# Patient Record
Sex: Male | Born: 1950
Health system: Southern US, Community
[De-identification: ages and names within clinical notes are randomized; demographics above are authoritative.]

## PROBLEM LIST (undated history)

## (undated) DIAGNOSIS — E782 Mixed hyperlipidemia: Secondary | ICD-10-CM

## (undated) DIAGNOSIS — H6121 Impacted cerumen, right ear: Secondary | ICD-10-CM

## (undated) DIAGNOSIS — E785 Hyperlipidemia, unspecified: Secondary | ICD-10-CM

## (undated) DIAGNOSIS — I2510 Atherosclerotic heart disease of native coronary artery without angina pectoris: Secondary | ICD-10-CM

## (undated) DIAGNOSIS — N529 Male erectile dysfunction, unspecified: Secondary | ICD-10-CM

## (undated) DIAGNOSIS — E663 Overweight: Secondary | ICD-10-CM

## (undated) DIAGNOSIS — IMO0001 Reserved for inherently not codable concepts without codable children: Secondary | ICD-10-CM

## (undated) DIAGNOSIS — R9389 Abnormal findings on diagnostic imaging of other specified body structures: Secondary | ICD-10-CM

## (undated) DIAGNOSIS — H579 Unspecified disorder of eye and adnexa: Secondary | ICD-10-CM

## (undated) DIAGNOSIS — M1990 Unspecified osteoarthritis, unspecified site: Secondary | ICD-10-CM

## (undated) DIAGNOSIS — E119 Type 2 diabetes mellitus without complications: Secondary | ICD-10-CM

## (undated) DIAGNOSIS — H269 Unspecified cataract: Secondary | ICD-10-CM

## (undated) DIAGNOSIS — Z720 Tobacco use: Secondary | ICD-10-CM

## (undated) DIAGNOSIS — T7840XA Allergy, unspecified, initial encounter: Secondary | ICD-10-CM

## (undated) DIAGNOSIS — D72829 Elevated white blood cell count, unspecified: Secondary | ICD-10-CM

## (undated) DIAGNOSIS — K219 Gastro-esophageal reflux disease without esophagitis: Secondary | ICD-10-CM

## (undated) DIAGNOSIS — F17200 Nicotine dependence, unspecified, uncomplicated: Secondary | ICD-10-CM

## (undated) DIAGNOSIS — I700 Atherosclerosis of aorta: Secondary | ICD-10-CM

## (undated) DIAGNOSIS — E786 Lipoprotein deficiency: Secondary | ICD-10-CM

## (undated) HISTORY — DX: Atherosclerotic heart disease of native coronary artery without angina pectoris: I25.10

## (undated) HISTORY — DX: Unspecified cataract: H26.9

## (undated) HISTORY — DX: Abnormal findings on diagnostic imaging of other specified body structures: R93.89

## (undated) HISTORY — PX: MOUTH SURGERY: SHX715

## (undated) HISTORY — DX: Tobacco use: Z72.0

## (undated) HISTORY — DX: Allergy, unspecified, initial encounter: T78.40XA

## (undated) HISTORY — DX: Atherosclerosis of aorta: I70.0

## (undated) HISTORY — DX: Mixed hyperlipidemia: E78.2

## (undated) HISTORY — PX: EYE SURGERY: SHX253

## (undated) HISTORY — DX: Type 2 diabetes mellitus without complications: E11.9

## (undated) HISTORY — DX: Hyperlipidemia, unspecified: E78.5

## (undated) HISTORY — DX: Unspecified disorder of eye and adnexa: H57.9

## (undated) HISTORY — DX: Reserved for inherently not codable concepts without codable children: IMO0001

## (undated) HISTORY — DX: Nicotine dependence, unspecified, uncomplicated: F17.200

## (undated) HISTORY — DX: Lipoprotein deficiency: E78.6

## (undated) HISTORY — DX: Overweight: E66.3

## (undated) HISTORY — PX: COLONOSCOPY: SHX174

## (undated) HISTORY — DX: Male erectile dysfunction, unspecified: N52.9

## (undated) HISTORY — DX: Impacted cerumen, right ear: H61.21

## (undated) HISTORY — DX: Elevated white blood cell count, unspecified: D72.829

## (undated) HISTORY — DX: Unspecified osteoarthritis, unspecified site: M19.90

## (undated) HISTORY — DX: Gastro-esophageal reflux disease without esophagitis: K21.9

---

## 2010-09-09 ENCOUNTER — Ambulatory Visit: Payer: Self-pay | Admitting: Family Medicine

## 2010-09-29 ENCOUNTER — Ambulatory Visit: Payer: Self-pay | Admitting: Family Medicine

## 2012-05-17 ENCOUNTER — Encounter: Payer: Self-pay | Admitting: Medical

## 2012-05-17 ENCOUNTER — Ambulatory Visit (INDEPENDENT_AMBULATORY_CARE_PROVIDER_SITE_OTHER): Payer: Managed Care, Other (non HMO) | Admitting: Medical

## 2012-05-17 VITALS — BP 130/80 | HR 60 | Temp 97.5°F | Resp 16 | Wt 188.0 lb

## 2012-05-17 DIAGNOSIS — H6090 Unspecified otitis externa, unspecified ear: Secondary | ICD-10-CM

## 2012-05-17 DIAGNOSIS — H60399 Other infective otitis externa, unspecified ear: Secondary | ICD-10-CM

## 2012-05-17 MED ORDER — SULFAMETHOXAZOLE-TRIMETHOPRIM 800-160 MG PO TABS: 1.0000 | ORAL_TABLET | Freq: Two times a day (BID) | ORAL | Status: AC

## 2012-05-17 MED ORDER — NEOMYCIN-POLYMYXIN-HC 3.5-10000-1 OT SUSP: 4.0000 [drp] | Freq: Four times a day (QID) | OTIC | Status: AC

## 2012-05-17 NOTE — Progress Notes (Signed)
Subjective:  Brent Espinoza is a 61 y.o. male who presents for evaluation of left ear pain. Symptoms have been present for 3 days. He also notes pain in left jaw, swelling and redness of ear. He does not have a history of ear infections. He does not have a history of recent swimming.   Objective:    BP 130/80  Pulse 60  Temp 97.5 F (36.4 C) (Oral)  Resp 16  Wt 188 lb (85.276 kg) General:  alert and cooperative  Right Ear: normal TMs bilaterally and right canal normal  Left Ear: left canal red, inflamed, with purulent discharge and tender with movement of pinna  Mouth:  lips, mucosa, and tongue normal; teeth and gums normal  Neck: no adenopathy, supple, symmetrical, trachea midline and thyroid not enlarged, symmetric, no tenderness/mass/nodules        Assessment and Plan:    Left otitis externa     Treatment: polytrim and bactrim. OTC analgesia as needed. Water exclusion from affected ear until symptoms resolve. Follow up in 5 days if symptoms not improving.

## 2012-05-17 NOTE — Patient Instructions (Signed)
Begin ear drop, 4 drops 4 times daily for a week  Begin Bactrim oral antibiotic, twice daily for 7-10 days  Use tylenol and alternate with ibuprofen every 4-6 hours for pain and inflammation.   Ibuprofen can be 200mg  OTC, 4 tablets at a time.    Call/return if worse or not improving in 3-5 days.

## 2013-10-13 ENCOUNTER — Encounter: Payer: Self-pay | Admitting: Internal Medicine

## 2014-01-25 ENCOUNTER — Ambulatory Visit (INDEPENDENT_AMBULATORY_CARE_PROVIDER_SITE_OTHER): Payer: Managed Care, Other (non HMO) | Admitting: Medical

## 2014-01-25 ENCOUNTER — Telehealth: Payer: Self-pay | Admitting: Family Medicine

## 2014-01-25 ENCOUNTER — Encounter: Payer: Self-pay | Admitting: Medical

## 2014-01-25 VITALS — BP 140/88 | HR 80 | Temp 98.0°F | Resp 18 | Wt 190.0 lb

## 2014-01-25 DIAGNOSIS — T148 Other injury of unspecified body region: Secondary | ICD-10-CM

## 2014-01-25 DIAGNOSIS — W57XXXA Bitten or stung by nonvenomous insect and other nonvenomous arthropods, initial encounter: Secondary | ICD-10-CM

## 2014-01-25 MED ORDER — DOXYCYCLINE HYCLATE 100 MG PO TABS: 100.0000 mg | ORAL_TABLET | Freq: Every day | ORAL | Status: DC

## 2014-01-25 NOTE — Progress Notes (Signed)
Subjective:   Brent Espinoza is a 63 y.o. male who presents for evaluation of tick bite 3 days ago involving the upper extremity, left upper arm.  Removed the tick in entirety that had been there possibly a day.  Rash started 3 days ago. Lesions are red, and raised in texture. Rash has not changed over time. Rash is pruritic. Associated symptoms: none. Patient denies: abdominal pain, arthralgia, fever, headache, irritability, myalgia and nausea. No prior hx/o lyme or RMSF  The following portions of the patient's history were reviewed and updated as appropriate: allergies, current medications, past family history, past medical history, past social history and problem list.  Review of Systems As in subjective above   Objective:   Gen: wd, wn, nad Skin: left upper arm with raised 1cm erythematous inflamed bite wound with central scab, no obvious tick present, no irritation, fluctuance or warmth, no other rash   Assessment:      Encounter Diagnosis  Name Primary?  . Tick bite Yes      Plan:   Discussed symptoms and exam findings.  No obvious lyme or RMSF today.  Prescribed: Doxycycline prophylactic dose  Advised cleaning with soap/water, hydrocortisone cream topically.  Watch for any worse symptoms or signs if not improving.  Discussed signs/symptoms of RMSF and Lyme.   Follow up prn

## 2014-01-25 NOTE — Telephone Encounter (Signed)
Pt called and he is at [facility] on [address].  I think he confused [place] on where to send his rx.  Something about if just one time refill to [facility] but if more to [facility] and there is no way to know if he will need more than one dose.  So sent to [facility] on [address].  This is a one dose of 2 tablets.

## 2014-01-25 NOTE — Patient Instructions (Signed)
Thank you for giving me the opportunity to serve you today.    Your diagnosis today includes: Encounter Diagnosis  Name Primary?  . Tick bite Yes     Specific recommendations today include:  Use over-the-counter hydrocortisone cream topically as directed at the insect bite  Use soap and water to clean the wound regularly, daily  begin doxycycline prophylactic treatment  Watch for any worsening rash or symptoms such as worsening rash, target lesion, fever, nausea, headache, joint pains, significant fatigue  If not improving or new symptom    I have included other useful information below for your review.  Lyme Disease You may have been bitten by a tick and are to watch for the development of Lyme Disease. Lyme Disease is an infection that is caused by a bacteria The bacteria causing this disease is named Borreilia burgdorferi. If a tick is infected with this bacteria and then bites you, then Lyme Disease may occur. These ticks are carried by deer and rodents such as rabbits and mice and infest grassy as well as forested areas. Fortunately most tick bites do not cause Lyme Disease.  Lyme Disease is easier to prevent than to treat. First, covering your legs with clothing when walking in areas where ticks are possibly abundant will prevent their attachment because ticks tend to stay within inches of the ground. Second, using insecticides containing DEET can be applied on skin or clothing. Last, because it takes about 12 to 24 hours for the tick to transmit the disease after attachment to the human host, you should inspect your body for ticks twice a day when you are in areas where Lyme Disease is common. You must look thoroughly when searching for ticks. The Ixodes tick that carries Lyme Disease is very small. It is around the size of a sesame seed (picture of tick is not actual size). Removal is best done by grasping the tick by the head and pulling it out. Do not to squeeze the body of  the tick. This could inject the infecting bacteria into the bite site. Wash the area of the bite with an antiseptic solution after removal.  Lyme Disease is a disease that may affect many body systems. Because of the small size of the biting tick, most people do not notice being bitten. The first sign of an infection is usually a round red rash that extends out from the center of the tick bite. The center of the lesion may be blood colored (hemorrhagic) or have tiny blisters (vesicular). Most lesions have bright red outer borders and partial central clearing. This rash may extend out many inches in diameter, and multiple lesions may be present. Other symptoms such as fatigue, headaches, chills and fever, general achiness and swelling of lymph glands may also occur. If this first stage of the disease is left untreated, these symptoms may gradually resolve by themselves, or progressive symptoms may occur because of spread of infection to other areas of the body.  Follow up with your caregiver to have testing and treatment if you have a tick bite and you develop any of the above complaints. Your caregiver may recommend preventative (prophylactic) medications which kill bacteria (antibiotics). Once a diagnosis of Lyme Disease is made, antibiotic treatment is highly likely to cure the disease. Effective treatment of late stage Lyme Disease may require longer courses of antibiotic therapy.  MAKE SURE YOU:   Understand these instructions.  Will watch your condition.  Will get help right away if you are  not doing well or get worse. Document Released: 01/04/2001 Document Revised: 12/21/2011 Document Reviewed: 03/08/2009 [facility] Patient Information 2014 [place], [place].   [place] Spotted Fever [place] Spotted Fever (RMSF) is the oldest known tick-borne disease of people in the [place]. This disease was named because it was first described among people in the [facility] area who had an  illness characterized by a rash with red-purple-black spots. This disease is caused by a rickettsia (Rickettsia rickettsii), a bacteria carried by the tick. The [facility] wood tick and the American dog tick, acquire and transmit the RMSF bacteria (pictures NOT actual size). When a larval, nymphal or adult tick feeds on an infected rodent or larger animal, the tick can become infected. Infected adult ticks then feed on people who may then get RMSF. The tick transmits the disease to humans during a prolonged period of feeding that lasts many hours, days or even a couple weeks. The bite is painless and frequently goes unnoticed. An infected male tick may also pass the rickettsial bacteria to her eggs that then may mature to be infected adult ticks. The rickettsia that causes RMSF can also get into a person's body through damaged skin. A tick bite is not necessary. People can get RMSF if they crush a tick and get it's blood or body fluids on their skin through a small cut or sore.  DIAGNOSIS Diagnosis is made by laboratory tests.  TREATMENT Treatment is with antibiotics (medications that kill rickettsia and other bacteria). Immediate treatment usually prevents death. GEOGRAPHIC RANGE This disease was reported only in the [facility] until 1931. RMSF has more recently been described among individuals in all states except [place], [place] and [place]. The highest reported incidences of RMSF now occur among residents of [place], [place], [place] and the [place]. TIME OF YEAR  Most cases are diagnosed during late spring and summer when ticks are most active. However, especially in the warmer [place] states, a few cases occur during the winter. SYMPTOMS   Symptoms of RMSF begin from 2 to 14 days after a tick bite. The most common early symptoms are fever, muscle aches and headache followed by nausea (feeling sick to your stomach) or vomiting.  The RMSF rash is typically delayed until 3 or more  days after symptom onset, and eventually develops in 9 of 10 infected patients by the 5th day of illness. If the disease is not treated it can cause death. If you get a fever, headache, muscle aches, rash, nausea or vomiting within 2 weeks of a possible tick bite or exposure you should see your caregiver immediately. PREVENTION Ticks prefer to hide in shady, moist ground litter. They can often be found above the ground clinging to tall grass, brush, shrubs and low tree branches. They also inhabit lawns and gardens, especially at the edges of woodlands and around old stone walls. Within the areas where ticks generally live, no naturally vegetated area can be considered completely free of infected ticks. The best precaution against RMSF is to avoid contact with soil, leaf litter and vegetation as much as possible in tick infested areas. For those who enjoy gardening or walking in their yards, clear brush and mow tall grass around houses and at the edges of gardens. This may help reduce the tick population in the immediate area. Applications of chemical insecticides by a licensed professional in the spring (late May) and Fall (September) will also control ticks, especially in heavily infested areas. Treatment will never get rid  of all the ticks. Getting rid of small animal populations that host ticks will also decrease the tick population. When working in the garden, [employer], or handling soil and vegetation, wear light-colored protective clothing and gloves. Spot-check often to prevent ticks from reaching the skin. Ticks cannot jump or fly. They will not drop from an above-ground perch onto a passing animal. Once a tick gains access to human skin it climbs upward until it reaches a more protected area. For example, the back of the knee, groin, navel, armpit, ears or nape of the neck. It then begins the slow process of embedding itself in the skin. Campers, hikers, field workers, and others who spend time in  wooded, brushy or tall grassy areas can avoid exposure to ticks by using the following precautions:  Wear light-colored clothing with a tight weave to spot ticks more easily and prevent contact with the skin.  Wear long pants tucked into socks, long-sleeved shirts tucked into pants and enclosed shoes or boots along with insect repellent.  Spray clothes with insect repellent containing either DEET or Permethrin. Only DEET can be used on exposed skin. Follow the manufacturer's directions carefully.  Wear a hat and keep long hair pulled back.  Stay on cleared, well-worn trails whenever possible.  Spot-check yourself and others often for the presence of ticks on clothes. If you find one, there are likely to be others. Check thoroughly.  Remove clothes after leaving tick-infested areas. If possible, wash them to eliminate any unseen ticks. Check yourself, your children and any pets from head to toe for the presence of ticks.  Shower and shampoo. You can greatly reduce your chances of contracting RMSF if you remove attached ticks as soon as possible. Regular checks of the body, including all body sites covered by hair (head, armpits, genitals), allow removal of the tick before rickettsial transmission. To remove an attached tick, use a forceps or tweezers to detach the intact tick without leaving mouth parts in the skin. The tick bite wound should be cleansed after tick removal. Remember the most common symptoms of RMSF are fever, muscle aches, headache and nausea or vomiting with a later onset of rash. If you get these symptoms after a tick bite and while living in an area where RMSF is found, RMSF should be suspected. If the disease is not treated, it can cause death. See your caregiver immediately if you get these symptoms. Do this even if not aware of a tick bite. Document Released: 01/10/2001 Document Revised: 12/21/2011 Document Reviewed: 09/02/2009 [facility] Patient Information 2014 [place],  [place].

## 2014-08-21 ENCOUNTER — Encounter: Payer: Self-pay | Admitting: Medical

## 2014-08-21 ENCOUNTER — Ambulatory Visit (INDEPENDENT_AMBULATORY_CARE_PROVIDER_SITE_OTHER): Payer: Managed Care, Other (non HMO) | Admitting: Medical

## 2014-08-21 VITALS — BP 130/78 | HR 88 | Temp 97.7°F | Resp 14 | Wt 188.0 lb

## 2014-08-21 DIAGNOSIS — H65192 Other acute nonsuppurative otitis media, left ear: Secondary | ICD-10-CM

## 2014-08-21 DIAGNOSIS — Z720 Tobacco use: Secondary | ICD-10-CM

## 2014-08-21 DIAGNOSIS — Z23 Encounter for immunization: Secondary | ICD-10-CM

## 2014-08-21 DIAGNOSIS — L989 Disorder of the skin and subcutaneous tissue, unspecified: Secondary | ICD-10-CM

## 2014-08-21 DIAGNOSIS — Z7189 Other specified counseling: Secondary | ICD-10-CM

## 2014-08-21 DIAGNOSIS — F17200 Nicotine dependence, unspecified, uncomplicated: Secondary | ICD-10-CM

## 2014-08-21 MED ORDER — AZITHROMYCIN 250 MG PO TABS: ORAL_TABLET | ORAL | Status: DC

## 2014-08-21 NOTE — Progress Notes (Signed)
Subjective:   Brent Espinoza is a 63 y.o. male who presents with ear pain and possible ear infection. Symptoms include a 1+ week history of ear pain and possibly yellow ear drainage this morning.  He was on a plane 3 weeks ago out to [facility], thinks his symptoms may have got kicked off then. Has a history of otitis externa seen by me 2 years ago,and occasionally gets ear infections. He is a smoker  He has a second complaint of leg skin lesion of his right foot along the back of the heel. This skin lesions been there a week or 2. Has been red and raise up, thought was a little pustule. He tried wart remover for 3 days. No other aggravating or relieving factors.  He smokes about a half a pack a day  Last preventative medical care unknown  ROS as in subjective  Objective:  Filed Vitals:   08/21/14 1503  BP: 130/78  Pulse: 88  Temp: 97.7 F (36.5 C)  Resp: 14    General appearance: Alert, WD/WN, no distress                             Skin: right posterior lateral heel with 1.5 cm area of pink red coloration with central raised hard keratinized core, round raised growth, with some flaking of skin surrounding that, otherwise skin warm, no rash                           Head: no sinus tenderness                            Eyes: conjunctiva normal, corneas clear, PERRLA                            Ears: left TM with erythema, flat, right TM normal, external ear canals normal                          Nose: septum midline, turbinates swollen, with erythema and clear discharge             Mouth/throat: MMM, tongue normal, mild pharyngeal erythema                           Neck: supple, no adenopathy, no thyromegaly, nontender                          Heart: RRR, normal S1, S2, no murmurs                         Lungs: there were some scattered rhonchi in the right lung fields otherwise, no wheezes, rales     Assessment: Encounter Diagnoses  Name Primary?  . Acute nonsuppurative otitis  media of left ear Yes  . Smoker   . Skin lesion   . Flu vaccine need   . Counseling on health promotion and disease prevention   '   Plan: Otitis media-begin Z-Pak, plenty of water intake, suggested symptomatic OTC remedies.  Tylenol or Ibuprofen OTC for fever and malaise.  Call/return in 2-3 days if symptoms aren't resolving.   Smoker - discussed risk of tobacco use, recommended he consider  quitting, not ready to quit  Skin lesion-unclear, wart versus other.  He we use wart remover, her for another week or 2 but advise if not improving or not completely gone within 2 weeks should see dermatology.he will let me know within 2 weeks  Counseled on the influenza virus vaccine.  Vaccine information sheet given.  Influenza vaccine given after consent obtained.  Counseled on the pneumococcal vaccine which she will return to get in a few weeks  Strongly advise he come back for routine physical as he is well behind for this

## 2016-09-18 ENCOUNTER — Telehealth: Payer: Self-pay

## 2016-09-18 NOTE — Telephone Encounter (Signed)
Ok, do referral

## 2016-09-18 NOTE — Telephone Encounter (Signed)
Pt states that he needs authorization to get [facility] referral for eye doctor, Dr. Zadie Rhine. Please advise if ok to enter referral in acuity connects. Please call pt to notify if referral entered.  [phone]. Victorino December

## 2016-09-21 NOTE — Telephone Encounter (Signed)
Acuity referral entered in acuity connects.

## 2016-09-30 DIAGNOSIS — H34811 Central retinal vein occlusion, right eye, with macular edema: Secondary | ICD-10-CM | POA: Diagnosis not present

## 2016-11-16 DIAGNOSIS — H34811 Central retinal vein occlusion, right eye, with macular edema: Secondary | ICD-10-CM | POA: Diagnosis not present

## 2017-01-04 DIAGNOSIS — H35351 Cystoid macular degeneration, right eye: Secondary | ICD-10-CM | POA: Diagnosis not present

## 2017-01-04 DIAGNOSIS — H34811 Central retinal vein occlusion, right eye, with macular edema: Secondary | ICD-10-CM | POA: Diagnosis not present

## 2017-02-18 ENCOUNTER — Ambulatory Visit (INDEPENDENT_AMBULATORY_CARE_PROVIDER_SITE_OTHER): Payer: Medicare HMO | Admitting: Medical

## 2017-02-18 ENCOUNTER — Encounter: Payer: Self-pay | Admitting: Medical

## 2017-02-18 VITALS — BP 118/72 | HR 68 | Ht 68.0 in | Wt 181.2 lb

## 2017-02-18 DIAGNOSIS — Z7189 Other specified counseling: Secondary | ICD-10-CM | POA: Diagnosis not present

## 2017-02-18 DIAGNOSIS — Z125 Encounter for screening for malignant neoplasm of prostate: Secondary | ICD-10-CM | POA: Diagnosis not present

## 2017-02-18 DIAGNOSIS — Z0000 Encounter for general adult medical examination without abnormal findings: Secondary | ICD-10-CM | POA: Diagnosis not present

## 2017-02-18 DIAGNOSIS — Z131 Encounter for screening for diabetes mellitus: Secondary | ICD-10-CM | POA: Diagnosis not present

## 2017-02-18 DIAGNOSIS — Z1211 Encounter for screening for malignant neoplasm of colon: Secondary | ICD-10-CM | POA: Diagnosis not present

## 2017-02-18 DIAGNOSIS — Z122 Encounter for screening for malignant neoplasm of respiratory organs: Secondary | ICD-10-CM

## 2017-02-18 DIAGNOSIS — E663 Overweight: Secondary | ICD-10-CM | POA: Diagnosis not present

## 2017-02-18 DIAGNOSIS — H579 Unspecified disorder of eye and adnexa: Secondary | ICD-10-CM | POA: Insufficient documentation

## 2017-02-18 DIAGNOSIS — Z13220 Encounter for screening for lipoid disorders: Secondary | ICD-10-CM | POA: Diagnosis not present

## 2017-02-18 DIAGNOSIS — Z720 Tobacco use: Secondary | ICD-10-CM

## 2017-02-18 DIAGNOSIS — H6121 Impacted cerumen, right ear: Secondary | ICD-10-CM

## 2017-02-18 DIAGNOSIS — Z136 Encounter for screening for cardiovascular disorders: Secondary | ICD-10-CM | POA: Insufficient documentation

## 2017-02-18 DIAGNOSIS — Z7185 Encounter for immunization safety counseling: Secondary | ICD-10-CM

## 2017-02-18 HISTORY — DX: Tobacco use: Z72.0

## 2017-02-18 HISTORY — DX: Unspecified disorder of eye and adnexa: H57.9

## 2017-02-18 HISTORY — DX: Overweight: E66.3

## 2017-02-18 HISTORY — DX: Impacted cerumen, right ear: H61.21

## 2017-02-18 LAB — CBC
HEMATOCRIT: 45.6 % (ref 38.5–50.0)
Hemoglobin: 16.1 g/dL (ref 13.2–17.1)
MCH: 30.7 pg (ref 27.0–33.0)
MCHC: 35.3 g/dL (ref 32.0–36.0)
MCV: 86.9 fL (ref 80.0–100.0)
MPV: 9 fL (ref 7.5–12.5)
Platelets: 257 10*3/uL (ref 140–400)
RBC: 5.25 MIL/uL (ref 4.20–5.80)
RDW: 14.1 % (ref 11.0–15.0)
WBC: 13.2 10*3/uL — ABNORMAL HIGH (ref 4.0–10.5)

## 2017-02-18 NOTE — Progress Notes (Signed)
Subjective:    Brent Espinoza is a 66 y.o. male who presents for Preventative Services visit and chronic medical problems/med check visit.    Primary Care Provider Dorothea Ogle, PA-C here for primary care  Current Health Care Team:  Dentist, Dr. Victorino Dike  Eye doctor, Dr. Delman Cheadle and Dr. Zadie Rhine at [facility]  Dr. Allyn Kenner dermatology  Medical Services you may have received from other than [facility] providers in the past year (date may be approximate) Dermatology, eye doctor  Exercise Current exercise habits: some walking   Nutrition/Diet Current diet: in general, a "healthy" diet    Depression Screen Depression screen [facility] 2/9 02/18/2017  Decreased Interest 0  Down, Depressed, Hopeless 0  PHQ - 2 Score 0    Activities of Daily Living Screen/Functional Status Survey Is the patient deaf or have difficulty hearing?: No Does the patient have difficulty seeing, even when wearing glasses/contacts?: No Does the patient have difficulty concentrating, remembering, or making decisions?: No Does the patient have difficulty walking or climbing stairs?: No Does the patient have difficulty dressing or bathing?: No Does the patient have difficulty doing errands alone such as visiting a doctor's office or shopping?: No   Fall Risk Screen Fall Risk  02/18/2017  Falls in the past year? No    Gait Assessment: Normal gait observed yes  Advanced directives Does patient have a [place]? No Does patient have a Living Will? No  History reviewed. No pertinent past medical history.  Past Surgical History:  Procedure Laterality Date  . COLONOSCOPY     never as of 02/2017  . EYE SURGERY     gets therapy injections for vessel disease, hx/o cataract surgery    Social History   Social History  . Marital status: Divorced    Spouse name: N/A  . Number of children: N/A  . Years of education: N/A   Occupational History  . Not on file.   Social History Main Topics    . Smoking status: Current Every Day Smoker    Packs/day: 1.00    Years: 50.00    Types: Cigarettes  . Smokeless tobacco: Never Used  . Alcohol use Yes     Comment: occ  . Drug use: No  . Sexual activity: Not on file   Other Topics Concern  . Not on file   Social History Narrative   Alone.   Retired as of 2018.   Was at [employer].   Exercise - yard work, Stage manager around.  Eats relatively healthy.   Has 2 children, 1 in [place], 1 in [place], 4 grandchildren.  02/2017    Family History  Problem Relation Age of Onset  . Other Mother        eye disease, died of old age  . Other Father        health history unknown  . Cancer Neg Hx   . Diabetes Neg Hx   . Heart disease Neg Hx   . Stroke Neg Hx   . Hyperlipidemia Neg Hx   . Hypertension Neg Hx      Current Outpatient Prescriptions:  .  b complex vitamins capsule, Take 1 capsule by mouth daily., Disp: , Rfl:  .  naproxen sodium (ANAPROX) 220 MG tablet, Take 220 mg by mouth 2 (two) times daily with a meal., Disp: , Rfl:   Allergies  Allergen Reactions  . Penicillins     History reviewed: allergies, current medications, past family history, past medical history,  past social history, past surgical history and problem list  Chronic issues discussed: none  Acute issues discussed: Ear wax  Objective:      Biometrics BP 118/72   Pulse 68   Ht 5\' 8"  (1.727 m)   Wt 181 lb 3.2 oz (82.2 kg)   SpO2 96%   BMI 27.55 kg/m   Cognitive Testing  Alert? Yes  Normal Appearance?Yes  Oriented to person? Yes  Place? Yes   Time? Yes  Recall of three objects?  Yes  Can perform simple calculations? Yes  Displays appropriate judgment?Yes  Can read the correct time from a watch face?Yes  General appearance: alert, no distress, WD/WN, white male  Nutritional Status: Inadequate calore intake? no Loss of muscle mass? no Loss of fat beneath skin? no Localized or general edema? no Diminished functional status? no  Other  pertinent exam: HEENT: normocephalic, sclerae anicteric, impacted cerumen on right, moderate cerumen left, unable to visualize TMs, nares patent, no discharge or erythema, pharynx normal Skin: scattered macules, SK on right supraspinatus region and left upper back, no specific worrisome lesions, scar left cheek from prior biospy Oral cavity: MMM, no lesions Neck: supple, no lymphadenopathy, no thyromegaly, no masses, no bruits Heart: RRR, normal S1, S2, no murmurs Lungs: CTA bilaterally, no wheezes, rhonchi, or rales Abdomen: +bs, soft, non tender, non distended, no masses, no hepatomegaly, no splenomegaly Musculoskeletal: non tender, no swelling, no obvious deformity Extremities: no edema, no cyanosis, no clubbing Pulses: 2+ symmetric, upper and lower extremities, normal cap refill Neurological: alert, oriented x 3, CN2-12 intact, strength normal upper extremities and lower extremities, sensation normal throughout, DTRs 2+ throughout, no cerebellar signs, gait normal Psychiatric: normal affect, behavior normal, pleasant  GU: normal male, uncircumcised, no mass, no hernia, no lymphadenopathy DRE: refused  Assessment:   Encounter Diagnoses  Name Primary?  . Initial Medicare annual wellness visit Yes  . Eye disease   . Tobacco use   . Screening for diabetes mellitus   . Screening for lipid disorders   . Screen for colon cancer   . Screening for prostate cancer   . Vaccine counseling   . Overweight   . Screening for heart disease   . Screening for AAA (abdominal aortic aneurysm)   . Encounter for screening for lung cancer   . Impacted cerumen of right ear      Plan:   A preventative services visit was completed today.  During the course of the visit today, we discussed and counseled about appropriate screening and preventive services.  A health risk assessment was established today that included a review of current medications, allergies, social history, family history, medical and  preventative health history, biometrics, and preventative screenings to identify potential safety concerns or impairments.  A personalized plan was printed today for your records and use.   Personalized health advice and education was given today to reduce health risks and promote self management and wellness.  Information regarding end of life planning was discussed today.  Conditions/risks identified: Impacted cerumen  Chronic problems discussed today: Retinal/eye issues - managed by ophthalmoloty Tobacco use - advised cessation but he is not desiring to quit despite risks  Acute problems discussed today: Ear wax - return for lavage, gave some recommendations on home remedies as well  Recommendations:  I recommend a yearly ophthalmology/optometry visit for glaucoma screening and eye checkup  I recommended a yearly dental visit for hygiene and checkup  Advanced directives - discussed nature and purpose of Advanced Directives,  encouraged them to complete them if they have not done so and/or encouraged them to get [place] a copy if they have done this already.  Today we performed screening for heart with EKG, labs for diabetes and lipid screening  referral for AAA [place]  He declines colonoscopy, so referral for col guard  Prostate cancer screening discussed and performed today except rectal exam  Referrals today: AAA screening Cologuard  Immunizations: I recommended a yearly influenza vaccine, typically in September when the vaccine is usually available Is the Pneumococcal vaccine up to date: maybe, will request pharmacy records. Is the Shingles vaccine up to date: no.  Advised shingrix.  He will check insurance coverage Is the Td/Tdap vaccine up to date: yes, <10 years per patient   Medicare Attestation A preventative services visit was completed today.  During the course of the visit the patient was educated and counseled about appropriate screening and preventive services.  A  health risk assessment was established with the patient that included a review of current medications, allergies, social history, family history, medical and preventative health history, biometrics, and preventative screenings to identify potential safety concerns or impairments.  A personalized plan was printed today for the patient's records and use.   Personalized health advice and education was given today to reduce health risks and promote self management and wellness.  Information regarding end of life planning was discussed today.  Crisoforo Oxford, PA-C   02/18/2017

## 2017-02-19 ENCOUNTER — Ambulatory Visit (INDEPENDENT_AMBULATORY_CARE_PROVIDER_SITE_OTHER): Payer: Medicare HMO | Admitting: Medical

## 2017-02-19 ENCOUNTER — Encounter: Payer: Self-pay | Admitting: Medical

## 2017-02-19 ENCOUNTER — Other Ambulatory Visit: Payer: Self-pay | Admitting: Medical

## 2017-02-19 DIAGNOSIS — E782 Mixed hyperlipidemia: Secondary | ICD-10-CM

## 2017-02-19 DIAGNOSIS — E786 Lipoprotein deficiency: Secondary | ICD-10-CM | POA: Insufficient documentation

## 2017-02-19 DIAGNOSIS — H6121 Impacted cerumen, right ear: Secondary | ICD-10-CM

## 2017-02-19 DIAGNOSIS — Z720 Tobacco use: Secondary | ICD-10-CM

## 2017-02-19 DIAGNOSIS — D72829 Elevated white blood cell count, unspecified: Secondary | ICD-10-CM | POA: Insufficient documentation

## 2017-02-19 HISTORY — DX: Mixed hyperlipidemia: E78.2

## 2017-02-19 HISTORY — DX: Lipoprotein deficiency: E78.6

## 2017-02-19 HISTORY — DX: Elevated white blood cell count, unspecified: D72.829

## 2017-02-19 LAB — HEMOGLOBIN A1C
HEMOGLOBIN A1C: 5.8 % — AB (ref <5.7)
MEAN PLASMA GLUCOSE: 120 mg/dL

## 2017-02-19 LAB — LIPID PANEL
Cholesterol: 151 mg/dL (ref <200)
HDL: 28 mg/dL — ABNORMAL LOW (ref >40)
LDL CALC: 111 mg/dL — AB (ref <100)
TRIGLYCERIDES: 61 mg/dL (ref <150)
Total CHOL/HDL Ratio: 5.4 Ratio — ABNORMAL HIGH (ref <5.0)
VLDL: 12 mg/dL (ref <30)

## 2017-02-19 LAB — COMPREHENSIVE METABOLIC PANEL
ALBUMIN: 4.2 g/dL (ref 3.6–5.1)
ALK PHOS: 63 U/L (ref 40–115)
ALT: 17 U/L (ref 9–46)
AST: 22 U/L (ref 10–35)
BILIRUBIN TOTAL: 0.8 mg/dL (ref 0.2–1.2)
BUN: 13 mg/dL (ref 7–25)
CALCIUM: 9.1 mg/dL (ref 8.6–10.3)
CO2: 21 mmol/L (ref 20–31)
Chloride: 100 mmol/L (ref 98–110)
Creat: 0.93 mg/dL (ref 0.70–1.25)
Glucose, Bld: 70 mg/dL (ref 65–99)
Potassium: 4.6 mmol/L (ref 3.5–5.3)
Sodium: 137 mmol/L (ref 135–146)
Total Protein: 7 g/dL (ref 6.1–8.1)

## 2017-02-19 LAB — PSA: PSA: 1.7 ng/mL (ref <=4.0)

## 2017-02-19 MED ORDER — PRAVASTATIN SODIUM 20 MG PO TABS: 20.0000 mg | ORAL_TABLET | Freq: Every day | ORAL | 0 refills | Status: DC

## 2017-02-19 MED ORDER — ASPIRIN EC 81 MG PO TBEC: 81.0000 mg | DELAYED_RELEASE_TABLET | Freq: Every day | ORAL | 3 refills | Status: DC

## 2017-02-19 NOTE — Patient Instructions (Addendum)
  Thank you for giving me the opportunity to serve you today.    Your diagnosis today includes: Encounter Diagnoses  Name Primary?  . Initial Medicare annual wellness visit Yes  . Eye disease   . Tobacco use   . Screening for diabetes mellitus   . Screening for lipid disorders   . Screen for colon cancer   . Screening for prostate cancer   . Vaccine counseling   . Overweight   . Screening for heart disease   . Screening for AAA (abdominal aortic aneurysm)   . Encounter for screening for lung cancer   . Impacted cerumen of right ear     Recommendations:  I recommend a yearly ophthalmology/optometry visit for glaucoma screening and eye checkup  I recommended a yearly dental visit for hygiene and checkup  I recommend you have your attorney update your Last Will and Testament as well as prepare Advanced Directives including a Living Will and [place]  We will refer you for ultrasound to screen for Abdominal Aortic Aneurysm  We are referring you for Cologuard testing for Colon Cancer screening   Immunizations: I recommended a yearly influenza vaccine, typically in September when the vaccine is usually available I recommend you check insurance coverage for Pneumococcal and Shingrix vaccines Please get [place] a copy/date of the prior Pneumococcal vaccine you had at the pharmacy last year  Typically you get Prevnar 75 at age 69yo, then you get Pneumococcal 23 at age 23yo.  Eat a healthy low fat diet, get routine exercise  STOP SMOKING!

## 2017-02-19 NOTE — Progress Notes (Signed)
Subjective:   Here for complaint of earwax buildup in both ears, worse in right.  Symptoms include decreased hearing on the right.  He does report prior history of similar.  No other aggravating or relieving factors.    He to review lab results from yesterday  No other complaint.  Review of Systems Constitutional: denies fever, chills, sweats ENT: no runny nose, ear pain, sore throat, hoarseness, sinus pain, teeth pain, tinnitus Gastroenterology: denies nausea, vomiting     Objective:   Physical Exam  General appearance: alert, no distress, WD/WN Ears: right ear canal with impacted cerumen, left ear canal with moderate cerumen HENT: conjunctiva/corneas normal, sclerae anicteric, nares patent, no discharge or erythema, pharynx normal Oral cavity: MMM, tongue normal, teeth normal Neurological: Hearing normal bilaterally to whisper    Assessment & Plan:    Encounter Diagnoses  Name Primary?  . Mixed dyslipidemia Yes  . Tobacco use   . HDL deficiency   . Impacted cerumen of right ear     Reviewed labs from yesterday, recommendations, including beginning ASA and Pravachol QHS, WBC elevated, and at risk for diabetes.   discussed diet, exercise, and recommended he stop smoking.  Discussed findings.  Discussed risk/benefits of procedure and patient agrees to procedure. Successfully used warm water lavage to remove impacted cerumen from bilat ear canal. Patient tolerated procedure well. Advised they avoid using any cotton swabs or other devices to clean the ear canals.  Use basic hygiene as discussed.  Follow up prn.

## 2017-02-22 DIAGNOSIS — H34811 Central retinal vein occlusion, right eye, with macular edema: Secondary | ICD-10-CM | POA: Diagnosis not present

## 2017-02-22 DIAGNOSIS — H2512 Age-related nuclear cataract, left eye: Secondary | ICD-10-CM | POA: Diagnosis not present

## 2017-02-22 DIAGNOSIS — H35351 Cystoid macular degeneration, right eye: Secondary | ICD-10-CM | POA: Diagnosis not present

## 2017-02-22 DIAGNOSIS — H4720 Unspecified optic atrophy: Secondary | ICD-10-CM | POA: Diagnosis not present

## 2017-03-03 DIAGNOSIS — Z1212 Encounter for screening for malignant neoplasm of rectum: Secondary | ICD-10-CM | POA: Diagnosis not present

## 2017-03-03 DIAGNOSIS — Z1211 Encounter for screening for malignant neoplasm of colon: Secondary | ICD-10-CM | POA: Diagnosis not present

## 2017-03-09 ENCOUNTER — Ambulatory Visit
Admission: RE | Admit: 2017-03-09 | Discharge: 2017-03-09 | Disposition: A | Discharge status: Home / Self Care | Payer: Medicare HMO | Source: Ambulatory Visit | Attending: Medical | Admitting: Medical

## 2017-03-09 DIAGNOSIS — Z720 Tobacco use: Secondary | ICD-10-CM

## 2017-03-09 DIAGNOSIS — Z0000 Encounter for general adult medical examination without abnormal findings: Secondary | ICD-10-CM

## 2017-03-09 DIAGNOSIS — F17210 Nicotine dependence, cigarettes, uncomplicated: Secondary | ICD-10-CM | POA: Diagnosis not present

## 2017-03-09 DIAGNOSIS — Z122 Encounter for screening for malignant neoplasm of respiratory organs: Secondary | ICD-10-CM

## 2017-03-10 LAB — COLOGUARD: COLOGUARD: POSITIVE

## 2017-03-11 ENCOUNTER — Telehealth: Payer: Self-pay | Admitting: Medical

## 2017-03-11 ENCOUNTER — Encounter: Payer: Self-pay | Admitting: Family Medicine

## 2017-03-11 ENCOUNTER — Other Ambulatory Visit: Payer: Self-pay

## 2017-03-11 DIAGNOSIS — R195 Other fecal abnormalities: Secondary | ICD-10-CM

## 2017-03-11 NOTE — Telephone Encounter (Signed)
Pt was notified of results and sent referral

## 2017-03-11 NOTE — Telephone Encounter (Signed)
Unfortunately Cologuard test result was positive.  This can indicate the presence of colorectal cancer or bleeding.   Thus, I recommend referral to gastroenterology at this time.   Please refer to gastroenterology for further evaluations.   (see if they have seen GI prior or have a preference for referral).    

## 2017-03-15 ENCOUNTER — Encounter: Payer: Self-pay | Admitting: Internal Medicine

## 2017-03-18 ENCOUNTER — Telehealth: Payer: Self-pay | Admitting: Medical

## 2017-03-18 NOTE — Telephone Encounter (Signed)
Called andl/m for pt to call us back. 

## 2017-03-18 NOTE — Telephone Encounter (Signed)
Pt called stating that he received our letter asking him to contact [facility] for a Colonoscopy. Pt wanted to let us know that he has decided not to get a colonoscopy right now since he recently had a cologuard. Pt said that cologuard was was pretty good so he will not do anything else right now.

## 2017-03-18 NOTE — Telephone Encounter (Signed)
Can she have a refill on this 

## 2017-03-18 NOTE — Telephone Encounter (Signed)
Yes he needs a colonoscopy since his cologuard screen was + positive!  That means there is something causing abnormal cells on the screen, possibly blood, possibly tumor.

## 2017-03-19 NOTE — Telephone Encounter (Signed)
Spoke with patient about pt he said that he doesn't want to go having further testing at this time he , have heard some bad thing , and doesn't want to pursue at this time.

## 2017-03-24 NOTE — Telephone Encounter (Signed)
Brent Espinoza - I spoke to patient about chest CT and cologurard as below.  He mentioned that you were working on referrals for him to Dr. Delman Cheadle and dermatology.  Can you check on this.  FYI I spoke to him about his negative chest CT results.  Discussed + Cologuard.  He plans to do the colonoscopy after I talked to him about results and the procedure, however, he wants to wait just a little bit due to other things going on at home.  He will call me back when ready to pursue this.

## 2017-04-06 ENCOUNTER — Ambulatory Visit (HOSPITAL_COMMUNITY)
Admission: RE | Admit: 2017-04-06 | Discharge: 2017-04-06 | Disposition: A | Discharge status: Home / Self Care | Payer: Medicare HMO | Source: Ambulatory Visit | Attending: Vascular Surgery | Admitting: Vascular Surgery

## 2017-04-06 DIAGNOSIS — Z136 Encounter for screening for cardiovascular disorders: Secondary | ICD-10-CM | POA: Insufficient documentation

## 2017-04-06 DIAGNOSIS — I708 Atherosclerosis of other arteries: Secondary | ICD-10-CM | POA: Diagnosis not present

## 2017-04-26 DIAGNOSIS — H353114 Nonexudative age-related macular degeneration, right eye, advanced atrophic with subfoveal involvement: Secondary | ICD-10-CM | POA: Diagnosis not present

## 2017-04-26 DIAGNOSIS — H43821 Vitreomacular adhesion, right eye: Secondary | ICD-10-CM | POA: Diagnosis not present

## 2017-04-26 DIAGNOSIS — H35351 Cystoid macular degeneration, right eye: Secondary | ICD-10-CM | POA: Diagnosis not present

## 2017-04-26 DIAGNOSIS — H34811 Central retinal vein occlusion, right eye, with macular edema: Secondary | ICD-10-CM | POA: Diagnosis not present

## 2017-05-13 ENCOUNTER — Encounter: Payer: Self-pay | Admitting: Medical

## 2017-05-27 ENCOUNTER — Other Ambulatory Visit: Payer: Self-pay | Admitting: Medical

## 2017-06-15 DIAGNOSIS — H353114 Nonexudative age-related macular degeneration, right eye, advanced atrophic with subfoveal involvement: Secondary | ICD-10-CM | POA: Diagnosis not present

## 2017-06-15 DIAGNOSIS — H35371 Puckering of macula, right eye: Secondary | ICD-10-CM | POA: Diagnosis not present

## 2017-06-15 DIAGNOSIS — H34811 Central retinal vein occlusion, right eye, with macular edema: Secondary | ICD-10-CM | POA: Diagnosis not present

## 2017-06-15 DIAGNOSIS — H34821 Venous engorgement, right eye: Secondary | ICD-10-CM | POA: Diagnosis not present

## 2017-06-15 DIAGNOSIS — H35351 Cystoid macular degeneration, right eye: Secondary | ICD-10-CM | POA: Diagnosis not present

## 2017-06-15 DIAGNOSIS — H43821 Vitreomacular adhesion, right eye: Secondary | ICD-10-CM | POA: Diagnosis not present

## 2017-07-02 ENCOUNTER — Telehealth: Payer: Self-pay | Admitting: Medical

## 2017-07-02 NOTE — Telephone Encounter (Signed)
Patient wants to make sure referrals to Dr, Katy Fitch and Dr [place] Crane have been done He states he asked about these at last appointment

## 2017-07-02 NOTE — Telephone Encounter (Signed)
Louretta Shorten, see last phone messages in chart.  Maybe something fell through the cracks, but please inquire  Also, he is due for CPX in general

## 2017-07-02 NOTE — Telephone Encounter (Signed)
Called dr.gould office and they close early on fridays

## 2017-07-05 NOTE — Telephone Encounter (Signed)
Pt called back again said was told he would be called back today regarding his referrals and he would like to be called back today.

## 2017-07-05 NOTE — Telephone Encounter (Signed)
Pt is aware of appt

## 2017-07-05 NOTE — Telephone Encounter (Signed)
Called and set up an appt for dr.hall on 07/13/2017 @ 10:30am

## 2017-07-13 DIAGNOSIS — L821 Other seborrheic keratosis: Secondary | ICD-10-CM | POA: Diagnosis not present

## 2017-07-15 ENCOUNTER — Encounter: Payer: Self-pay | Admitting: *Deleted

## 2017-08-03 DIAGNOSIS — H353114 Nonexudative age-related macular degeneration, right eye, advanced atrophic with subfoveal involvement: Secondary | ICD-10-CM | POA: Diagnosis not present

## 2017-08-03 DIAGNOSIS — H35351 Cystoid macular degeneration, right eye: Secondary | ICD-10-CM | POA: Diagnosis not present

## 2017-08-03 DIAGNOSIS — H34811 Central retinal vein occlusion, right eye, with macular edema: Secondary | ICD-10-CM | POA: Diagnosis not present

## 2017-09-27 ENCOUNTER — Other Ambulatory Visit: Payer: Self-pay

## 2017-09-27 ENCOUNTER — Telehealth: Payer: Self-pay | Admitting: Medical

## 2017-09-27 DIAGNOSIS — H4720 Unspecified optic atrophy: Secondary | ICD-10-CM | POA: Diagnosis not present

## 2017-09-27 DIAGNOSIS — H353114 Nonexudative age-related macular degeneration, right eye, advanced atrophic with subfoveal involvement: Secondary | ICD-10-CM | POA: Diagnosis not present

## 2017-09-27 DIAGNOSIS — H35351 Cystoid macular degeneration, right eye: Secondary | ICD-10-CM | POA: Diagnosis not present

## 2017-09-27 DIAGNOSIS — H2512 Age-related nuclear cataract, left eye: Secondary | ICD-10-CM | POA: Diagnosis not present

## 2017-09-27 DIAGNOSIS — H34811 Central retinal vein occlusion, right eye, with macular edema: Secondary | ICD-10-CM | POA: Diagnosis not present

## 2017-09-27 DIAGNOSIS — H35371 Puckering of macula, right eye: Secondary | ICD-10-CM | POA: Diagnosis not present

## 2017-09-27 MED ORDER — PRAVASTATIN SODIUM 20 MG PO TABS: 20.0000 mg | ORAL_TABLET | Freq: Every day | ORAL | 0 refills | Status: DC

## 2017-09-27 NOTE — Telephone Encounter (Signed)
Spoke with pt about refill on meds.

## 2017-09-27 NOTE — Telephone Encounter (Signed)
Pt stopped by questioning if he should continue taking Pravastatin 20 mg. If so, he need a refill sent to [place] [facility]. Pt would like a phone call to inform him if he need the med or not.

## 2017-12-01 DIAGNOSIS — H35351 Cystoid macular degeneration, right eye: Secondary | ICD-10-CM | POA: Diagnosis not present

## 2017-12-01 DIAGNOSIS — H34811 Central retinal vein occlusion, right eye, with macular edema: Secondary | ICD-10-CM | POA: Diagnosis not present

## 2017-12-01 DIAGNOSIS — H43811 Vitreous degeneration, right eye: Secondary | ICD-10-CM | POA: Diagnosis not present

## 2017-12-01 DIAGNOSIS — H35371 Puckering of macula, right eye: Secondary | ICD-10-CM | POA: Diagnosis not present

## 2017-12-30 ENCOUNTER — Telehealth: Payer: Self-pay | Admitting: Medical

## 2017-12-30 ENCOUNTER — Other Ambulatory Visit: Payer: Self-pay | Admitting: Medical

## 2017-12-30 MED ORDER — PRAVASTATIN SODIUM 20 MG PO TABS: 20.0000 mg | ORAL_TABLET | Freq: Every day | ORAL | 0 refills | Status: DC

## 2017-12-30 NOTE — Telephone Encounter (Signed)
Pt needs refill Pravastatin, and I advised he needs appt and he states was told last time that you didn't need to see him.  He says he will make appt for yearly CPE for 5/19

## 2018-02-07 DIAGNOSIS — H4720 Unspecified optic atrophy: Secondary | ICD-10-CM | POA: Diagnosis not present

## 2018-02-07 DIAGNOSIS — H353114 Nonexudative age-related macular degeneration, right eye, advanced atrophic with subfoveal involvement: Secondary | ICD-10-CM | POA: Diagnosis not present

## 2018-02-07 DIAGNOSIS — H34811 Central retinal vein occlusion, right eye, with macular edema: Secondary | ICD-10-CM | POA: Diagnosis not present

## 2018-02-07 DIAGNOSIS — H35351 Cystoid macular degeneration, right eye: Secondary | ICD-10-CM | POA: Diagnosis not present

## 2018-02-22 ENCOUNTER — Ambulatory Visit (INDEPENDENT_AMBULATORY_CARE_PROVIDER_SITE_OTHER): Payer: Medicare HMO | Admitting: Medical

## 2018-02-22 ENCOUNTER — Encounter: Payer: Self-pay | Admitting: Medical

## 2018-02-22 VITALS — BP 130/80 | HR 60 | Temp 97.8°F | Ht 67.25 in | Wt 168.8 lb

## 2018-02-22 DIAGNOSIS — D72829 Elevated white blood cell count, unspecified: Secondary | ICD-10-CM

## 2018-02-22 DIAGNOSIS — E782 Mixed hyperlipidemia: Secondary | ICD-10-CM

## 2018-02-22 DIAGNOSIS — R195 Other fecal abnormalities: Secondary | ICD-10-CM | POA: Diagnosis not present

## 2018-02-22 DIAGNOSIS — Z122 Encounter for screening for malignant neoplasm of respiratory organs: Secondary | ICD-10-CM | POA: Diagnosis not present

## 2018-02-22 DIAGNOSIS — Z7185 Encounter for immunization safety counseling: Secondary | ICD-10-CM

## 2018-02-22 DIAGNOSIS — Z7189 Other specified counseling: Secondary | ICD-10-CM

## 2018-02-22 DIAGNOSIS — Z0000 Encounter for general adult medical examination without abnormal findings: Secondary | ICD-10-CM | POA: Diagnosis not present

## 2018-02-22 DIAGNOSIS — Z720 Tobacco use: Secondary | ICD-10-CM | POA: Diagnosis not present

## 2018-02-22 NOTE — Progress Notes (Signed)
Subjective:    Brent Espinoza is a 67 y.o. male who presents for Preventative Services visit and chronic medical problems/med check visit.    Primary Care Provider Dorothea Ogle, PA-C here for primary care  Current Health Care Team:  Dentist, Dr. Kelton Pillar  Eye doctor, Dr. Zadie Rhine  Medical Services you may have received from other than [facility] providers in the past year (date may be approximate) Eye specialist  Exercise Current exercise habits: Home exercise routine includes walking.   Nutrition/Diet Current diet: well balanced  Depression Screen Depression screen PHQ 2/9 02/22/2018  Decreased Interest 0  Down, Depressed, Hopeless -  PHQ - 2 Score 0    Activities of Daily Living Screen/Functional Status Survey Is the patient deaf or have difficulty hearing?: No Does the patient have difficulty seeing, even when wearing glasses/contacts?: No Does the patient have difficulty concentrating, remembering, or making decisions?: No Does the patient have difficulty dressing or bathing?: No Does the patient have difficulty doing errands alone such as visiting a doctor's office or shopping?: No  Can patient draw a clock face showing 3:15 o'clock, yes  Fall Risk Screen Fall Risk  02/22/2018 02/18/2017  Falls in the past year? No No    Gait Assessment: Normal gait observed: yes  Advanced directives Does patient have a [place]? Yes Does patient have a Living Will? No  No past medical history on file.  Past Surgical History:  Procedure Laterality Date  . COLONOSCOPY     never as of 02/2017  . EYE SURGERY     gets therapy injections for vessel disease, hx/o cataract surgery    Social History   Socioeconomic History  . Marital status: Divorced    Spouse name: Not on file  . Number of children: Not on file  . Years of education: Not on file  . Highest education level: Not on file  Occupational History  . Not on file  Social Needs  . Financial  resource strain: Not on file  . Food insecurity:    Worry: Not on file    Inability: Not on file  . Transportation needs:    Medical: Not on file    Non-medical: Not on file  Tobacco Use  . Smoking status: Current Every Day Smoker    Packs/day: 1.00    Years: 50.00    Pack years: 50.00    Types: Cigarettes  . Smokeless tobacco: Never Used  Substance and Sexual Activity  . Alcohol use: Yes    Comment: occ  . Drug use: No  . Sexual activity: Not on file  Lifestyle  . Physical activity:    Days per week: Not on file    Minutes per session: Not on file  . Stress: Not on file  Relationships  . Social connections:    Talks on phone: Not on file    Gets together: Not on file    Attends religious service: Not on file    Active member of club or organization: Not on file    Attends meetings of clubs or organizations: Not on file    Relationship status: Not on file  . Intimate partner violence:    Fear of current or ex partner: Not on file    Emotionally abused: Not on file    Physically abused: Not on file    Forced sexual activity: Not on file  Other Topics Concern  . Not on file  Social History Narrative   Alone.  Retired as of 2018.   Was at [employer].   Exercise - yard work, Stage manager around.  Eats relatively healthy.   Has 2 children, 1 in [place], 1 in [place], 4 grandchildren.  02/2017    Family History  Problem Relation Age of Onset  . Other Mother        eye disease, died of old age  . Other Father        health history unknown  . Cancer Neg Hx   . Diabetes Neg Hx   . Heart disease Neg Hx   . Stroke Neg Hx   . Hyperlipidemia Neg Hx   . Hypertension Neg Hx      Current Outpatient Medications:  .  aspirin EC 81 MG tablet, Take 1 tablet (81 mg total) by mouth daily., Disp: 90 tablet, Rfl: 3 .  pravastatin (PRAVACHOL) 20 MG tablet, Take 1 tablet (20 mg total) by mouth at bedtime., Disp: 90 tablet, Rfl: 0  Allergies  Allergen Reactions  . Penicillins      History reviewed: allergies, current medications, past family history, past medical history, past social history, past surgical history and problem list  Chronic issues discussed: Has had intentional weight loss this past year.   Acute issues discussed: none  Objective:      Biometrics BP 130/80   Pulse 60   Temp 97.8 F (36.6 C) (Oral)   Ht 5' 7.25" (1.708 m)   Wt 168 lb 12.8 oz (76.6 kg)   SpO2 96%   BMI 26.24 kg/m    BP Readings from Last 3 Encounters:  02/22/18 130/80  02/18/17 118/72  08/21/14 130/78   Wt Readings from Last 3 Encounters:  02/22/18 168 lb 12.8 oz (76.6 kg)  02/18/17 181 lb 3.2 oz (82.2 kg)  08/21/14 188 lb (85.3 kg)     Cognitive Testing  Alert? Yes  Normal Appearance?Yes  Oriented to person? Yes  Place? Yes   Time? Yes  Recall of three objects?  Yes  Can perform simple calculations? Yes  Displays appropriate judgment?Yes  Can read the correct time from a watch face?Yes  General appearance: alert, no distress, WD/WN, white male  Nutritional Status: Inadequate calore intake? no Loss of muscle mass? no Loss of fat beneath skin? no Localized or general edema? no Diminished functional status? no  Other pertinent exam: HEENT: normocephalic, sclerae anicteric, pearly TMs, nares patent, no discharge or erythema, pharynx normal Skin: scattered macules, SK on right supraspinatus region and left upper back, no specific worrisome lesions, scar left cheek from prior bio spy Oral cavity: MMM, no lesions Neck: supple, no lymphadenopathy, no thyromegaly, no masses, no bruits Heart: RRR, normal S1, S2, no murmurs Lungs: CTA bilaterally, no wheezes, rhonchi, or rales Abdomen: +bs, soft, non tender, non distended, no masses, no hepatomegaly, no splenomegaly Musculoskeletal: non tender, no swelling, no obvious deformity Extremities: no edema, no cyanosis, no clubbing Pulses: 2+ symmetric, upper and lower extremities, normal cap  refill Neurological: alert, oriented x 3, CN2-12 intact, strength normal upper extremities and lower extremities, sensation normal throughout, DTRs 2+ throughout, no cerebellar signs, gait normal Psychiatric: normal affect, behavior normal, pleasant  GU: declined DRE: refused    Assessment:   Encounter Diagnoses  Name Primary?  . Encounter for health maintenance examination in adult Yes  . Medicare annual wellness visit, subsequent   . Tobacco use   . Positive colorectal cancer screening using Cologuard test   . Vaccine counseling   . Leukocytosis, unspecified type   .  Mixed dyslipidemia   . Encounter for screening for malignant neoplasm of respiratory organs      Plan:   A preventative services visit was completed today.  During the course of the visit today, we discussed and counseled about appropriate screening and preventive services.  A health risk assessment was established today that included a review of current medications, allergies, social history, family history, medical and preventative health history, biometrics, and preventative screenings to identify potential safety concerns or impairments.  A personalized plan was printed today for your records and use.   Personalized health advice and education was given today to reduce health risks and promote self management and wellness.  Information regarding end of life planning was discussed today.  Conditions/risks identified: + cologuard test last year, due for GI consult and colonoscopy.  He did not go as scheduled for GI consult after the + cologuard test.   Advised we refer for this now  Chronic problems discussed today: Advised smoking cessation.  He is not ready to quit Advised yearly CT chest lung cancer screen  Labs today    Acute problems discussed today: none  Recommendations:  I recommend a yearly ophthalmology/optometry visit for glaucoma screening and eye checkup  I recommended a yearly dental visit  for hygiene and checkup  Advanced directives - discussed nature and purpose of Advanced Directives, encouraged them to complete them if they have not done so and/or encouraged them to get [place] a copy if they have done this already.  Referrals today: Advised GI consult.  He declined but will consider. Chest CT lung cancer screen  Immunizations: I recommended a yearly influenza vaccine, typically in September when the vaccine is usually available   [place] was seen today for [employer].  Diagnoses and all orders for this visit:  Encounter for health maintenance examination in adult -     Comprehensive metabolic panel -     CBC with Differential/Platelet -     Lipid panel -     Hemoglobin A1c -     Ambulatory referral to Gastroenterology  Medicare annual wellness visit, subsequent  Tobacco use  Positive colorectal cancer screening using Cologuard test -     Comprehensive metabolic panel -     CBC with Differential/Platelet -     Ambulatory referral to Gastroenterology  Vaccine counseling  Leukocytosis, unspecified type -     CBC with Differential/Platelet  Mixed dyslipidemia -     Lipid panel -     Hemoglobin A1c  Encounter for screening for malignant neoplasm of respiratory organs -     CT CHEST LUNG CA SCREEN LOW DOSE W/O CM; Future    Medicare Attestation A preventative services visit was completed today.  During the course of the visit the patient was educated and counseled about appropriate screening and preventive services.  A health risk assessment was established with the patient that included a review of current medications, allergies, social history, family history, medical and preventative health history, biometrics, and preventative screenings to identify potential safety concerns or impairments.  A personalized plan was printed today for the patient's records and use.   Personalized health advice and education was given today to reduce health risks and  promote self management and wellness.  Information regarding end of life planning was discussed today.  Dorothea Ogle, PA-C   02/23/2018

## 2018-02-23 ENCOUNTER — Telehealth: Payer: Self-pay

## 2018-02-23 ENCOUNTER — Other Ambulatory Visit: Payer: Self-pay | Admitting: Medical

## 2018-02-23 LAB — COMPREHENSIVE METABOLIC PANEL
A/G RATIO: 1.6 (ref 1.2–2.2)
ALBUMIN: 4.4 g/dL (ref 3.6–4.8)
ALK PHOS: 67 IU/L (ref 39–117)
ALT: 16 IU/L (ref 0–44)
AST: 15 IU/L (ref 0–40)
BUN / CREAT RATIO: 14 (ref 10–24)
BUN: 13 mg/dL (ref 8–27)
Bilirubin Total: 0.3 mg/dL (ref 0.0–1.2)
CO2: 19 mmol/L — ABNORMAL LOW (ref 20–29)
Calcium: 9.5 mg/dL (ref 8.6–10.2)
Chloride: 104 mmol/L (ref 96–106)
Creatinine, Ser: 0.93 mg/dL (ref 0.76–1.27)
GFR calc Af Amer: 98 mL/min/{1.73_m2} (ref >59)
GFR calc non Af Amer: 85 mL/min/{1.73_m2} (ref >59)
GLOBULIN, TOTAL: 2.7 g/dL (ref 1.5–4.5)
Glucose: 62 mg/dL — ABNORMAL LOW (ref 65–99)
Potassium: 4.7 mmol/L (ref 3.5–5.2)
SODIUM: 143 mmol/L (ref 134–144)
Total Protein: 7.1 g/dL (ref 6.0–8.5)

## 2018-02-23 LAB — LIPID PANEL
CHOL/HDL RATIO: 4.1 ratio (ref 0.0–5.0)
Cholesterol, Total: 122 mg/dL (ref 100–199)
HDL: 30 mg/dL — AB (ref >39)
LDL Calculated: 76 mg/dL (ref 0–99)
Triglycerides: 81 mg/dL (ref 0–149)
VLDL Cholesterol Cal: 16 mg/dL (ref 5–40)

## 2018-02-23 LAB — CBC WITH DIFFERENTIAL/PLATELET
Basophils Absolute: 0.1 10*3/uL (ref 0.0–0.2)
Basos: 0 %
EOS (ABSOLUTE): 0.1 10*3/uL (ref 0.0–0.4)
Eos: 1 %
HEMATOCRIT: 47.8 % (ref 37.5–51.0)
Hemoglobin: 16.7 g/dL (ref 13.0–17.7)
IMMATURE GRANULOCYTES: 0 %
Immature Grans (Abs): 0 10*3/uL (ref 0.0–0.1)
LYMPHS ABS: 4.1 10*3/uL — AB (ref 0.7–3.1)
Lymphs: 34 %
MCH: 31 pg (ref 26.6–33.0)
MCHC: 34.9 g/dL (ref 31.5–35.7)
MCV: 89 fL (ref 79–97)
MONOS ABS: 0.9 10*3/uL (ref 0.1–0.9)
Monocytes: 7 %
NEUTROS PCT: 58 %
Neutrophils Absolute: 7.1 10*3/uL — ABNORMAL HIGH (ref 1.4–7.0)
PLATELETS: 310 10*3/uL (ref 150–379)
RBC: 5.39 x10E6/uL (ref 4.14–5.80)
RDW: 13.5 % (ref 12.3–15.4)
WBC: 12.3 10*3/uL — AB (ref 3.4–10.8)

## 2018-02-23 LAB — HEMOGLOBIN A1C
ESTIMATED AVERAGE GLUCOSE: 123 mg/dL
Hgb A1c MFr Bld: 5.9 % — ABNORMAL HIGH (ref 4.8–5.6)

## 2018-02-23 MED ORDER — PRAVASTATIN SODIUM 20 MG PO TABS: 20.0000 mg | ORAL_TABLET | Freq: Every day | ORAL | 3 refills | Status: DC

## 2018-02-23 MED ORDER — ASPIRIN EC 81 MG PO TBEC: 81.0000 mg | DELAYED_RELEASE_TABLET | Freq: Every day | ORAL | 3 refills | Status: DC

## 2018-02-23 NOTE — Patient Instructions (Signed)
Thanks for trusting [place] with your health care and for coming in for a physical today.  Below are some general recommendations I have for you:  Yearly screenings See your eye doctor yearly for routine vision care. See your dentist yearly for routine dental care including hygiene visits twice yearly. See me here yearly for a routine physical and preventative care visit   Specific Concerns today:  . I recommend referral for gastroenterology as your cologuard test was + last year.  This could be from blood or cancer cells! . I recommend a repeat chest CT lung cancer screen yearly . Complete Living Will and [place] and get me a copy please   Please follow up yearly for a physical.   Preventative Care for Adults - Male      [place]:  A routine yearly physical is a good way to check in with your primary care provider about your health and preventive screening. It is also an opportunity to share updates about your health and any concerns you have, and receive a thorough all-over exam.   Most health insurance companies pay for at least some preventative services.  Check with your health plan for specific coverages.  WHAT PREVENTATIVE SERVICES DO WOMEN NEED?  Adult men should have their weight and blood pressure checked regularly.   Men age 72 and older should have their cholesterol levels checked regularly.  Beginning at age 46 and continuing to age 20, men should be screened for colorectal cancer.  Certain people may need continued testing until age 78.  Updating vaccinations is part of preventative care.  Vaccinations help protect against diseases such as the flu.  Osteoporosis is a disease in which the bones lose minerals and strength as we age. Men ages 9 and over should discuss this with their caregivers  Lab tests are generally done as part of preventative care to screen for anemia and blood disorders, to screen for problems with the  kidneys and liver, to screen for bladder problems, to check blood sugar, and to check your cholesterol level.  Preventative services generally include counseling about diet, exercise, avoiding tobacco, drugs, excessive alcohol consumption, and sexually transmitted infections.    GENERAL RECOMMENDATIONS FOR GOOD HEALTH:  Healthy diet:  Eat a variety of foods, including fruit, vegetables, animal or vegetable protein, such as meat, fish, chicken, and eggs, or beans, lentils, tofu, and grains, such as rice.  Drink plenty of water daily.  Decrease saturated fat in the diet, avoid lots of red meat, processed foods, sweets, fast foods, and fried foods.  Exercise:  Aerobic exercise helps maintain good heart health. At least 30-40 minutes of moderate-intensity exercise is recommended. For example, a brisk walk that increases your heart rate and breathing. This should be done on most days of the week.   Find a type of exercise or a variety of exercises that you enjoy so that it becomes a part of your daily life.  Examples are running, walking, swimming, water aerobics, and biking.  For motivation and support, explore group exercise such as aerobic class, spin class, Zumba, Yoga,or  martial arts, etc.    Set exercise goals for yourself, such as a certain weight goal, walk or run in a race such as a 5k walk/run.  Speak to your primary care provider about exercise goals.  Disease prevention:  If you smoke or chew tobacco, find out from your caregiver how to quit. It can literally save your life,  no matter how long you have been a tobacco user. If you do not use tobacco, never begin.   Maintain a healthy diet and normal weight. Increased weight leads to problems with blood pressure and diabetes.   The Body Mass Index or BMI is a way of measuring how much of your body is fat. Having a BMI above 27 increases the risk of heart disease, diabetes, hypertension, stroke and other problems related to obesity.  Your caregiver can help determine your BMI and based on it develop an exercise and dietary program to help you achieve or maintain this important measurement at a healthful level.  High blood pressure causes heart and blood vessel problems.  Persistent high blood pressure should be treated with medicine if weight loss and exercise do not work.   Fat and cholesterol leaves deposits in your arteries that can block them. This causes heart disease and vessel disease elsewhere in your body.  If your cholesterol is found to be high, or if you have heart disease or certain other medical conditions, then you may need to have your cholesterol monitored frequently and be treated with medication.   Ask if you should have a cardiac stress test if your history suggests this. A stress test is a test done on a treadmill that looks for heart disease. This test can find disease prior to there being a problem.  Osteoporosis is a disease in which the bones lose minerals and strength as we age. This can result in serious bone fractures. Risk of osteoporosis can be identified using a bone density scan. Men ages 59 and over should discuss this with their caregivers. Ask your caregiver whether you should be taking a calcium supplement and Vitamin D, to reduce the rate of osteoporosis.   Avoid drinking alcohol in excess (more than two drinks per day).  Avoid use of street drugs. Do not share needles with anyone. Ask for professional help if you need assistance or instructions on stopping the use of alcohol, cigarettes, and/or drugs.  Brush your teeth twice a day with fluoride toothpaste, and floss once a day. Good oral hygiene prevents tooth decay and gum disease. The problems can be painful, unattractive, and can cause other health problems. Visit your dentist for a routine oral and dental check up and preventive care every 6-12 months.   Look at your skin regularly.  Use a mirror to look at your back. Notify your caregivers  of changes in moles, especially if there are changes in shapes, colors, a size larger than a pencil eraser, an irregular border, or development of new moles.  Safety:  Use seatbelts 100% of the time, whether driving or as a passenger.  Use safety devices such as hearing protection if you work in environments with loud noise or significant background noise.  Use safety glasses when doing any work that could send debris in to the eyes.  Use a helmet if you ride a bike or motorcycle.  Use appropriate safety gear for contact sports.  Talk to your caregiver about gun safety.  Use sunscreen with a SPF (or skin protection factor) of 15 or greater.  Lighter skinned people are at a greater risk of skin cancer. Don't forget to also wear sunglasses in order to protect your eyes from too much damaging sunlight. Damaging sunlight can accelerate cataract formation.   Practice safe sex. Use condoms. Condoms are used for birth control and to help reduce the spread of sexually transmitted infections (or STIs).  Some of the STIs are gonorrhea (the clap), chlamydia, syphilis, trichomonas, herpes, HPV (human papilloma virus) and HIV (human immunodeficiency virus) which causes AIDS. The herpes, HIV and HPV are viral illnesses that have no cure. These can result in disability, cancer and death.   Keep carbon monoxide and smoke detectors in your home functioning at all times. Change the batteries every 6 months or use a model that plugs into the wall.   Vaccinations:  Stay up to date with your tetanus shots and other required immunizations. You should have a booster for tetanus every 10 years. Be sure to get your flu shot every year, since 5%-20% of the [place] population comes down with the flu. The flu vaccine changes each year, so being vaccinated once is not enough. Get your shot in the fall, before the flu season peaks.   Other vaccines to consider:  Human Papilloma Virus or HPV causes cancer of the cervix, and other  infections that can be transmitted from person to person. There is a vaccine for HPV, and males should get immunized between the ages of 61 and 46. It requires a series of 3 shots.   Pneumococcal vaccine to protect against certain types of pneumonia.  This is normally recommended for adults age 75 or older.  However, adults younger than 68 years old with certain underlying conditions such as diabetes, heart or lung disease should also receive the vaccine.  Shingles vaccine to protect against Varicella Zoster if you are older than age 60, or younger than 68 years old with certain underlying illness.  If you have not had the Shingrix vaccine, please call your insurer to inquire about coverage for the Shingrix vaccine given in 2 doses.   Some insurers cover this vaccine after age 67, some cover this after age 14.  If your insurer covers this, then call to schedule appointment to have this vaccine here  Hepatitis A vaccine to protect against a form of infection of the liver by a virus acquired from food.  Hepatitis B vaccine to protect against a form of infection of the liver by a virus acquired from blood or body fluids, particularly if you work in health care.  If you plan to travel internationally, check with your local health department for specific vaccination recommendations.   What should I know about cancer screening? Many types of cancers can be detected early and may often be prevented. Lung Cancer  You should be screened every year for lung cancer if: ? You are a current smoker who has smoked for at least 30 years. ? You are a former smoker who has quit within the past 15 years.  Talk to your health care provider about your screening options, when you should start screening, and how often you should be screened.  Colorectal Cancer  Routine colorectal cancer screening usually begins at 68 years of age and should be repeated every 5-10 years until you are 68 years old. You may need to  be screened more often if early forms of precancerous polyps or small growths are found. Your health care provider may recommend screening at an earlier age if you have risk factors for colon cancer.  Your health care provider may recommend using home test kits to check for hidden blood in the stool.  A small camera at the end of a tube can be used to examine your colon (sigmoidoscopy or colonoscopy). This checks for the earliest forms of colorectal cancer.  Prostate and Testicular Cancer  Depending on your age and overall health, your health care provider may do certain tests to screen for prostate and testicular cancer.  Talk to your health care provider about any symptoms or concerns you have about testicular or prostate cancer.  Skin Cancer  Check your skin from head to toe regularly.  Tell your health care provider about any new moles or changes in moles, especially if: ? There is a change in a mole's size, shape, or color. ? You have a mole that is larger than a pencil eraser.  Always use sunscreen. Apply sunscreen liberally and repeat throughout the day.  Protect yourself by wearing long sleeves, pants, a wide-brimmed hat, and sunglasses when outside.

## 2018-02-23 NOTE — Telephone Encounter (Signed)
Brent Espinoza requested that a copy of the awv be sent out to pt. Done [facility]

## 2018-02-23 NOTE — Addendum Note (Signed)
Addended by: Carlena Hurl on: 02/23/2018 07:48 AM   Modules accepted: Level of Service

## 2018-02-23 NOTE — Addendum Note (Signed)
Addended by: Carlena Hurl on: 02/23/2018 08:34 PM   Modules accepted: Level of Service

## 2018-03-01 ENCOUNTER — Telehealth: Payer: Self-pay

## 2018-03-01 NOTE — Telephone Encounter (Signed)
Pt has been informed he has been scheduled for a ct on 03/10/18. Pt was asked to arrive at 9:40am

## 2018-03-03 ENCOUNTER — Encounter: Payer: Self-pay | Admitting: Internal Medicine

## 2018-03-10 ENCOUNTER — Ambulatory Visit
Admission: RE | Admit: 2018-03-10 | Discharge: 2018-03-10 | Disposition: A | Discharge status: Home / Self Care | Payer: Medicare HMO | Source: Ambulatory Visit | Attending: Medical | Admitting: Medical

## 2018-03-10 DIAGNOSIS — F17210 Nicotine dependence, cigarettes, uncomplicated: Secondary | ICD-10-CM | POA: Diagnosis not present

## 2018-03-10 DIAGNOSIS — Z122 Encounter for screening for malignant neoplasm of respiratory organs: Secondary | ICD-10-CM

## 2018-03-15 ENCOUNTER — Encounter: Payer: Self-pay | Admitting: Gastroenterology

## 2018-03-18 ENCOUNTER — Ambulatory Visit (AMBULATORY_SURGERY_CENTER): Payer: Self-pay | Admitting: *Deleted

## 2018-03-18 ENCOUNTER — Other Ambulatory Visit: Payer: Self-pay

## 2018-03-18 ENCOUNTER — Telehealth: Payer: Self-pay | Admitting: Medical

## 2018-03-18 VITALS — Ht 67.0 in | Wt 173.4 lb

## 2018-03-18 DIAGNOSIS — R195 Other fecal abnormalities: Secondary | ICD-10-CM

## 2018-03-18 MED ORDER — NA SULFATE-K SULFATE-MG SULF 17.5-3.13-1.6 GM/177ML PO SOLN: 1.0000 | Freq: Once | ORAL | 0 refills | Status: AC

## 2018-03-18 NOTE — Telephone Encounter (Signed)
Glad to hear. 

## 2018-03-18 NOTE — Telephone Encounter (Signed)
Pt came by on his was home after his GI consult for a colonoscopy. Pt wanted you to be aware that he is scheduled to have this on 06/21. He stated you would be pleased to hear.

## 2018-03-18 NOTE — Progress Notes (Signed)
No egg or soy allergy known to patient  No issues with past sedation with any surgeries  or procedures, no intubation problems  No diet pills per patient No home 02 use per patient  No blood thinners per patient  Pt denies issues with constipation  No A fib or A flutter  EMMI video sent to pt's e mail pt declined   

## 2018-04-01 ENCOUNTER — Encounter: Payer: Self-pay | Admitting: Gastroenterology

## 2018-04-01 ENCOUNTER — Ambulatory Visit (AMBULATORY_SURGERY_CENTER): Payer: Medicare HMO | Admitting: Gastroenterology

## 2018-04-01 ENCOUNTER — Other Ambulatory Visit: Payer: Self-pay

## 2018-04-01 VITALS — BP 121/68 | HR 63 | Temp 97.1°F | Resp 19 | Ht 67.0 in | Wt 173.0 lb

## 2018-04-01 DIAGNOSIS — D120 Benign neoplasm of cecum: Secondary | ICD-10-CM

## 2018-04-01 DIAGNOSIS — K635 Polyp of colon: Secondary | ICD-10-CM | POA: Diagnosis not present

## 2018-04-01 DIAGNOSIS — D122 Benign neoplasm of ascending colon: Secondary | ICD-10-CM

## 2018-04-01 DIAGNOSIS — D127 Benign neoplasm of rectosigmoid junction: Secondary | ICD-10-CM | POA: Diagnosis not present

## 2018-04-01 DIAGNOSIS — D123 Benign neoplasm of transverse colon: Secondary | ICD-10-CM

## 2018-04-01 DIAGNOSIS — D125 Benign neoplasm of sigmoid colon: Secondary | ICD-10-CM

## 2018-04-01 DIAGNOSIS — R195 Other fecal abnormalities: Secondary | ICD-10-CM | POA: Diagnosis present

## 2018-04-01 DIAGNOSIS — Z1211 Encounter for screening for malignant neoplasm of colon: Secondary | ICD-10-CM | POA: Diagnosis not present

## 2018-04-01 MED ORDER — SODIUM CHLORIDE 0.9 % IV SOLN: 500.0000 mL | Freq: Once | INTRAVENOUS | Status: DC

## 2018-04-01 NOTE — Op Note (Signed)
[place] Patient Name: Brent Espinoza Procedure Date: 04/01/2018 12:05 PM MRN: [id number] Endoscopist: Remo Lipps P. Havery Moros , MD Age: 67 Referring MD:  Date of Birth: Apr 30, 1951 Gender: Male Account #: [id number] Procedure:                Colonoscopy Indications:              This is the patient's first colonoscopy, Positive                            Cologuard test Medicines:                Monitored Anesthesia Care Procedure:                Pre-Anesthesia Assessment:                           - Prior to the procedure, a History and Physical                            was performed, and patient medications and                            allergies were reviewed. The patient's tolerance of                            previous anesthesia was also reviewed. The risks                            and benefits of the procedure and the sedation                            options and risks were discussed with the patient.                            All questions were answered, and informed consent                            was obtained. Prior Anticoagulants: The patient has                            taken no previous anticoagulant or antiplatelet                            agents. ASA Grade Assessment: II - A patient with                            mild systemic disease. After reviewing the risks                            and benefits, the patient was deemed in                            satisfactory condition to undergo the procedure.  After obtaining informed consent, the colonoscope                            was passed under direct vision. Throughout the                            procedure, the patient's blood pressure, pulse, and                            oxygen saturations were monitored continuously. The                            Colonoscope was introduced through the anus and                            advanced to the the terminal ileum, with                             identification of the appendiceal orifice and IC                            valve. The colonoscopy was performed without                            difficulty. The patient tolerated the procedure                            well. The quality of the bowel preparation was                            adequate. The terminal ileum, ileocecal valve,                            appendiceal orifice, and rectum were photographed. Scope In: 12:11:55 PM Scope Out: 12:41:55 PM Scope Withdrawal Time: 0 hours 27 minutes 5 seconds  Total Procedure Duration: 0 hours 30 minutes 0 seconds  Findings:                 The perianal and digital rectal examinations were                            normal.                           The terminal ileum appeared normal.                           A 12 mm polyp was found in the cecum. The polyp was                            flat. The polyp was removed with a cold snare.                            Resection and retrieval were complete.  Two flat polyps were found in the ascending colon.                            The polyps were 4 to 8 mm in size. These polyps                            were removed with a cold snare. Resection and                            retrieval were complete.                           A 4 mm polyp was found in the transverse colon. The                            polyp was sessile. The polyp was removed with a                            cold snare. Resection and retrieval were complete.                           Two sessile polyps were found in the sigmoid colon.                            The polyps were 4 mm in size. These polyps were                            removed with a cold snare. Resection and retrieval                            were complete.                           Two sessile polyps were found in the recto-sigmoid                            colon. The polyps were 2 to 7 mm in size.  These                            polyps were removed with a cold snare. Resection                            and retrieval were complete.                           Multiple medium-mouthed diverticula were found in                            the ascending colon and left colon.                           Internal hemorrhoids were found during retroflexion.  The exam was otherwise without abnormality. Complications:            No immediate complications. Estimated blood loss:                            Minimal. Estimated Blood Loss:     Estimated blood loss was minimal. Impression:               - The examined portion of the ileum was normal.                           - One 12 mm polyp in the cecum, removed with a cold                            snare. Resected and retrieved.                           - Two 4 to 8 mm polyps in the ascending colon,                            removed with a cold snare. Resected and retrieved.                           - One 4 mm polyp in the transverse colon, removed                            with a cold snare. Resected and retrieved.                           - Two 4 mm polyps in the sigmoid colon, removed                            with a cold snare. Resected and retrieved.                           - Two 2 to 7 mm polyps at the recto-sigmoid colon,                            removed with a cold snare. Resected and retrieved.                           - Diverticulosis in the ascending colon and in the                            left colon.                           - Internal hemorrhoids.                           - The examination was otherwise normal. Recommendation:           - Patient has a contact number available for  emergencies. The signs and symptoms of potential                            delayed complications were discussed with the                            patient. Return to normal activities  tomorrow.                            Written discharge instructions were provided to the                            patient.                           - Resume previous diet.                           - Continue present medications.                           - Await pathology results.                           - Repeat colonoscopy for surveillance based on                            pathology results. Remo Lipps P. Armbruster, MD 04/01/2018 12:48:51 PM This report has been signed electronically.

## 2018-04-01 NOTE — Patient Instructions (Signed)
YOU HAD AN ENDOSCOPIC PROCEDURE TODAY AT [facility]:   Refer to the procedure report that was given to you for any specific questions about what was found during the examination.  If the procedure report does not answer your questions, please call your gastroenterologist to clarify.  If you requested that your care partner not be given the details of your procedure findings, then the procedure report has been included in a sealed envelope for you to review at your convenience later.  YOU SHOULD EXPECT: Some feelings of bloating in the abdomen. Passage of more gas than usual.  Walking can help get rid of the air that was put into your GI tract during the procedure and reduce the bloating. If you had a lower endoscopy (such as a colonoscopy or flexible sigmoidoscopy) you may notice spotting of blood in your stool or on the toilet paper. If you underwent a bowel prep for your procedure, you may not have a normal bowel movement for a few days.  Please Note:  You might notice some irritation and congestion in your nose or some drainage.  This is from the oxygen used during your procedure.  There is no need for concern and it should clear up in a day or so.  SYMPTOMS TO REPORT IMMEDIATELY:   Following lower endoscopy (colonoscopy or flexible sigmoidoscopy):  Excessive amounts of blood in the stool  Significant tenderness or worsening of abdominal pains  Swelling of the abdomen that is new, acute  Fever of 100F or higher  Please see handouts given to you on polyps, Hemorrhoids and Diverticulosis.  For urgent or emergent issues, a gastroenterologist can be reached at any hour by calling [phone].   DIET:  We do recommend a small meal at first, but then you may proceed to your regular diet.  Drink plenty of fluids but you should avoid alcoholic beverages for 24 hours.  ACTIVITY:  You should plan to take it easy for the rest of today and you should NOT DRIVE or use heavy  machinery until tomorrow (because of the sedation medicines used during the test).    FOLLOW UP: Our staff will call the number listed on your records the next business day following your procedure to check on you and address any questions or concerns that you may have regarding the information given to you following your procedure. If we do not reach you, we will leave a message.  However, if you are feeling well and you are not experiencing any problems, there is no need to return our call.  We will assume that you have returned to your regular daily activities without incident.  If any biopsies were taken you will be contacted by phone or by letter within the next 1-3 weeks.  Please call us at [phone] if you have not heard about the biopsies in 3 weeks.    SIGNATURES/CONFIDENTIALITY: You and/or your care partner have signed paperwork which will be entered into your electronic medical record.  These signatures attest to the fact that that the information above on your After Visit Summary has been reviewed and is understood.  Full responsibility of the confidentiality of this discharge information lies with you and/or your care-partner.  Thank you for letting us take care of your healthcare needs today.

## 2018-04-01 NOTE — Progress Notes (Signed)
Called to room to assist during endoscopic procedure.  Patient ID and intended procedure confirmed with present staff. Received instructions for my participation in the procedure from the performing physician.  

## 2018-04-01 NOTE — Progress Notes (Signed)
Pt's states no medical or surgical changes since previsit or office visit. 

## 2018-04-01 NOTE — Progress Notes (Signed)
To PACU, VSS. Report to [email or web address] 

## 2018-04-04 ENCOUNTER — Telehealth: Payer: Self-pay | Admitting: *Deleted

## 2018-04-04 DIAGNOSIS — H34811 Central retinal vein occlusion, right eye, with macular edema: Secondary | ICD-10-CM | POA: Diagnosis not present

## 2018-04-04 DIAGNOSIS — H2512 Age-related nuclear cataract, left eye: Secondary | ICD-10-CM | POA: Diagnosis not present

## 2018-04-04 DIAGNOSIS — H35351 Cystoid macular degeneration, right eye: Secondary | ICD-10-CM | POA: Diagnosis not present

## 2018-04-04 DIAGNOSIS — H353211 Exudative age-related macular degeneration, right eye, with active choroidal neovascularization: Secondary | ICD-10-CM | POA: Diagnosis not present

## 2018-04-04 DIAGNOSIS — H353114 Nonexudative age-related macular degeneration, right eye, advanced atrophic with subfoveal involvement: Secondary | ICD-10-CM | POA: Diagnosis not present

## 2018-04-04 NOTE — Telephone Encounter (Signed)
  Follow up Call-  Call back number 04/01/2018  Post procedure Call Back phone  # [phone]  Permission to leave phone message Yes  Some recent data might be hidden     Patient questions:  Do you have a fever, pain , or abdominal swelling? No. Pain Score  0 *  Have you tolerated food without any problems? Yes.    Have you been able to return to your normal activities? Yes.    Do you have any questions about your discharge instructions: Diet   No. Medications  No. Follow up visit  No.  Do you have questions or concerns about your Care? No.  Actions: * If pain score is 4 or above: No action needed, pain <4.

## 2018-04-07 ENCOUNTER — Encounter: Payer: Self-pay | Admitting: Gastroenterology

## 2018-04-15 ENCOUNTER — Telehealth: Payer: Self-pay | Admitting: Gastroenterology

## 2018-04-15 NOTE — Telephone Encounter (Signed)
Spoke to patient to let him know results and that he should receive these in the mail.

## 2018-05-30 DIAGNOSIS — H3561 Retinal hemorrhage, right eye: Secondary | ICD-10-CM | POA: Diagnosis not present

## 2018-05-30 DIAGNOSIS — H43811 Vitreous degeneration, right eye: Secondary | ICD-10-CM | POA: Diagnosis not present

## 2018-05-30 DIAGNOSIS — H34811 Central retinal vein occlusion, right eye, with macular edema: Secondary | ICD-10-CM | POA: Diagnosis not present

## 2018-05-30 DIAGNOSIS — H35351 Cystoid macular degeneration, right eye: Secondary | ICD-10-CM | POA: Diagnosis not present

## 2018-06-08 ENCOUNTER — Telehealth: Payer: Self-pay | Admitting: Medical

## 2018-06-08 ENCOUNTER — Encounter: Payer: Self-pay | Admitting: Medical

## 2018-06-08 ENCOUNTER — Ambulatory Visit (INDEPENDENT_AMBULATORY_CARE_PROVIDER_SITE_OTHER): Payer: Medicare HMO | Admitting: Medical

## 2018-06-08 VITALS — BP 120/80 | HR 62 | Temp 98.1°F | Resp 16 | Ht 67.5 in | Wt 163.8 lb

## 2018-06-08 DIAGNOSIS — R9389 Abnormal findings on diagnostic imaging of other specified body structures: Secondary | ICD-10-CM

## 2018-06-08 DIAGNOSIS — I700 Atherosclerosis of aorta: Secondary | ICD-10-CM

## 2018-06-08 DIAGNOSIS — I2510 Atherosclerotic heart disease of native coronary artery without angina pectoris: Secondary | ICD-10-CM

## 2018-06-08 DIAGNOSIS — E782 Mixed hyperlipidemia: Secondary | ICD-10-CM

## 2018-06-08 DIAGNOSIS — N529 Male erectile dysfunction, unspecified: Secondary | ICD-10-CM | POA: Insufficient documentation

## 2018-06-08 DIAGNOSIS — Z720 Tobacco use: Secondary | ICD-10-CM

## 2018-06-08 HISTORY — DX: Abnormal findings on diagnostic imaging of other specified body structures: R93.89

## 2018-06-08 HISTORY — DX: Atherosclerotic heart disease of native coronary artery without angina pectoris: I25.10

## 2018-06-08 HISTORY — DX: Male erectile dysfunction, unspecified: N52.9

## 2018-06-08 HISTORY — DX: Atherosclerosis of aorta: I70.0

## 2018-06-08 MED ORDER — SILDENAFIL CITRATE 20 MG PO TABS: ORAL_TABLET | ORAL | 0 refills | Status: DC

## 2018-06-08 NOTE — Progress Notes (Signed)
Subjective: Chief Complaint  Patient presents with  . personal   Here for personal issue.   Not getting full erection the past few weeks.  Has never had problems with sexual function until last few weeks, very embarrassing.  Has a girlfriend on and off relationship.   No new changes in emotional state, no new relationship issues, no recent changes in medication, activity or other.   Been in usual state of health.    He notes prior cardiac study in past 5-10 years, but doesn't recall name of doctor/practice.    Does get some fatigue issues.  Denies CP, SOB, DOE, edema.  No blood in urine, no urinary changes, no hx/o prostate issues.  He is a long term smoker, still smokes but is compliant with statin and aspirin.   No other aggravating or relieving factors. No other complaint.   Past Medical History:  Diagnosis Date  . Allergy    mild  . Cataract    removed right eye  . GERD (gastroesophageal reflux disease)    occ  . Hyperlipidemia   . Smoking    Current Outpatient Medications on File Prior to Visit  Medication Sig Dispense Refill  . aspirin EC 81 MG tablet Take 1 tablet (81 mg total) by mouth daily. 90 tablet 3  . naproxen sodium (ALEVE) 220 MG tablet Take 220 mg by mouth.    . pravastatin (PRAVACHOL) 20 MG tablet Take 1 tablet (20 mg total) by mouth at bedtime. 90 tablet 3   Current Facility-Administered Medications on File Prior to Visit  Medication Dose Route Frequency Provider Last Rate Last Dose  . 0.9 %  sodium chloride infusion  500 mL Intravenous Once Armbruster, Carlota Raspberry, MD       ROS as in subjective    Objective: BP 120/80   Pulse 62   Temp 98.1 F (36.7 C) (Oral)   Resp 16   Ht 5' 7.5" (1.715 m)   Wt 163 lb 12.8 oz (74.3 kg)   SpO2 96%   BMI 25.28 kg/m   Gen: wd, wn, nad Lungs clear Heart rrr, normal s1, s2, no murmurs Pulses 1-2+ UE and LE Neck: supple, nonteder, no bruits Ext: no edema GU: normal male circumcised, no nodules, no  hernia     Assessment: Encounter Diagnoses  Name Primary?  . Erectile dysfunction, unspecified erectile dysfunction type Yes  . Mixed dyslipidemia   . Tobacco use   . Abnormal CT of the chest   . Coronary artery disease involving native heart without angina pectoris, unspecified vessel or lesion type   . Atherosclerosis of aorta ([place])      Plan: We discussed his concerns for erectile dysfunction.  Discussed possible causes.  My concern is a cardiogenic source given coronary artery disease, hyperlipidemia, long-term smoking history, age 67 years old.    We reviewed the chest CT lung cancer screening from May/30/19.  For whatever reason this result did not come to my inbox back in May as there are no notations about contacting the patient for results.  Thus we reviewed the abnormalities including aortic atherosclerosis, left main and three-vessel coronary artery disease, mild diffuse bronchial wall thickening suggestive of paraseptal emphysema.  I recommended consult with cardiology for work-up including stress test.  He seemed to either be in denial or not willing to accept the serious nature of the CT findings.  We will check testosterone level, but I advised that ED can be a red flag signal for CAD and  heart disease and this should be evaluated at this time.  Thus, referral to cardiology.   C/t statin.  He is not willing to stop smoking.  He was adamant about medication to help ED.   I advised of risks of medication including priapism, possible fatal drop in BP, syncope, and other risks.   He will begin trial of low dose 1 tablet/20mg  Sildenafil generic prn 30-45 min prior to sexual activity, max 1 daily.  Discussed possible medication interactions  In general he is not usually open to lots of medications or evaluation, and not usually ready to use a recommendation.  Nevertheless, he is agreeable to cardiology consult.   I would advised PFT in the future as well, but he declines PFT at  this and claims he has no breathing issues.   F/u with cardiology soon.  Khairi was seen today for personal.  Diagnoses and all orders for this visit:  Erectile dysfunction, unspecified erectile dysfunction type -     Testosterone -     Ambulatory referral to Cardiology  Mixed dyslipidemia -     High sensitivity CRP -     Ambulatory referral to Cardiology  Tobacco use -     Ambulatory referral to Cardiology  Abnormal CT of the chest  Coronary artery disease involving native heart without angina pectoris, unspecified vessel or lesion type -     High sensitivity CRP -     Ambulatory referral to Cardiology  Atherosclerosis of aorta ([place]) -     High sensitivity CRP -     Ambulatory referral to Cardiology  Other orders -     sildenafil (REVATIO) 20 MG tablet; 1 tablet (20mg ) prior to sexual activity daily prn

## 2018-06-08 NOTE — Patient Instructions (Signed)
Recommendations:  You have evidence on CT chest of cholesterol buildup and some blockages in your heart arteries/coronary arteries  Because of this I want you to see a heart doctor soon.     This could be related to your recent change in erectile dysfunction  We will make this referral  We are checking your testosterone level to make sure this is not causing your change in sexual function  Begin Sildenafil 20mg , 1 tablet daily 30-45 minutes before sexual activity  Don't use more than 1 tablet in a given 24 hours for now.   Once you see cardiology, they can decide whether you need a higher dose     Erectile Dysfunction Erectile dysfunction (ED) is the inability to get or keep an erection in order to have sexual intercourse. Erectile dysfunction may include:  Inability to get an erection.  Lack of enough hardness of the erection to allow penetration.  Loss of the erection before sex is finished.  What are the causes? This condition may be caused by:  Certain medicines, such as: ? Pain relievers. ? Antihistamines. ? Antidepressants. ? Blood pressure medicines. ? Water pills (diuretics). ? Ulcer medicines. ? Muscle relaxants. ? Drugs.  Excessive drinking.  Psychological causes, such as: ? Anxiety. ? Depression. ? Sadness. ? Exhaustion. ? Performance fear. ? Stress.  Physical causes, such as: ? Artery problems. This may include diabetes, smoking, liver disease, or atherosclerosis. ? High blood pressure. ? Hormonal problems, such as low testosterone. ? Obesity. ? Nerve problems. This may include back or pelvic injuries, diabetes mellitus, multiple sclerosis, or Parkinson disease.  What are the signs or symptoms? Symptoms of this condition include:  Inability to get an erection.  Lack of enough hardness of the erection to allow penetration.  Loss of the erection before sex is finished.  Normal erections at some times, but with frequent unsatisfactory  episodes.  Low sexual satisfaction in either partner due to erection problems.  A curved penis occurring with erection. The curve may cause pain or the penis may be too curved to allow for intercourse.  Never having nighttime erections.  How is this diagnosed? This condition is often diagnosed by:  Performing a physical exam to find other diseases or specific problems with the penis.  Asking you detailed questions about the problem.  Performing blood tests to check for diabetes mellitus or to measure hormone levels.  Performing other tests to check for underlying health conditions.  Performing an ultrasound exam to check for scarring.  Performing a test to check blood flow to the penis.  Doing a sleep study at home to measure nighttime erections.  How is this treated? This condition may be treated by:  Medicine taken by mouth to help you achieve an erection (oral medicine).  Hormone replacement therapy to replace low testosterone levels.  Medicine that is injected into the penis. Your health care provider may instruct you how to give yourself these injections at home.  Vacuum pump. This is a pump with a ring on it. The pump and ring are placed on the penis and used to create pressure that helps the penis become erect.  Penile implant surgery. In this procedure, you may receive: ? An inflatable implant. This consists of cylinders, a pump, and a reservoir. The cylinders can be inflated with a fluid that helps to create an erection, and they can be deflated after intercourse. ? A semi-rigid implant. This consists of two silicone rubber rods. The rods provide some rigidity.  They are also flexible, so the penis can both curve downward in its normal position and become straight for sexual intercourse.  Blood vessel surgery, to improve blood flow to the penis. During this procedure, a blood vessel from a different part of the body is placed into the penis to allow blood to flow  around (bypass) damaged or blocked blood vessels.  Lifestyle changes, such as exercising more, losing weight, and quitting smoking.  Follow these instructions at home: Medicines  Take over-the-counter and prescription medicines only as told by your health care provider. Do not increase the dosage without first discussing it with your health care provider.  If you are using self-injections, perform injections as directed by your health care provider. Make sure to avoid any veins that are on the surface of the penis. After giving an injection, apply pressure to the injection site for 5 minutes. General instructions  Exercise regularly, as directed by your health care provider. Work with your health care provider to lose weight, if needed.  Do not use any products that contain nicotine or tobacco, such as cigarettes and e-cigarettes. If you need help quitting, ask your health care provider.  Before using a vacuum pump, read the instructions that come with the pump and discuss any questions with your health care provider.  Keep all follow-up visits as told by your health care provider. This is important. Contact a health care provider if:  You feel nauseous.  You vomit. Get help right away if:  You are taking oral or injectable medicines and you have an erection that lasts longer than 4 hours. If your health care provider is unavailable, go to the nearest emergency room for evaluation. An erection that lasts much longer than 4 hours can result in permanent damage to your penis.  You have severe pain in your groin or abdomen.  You develop redness or severe swelling of your penis.  You have redness spreading up into your groin or lower abdomen.  You are unable to urinate.  You experience chest pain or a rapid heart beat (palpitations) after taking oral medicines. Summary  Erectile dysfunction (ED) is the inability to get or keep an erection during sexual intercourse. This problem can  usually be treated successfully.  This condition is diagnosed based on a physical exam, your symptoms, and tests to determine the cause. Treatment varies depending on the cause, and may include medicines, hormone therapy, surgery, or vacuum pump.  You may need follow-up visits to make sure that you are using your medicines or devices correctly.  Get help right away if you are taking or injecting medicines and you have an erection that lasts longer than 4 hours. This information is not intended to replace advice given to you by your health care provider. Make sure you discuss any questions you have with your health care provider. Document Released: 09/25/2000 Document Revised: 10/14/2016 Document Reviewed: 10/14/2016 Elsevier Interactive Patient Education  2017 [employer].

## 2018-06-08 NOTE — Telephone Encounter (Signed)
Please call the imaging center that did the chest CT 03/10/18.  I never received results in my inbox then.   I noticed today when patient was here that he had results in the chart that I hadn't seen or commented on.   They need to be aware that I wasn't notified in the inbox with this result back in May.  I always address imaging findings.

## 2018-06-09 LAB — TESTOSTERONE: Testosterone: 456 ng/dL (ref 264–916)

## 2018-06-09 LAB — HIGH SENSITIVITY CRP: CRP, High Sensitivity: 6.28 mg/L — ABNORMAL HIGH (ref 0.00–3.00)

## 2018-06-15 NOTE — Telephone Encounter (Signed)
Imaging was informed. She stated she is not sure how to make you aware in her inbox.

## 2018-06-19 ENCOUNTER — Telehealth: Payer: Self-pay | Admitting: Medical

## 2018-06-19 NOTE — Telephone Encounter (Signed)
Recv D fax that Sildenafil not covered,  No need to do P.A. Med [facility] will not cover any ED meds.  Will offer cash pay options

## 2018-07-23 NOTE — Telephone Encounter (Signed)
No covered ED meds for Med D

## 2018-07-25 DIAGNOSIS — H353114 Nonexudative age-related macular degeneration, right eye, advanced atrophic with subfoveal involvement: Secondary | ICD-10-CM | POA: Diagnosis not present

## 2018-07-25 DIAGNOSIS — H2512 Age-related nuclear cataract, left eye: Secondary | ICD-10-CM | POA: Diagnosis not present

## 2018-07-25 DIAGNOSIS — H35351 Cystoid macular degeneration, right eye: Secondary | ICD-10-CM | POA: Diagnosis not present

## 2018-07-25 DIAGNOSIS — H34811 Central retinal vein occlusion, right eye, with macular edema: Secondary | ICD-10-CM | POA: Diagnosis not present

## 2018-07-28 ENCOUNTER — Encounter (INDEPENDENT_AMBULATORY_CARE_PROVIDER_SITE_OTHER): Payer: Self-pay

## 2018-07-28 ENCOUNTER — Encounter: Payer: Self-pay | Admitting: Cardiovascular Disease

## 2018-07-28 ENCOUNTER — Ambulatory Visit: Payer: Medicare HMO | Admitting: Cardiovascular Disease

## 2018-07-28 VITALS — BP 144/86 | HR 75 | Ht 67.0 in | Wt 169.0 lb

## 2018-07-28 DIAGNOSIS — I739 Peripheral vascular disease, unspecified: Secondary | ICD-10-CM

## 2018-07-28 DIAGNOSIS — I25118 Atherosclerotic heart disease of native coronary artery with other forms of angina pectoris: Secondary | ICD-10-CM

## 2018-07-28 NOTE — Patient Instructions (Addendum)
Medication Instructions:  Your physician recommends that you continue on your current medications as directed. Please refer to the Current Medication list given to you today.  Labwork: NONE  Testing/Procedures: Your physician has requested that you have en exercise stress myoview. For further information please visit [email or web address]. Please follow instruction sheet, as given.  Your physician has requested that you have a lower extremity arterial  Duplex with ABI's.  Allow one hour for this exam. There are no restrictions or special instructions.  Follow-Up: Your physician wants you to follow-up in: 12 months with Dr. Johnsie Cancel. You will receive a reminder letter in the mail two months in advance. If you don't receive a letter, please call our office to schedule the follow-up appointment.   If you need a refill on your cardiac medications before your next appointment, please call your pharmacy.

## 2018-07-28 NOTE — Progress Notes (Signed)
Cardiology Office Note   Date:  07/28/2018   ID:  Taivon, Haroon Oct 09, 1951, MRN [id number]  PCP:  Carlena Hurl, PA-C  Cardiologist:   Jenkins Rouge, MD   No chief complaint on file.     History of Present Illness: Brent Espinoza is a 67 y.o. male who presents for consultation regarding erectile dysfunction associated with smoking , HLD CT ling cancer screening with LM and 3 vessel CAD atherosclerosis.calcium noted Despite this was prescribed 20 mg of Sidenafil PRN prior to sex. AAA screening US done 04/06/17 showed no AAA but suggested >50% bilateral iliac disease  Denies SSCP, mild exertional dyspnea no palpitations or syncope   Discussed ways to stop smoking including Chantix, Wellbutrin and patches he did not like gum in past He denies claudication chest pain and does not think he has emphysema despite 50 years of smoking Retired from [employer] has girlfriend but no longer married     Past Medical History:  Diagnosis Date  . Abnormal CT of the chest 06/08/2018  . Allergy    mild  . Atherosclerosis of aorta ([place]) 06/08/2018  . Cataract    removed right eye  . Coronary artery disease involving native heart without angina pectoris 06/08/2018  . Erectile dysfunction 06/08/2018  . Eye disease 02/18/2017  . GERD (gastroesophageal reflux disease)    occ  . HDL deficiency 02/19/2017  . Hyperlipidemia   . Impacted cerumen of right ear 02/18/2017  . Leukocytosis 02/19/2017  . Mixed dyslipidemia 02/19/2017  . Overweight 02/18/2017  . Smoking   . Tobacco use 02/18/2017    Past Surgical History:  Procedure Laterality Date  . EYE SURGERY     gets therapy injections for vessel disease, hx/o cataract surgery  . MOUTH SURGERY       Current Outpatient Medications  Medication Sig Dispense Refill  . aspirin EC 81 MG tablet Take 1 tablet (81 mg total) by mouth daily. 90 tablet 3  . naproxen sodium (ALEVE) 220 MG tablet Take 220 mg by mouth.    . pravastatin  (PRAVACHOL) 20 MG tablet Take 1 tablet (20 mg total) by mouth at bedtime. 90 tablet 3  . sildenafil (REVATIO) 20 MG tablet 1 tablet (20mg ) prior to sexual activity daily prn 20 tablet 0   Current Facility-Administered Medications  Medication Dose Route Frequency Provider Last Rate Last Dose  . 0.9 %  sodium chloride infusion  500 mL Intravenous Once Armbruster, Carlota Raspberry, MD        Allergies:   Penicillins    Social History:  The patient  reports that he has been smoking cigarettes. He has a 50.00 pack-year smoking history. He has never used smokeless tobacco. He reports that he drinks alcohol. He reports that he does not use drugs.   Family History:  The patient's family history includes Other in his father and mother.    ROS:  Please see the history of present illness.   Otherwise, review of systems are positive for none.   All other systems are reviewed and negative.    PHYSICAL EXAM: VS:  BP (!) 144/86   Pulse 75   Ht 5\' 7"  (1.702 m)   Wt 169 lb (76.7 kg)   SpO2 99%   BMI 26.47 kg/m  , BMI Body mass index is 26.47 kg/m. Affect appropriate Healthy:  appears stated age 64: normal Neck supple with no adenopathy JVP normal no bruits no thyromegaly Lungs clear with no wheezing and  good diaphragmatic motion Heart:  S1/S2 no murmur, no rub, gallop or click PMI normal Abdomen: benighn, BS positve, no tenderness, no AAA Bilateral femoral  bruit.  No HSM or HJR Distal pulses diminished right more than left  No edema Neuro non-focal Skin warm and dry No muscular weakness    EKG:  SR LAFB poor R wave progression 02/19/17   Recent Labs: 02/22/2018: ALT 16; BUN 13; Creatinine, Ser 0.93; Hemoglobin 16.7; Platelets 310; Potassium 4.7; Sodium 143    Lipid Panel    Component Value Date/Time   CHOL 122 02/22/2018 1344   TRIG 81 02/22/2018 1344   HDL 30 (L) 02/22/2018 1344   CHOLHDL 4.1 02/22/2018 1344   CHOLHDL 5.4 (H) 02/18/2017 0939   VLDL 12 02/18/2017 0939   LDLCALC  76 02/22/2018 1344      Wt Readings from Last 3 Encounters:  07/28/18 169 lb (76.7 kg)  06/08/18 163 lb 12.8 oz (74.3 kg)  04/01/18 173 lb (78.5 kg)      Other studies Reviewed: Additional studies/ records that were reviewed today include: Notes Dr Glade Lloyd, LABS chest CT ECG and abdominal US .    ASSESSMENT AND PLAN:  1.  CAD seen on CT scan f/u exercise myovue to r/o obstructive disease  2. HLD:  Continue statin target LDL 70 or less 3. Smoking counseled on cessation and risk of progressive vascular disease which includes etiology of his erectile dysfunction  4. PVD:  Small vessel vascular disease responsible for ED and evidence on exam and screening AAA [place] for PVD with bilateral femoral bruits f/u LE arterial duplex and ABI's    Current medicines are reviewed at length with the patient today.  The patient does not have concerns regarding medicines.  The following changes have been made:  no change  Labs/ tests ordered today include: Ex Myovue ABI"s LE arterial duplex  No orders of the defined types were placed in this encounter.    Disposition:   FU with cardiology in a year     Signed, Jenkins Rouge, MD  07/28/2018 10:54 AM    [address], [place], [place]  [address] Phone: [phone]; Fax: [phone]

## 2018-08-03 ENCOUNTER — Telehealth (HOSPITAL_COMMUNITY): Payer: Self-pay | Admitting: *Deleted

## 2018-08-03 NOTE — Telephone Encounter (Signed)
Patient given detailed instructions per Myocardial Perfusion Study Information Sheet for the test on 08/08/18. Patient notified to arrive 15 minutes early and that it is imperative to arrive on time for appointment to keep from having the test rescheduled.  If you need to cancel or reschedule your appointment, please call the office within 24 hours of your appointment. . Patient verbalized understanding. Summer Parthasarathy Jacqueline    

## 2018-08-08 ENCOUNTER — Other Ambulatory Visit: Payer: Self-pay | Admitting: Cardiovascular Disease

## 2018-08-08 ENCOUNTER — Ambulatory Visit (HOSPITAL_COMMUNITY): Payer: Medicare HMO | Attending: Cardiology

## 2018-08-08 ENCOUNTER — Telehealth: Payer: Self-pay | Admitting: *Deleted

## 2018-08-08 DIAGNOSIS — I739 Peripheral vascular disease, unspecified: Secondary | ICD-10-CM

## 2018-08-08 DIAGNOSIS — I25118 Atherosclerotic heart disease of native coronary artery with other forms of angina pectoris: Secondary | ICD-10-CM | POA: Insufficient documentation

## 2018-08-08 LAB — MYOCARDIAL PERFUSION IMAGING
CHL CUP NUCLEAR SDS: 1
CHL CUP NUCLEAR SRS: 0
CHL CUP NUCLEAR SSS: 1
CSEPEW: 7 METS
CSEPPHR: 153 {beats}/min
Exercise duration (min): 6 min
Exercise duration (sec): 17 s
LV dias vol: 69 mL (ref 62–150)
LV sys vol: 26 mL
MPHR: 152 {beats}/min
NUC STRESS TID: 1.05
Percent HR: 100 %
Rest HR: 80 {beats}/min

## 2018-08-08 MED ORDER — TECHNETIUM TC 99M TETROFOSMIN IV KIT
10.6000 | PACK | Freq: Once | INTRAVENOUS | Status: AC | PRN
  Administered 2018-08-08: 10.6 via INTRAVENOUS
  Filled 2018-08-08: qty 11

## 2018-08-08 MED ORDER — TECHNETIUM TC 99M TETROFOSMIN IV KIT
31.6000 | PACK | Freq: Once | INTRAVENOUS | Status: AC | PRN
  Administered 2018-08-08: 31.6 via INTRAVENOUS
  Filled 2018-08-08: qty 32

## 2018-08-08 NOTE — Telephone Encounter (Signed)
Encounter not needed

## 2018-08-09 ENCOUNTER — Telehealth: Payer: Self-pay

## 2018-08-09 NOTE — Telephone Encounter (Signed)
Left message for patient to call back  

## 2018-08-09 NOTE — Telephone Encounter (Signed)
-----   Message from Josue Hector, MD sent at 08/09/2018  7:17 AM EDT ----- Normal myovue no ischemia or infarct EF 62%

## 2018-08-09 NOTE — Telephone Encounter (Signed)
Patient called back. Patient aware of myoview results.

## 2018-08-11 ENCOUNTER — Ambulatory Visit (HOSPITAL_COMMUNITY)
Admission: RE | Admit: 2018-08-11 | Discharge: 2018-08-11 | Disposition: A | Discharge status: Home / Self Care | Payer: Medicare HMO | Source: Ambulatory Visit | Attending: Cardiology | Admitting: Cardiology

## 2018-08-11 DIAGNOSIS — I739 Peripheral vascular disease, unspecified: Secondary | ICD-10-CM | POA: Insufficient documentation

## 2018-08-12 ENCOUNTER — Telehealth: Payer: Self-pay

## 2018-08-12 DIAGNOSIS — I70209 Unspecified atherosclerosis of native arteries of extremities, unspecified extremity: Secondary | ICD-10-CM

## 2018-08-12 NOTE — Telephone Encounter (Signed)
-----   Message from Josue Hector, MD sent at 08/12/2018  7:59 AM EDT ----- Occluded left SFA significant PVD refer to Ar[facility]

## 2018-08-12 NOTE — Telephone Encounter (Signed)
Patient aware of results. Referral placed. Made appointment with Dr. Fletcher Anon on 08/16/18.

## 2018-08-16 ENCOUNTER — Encounter: Payer: Self-pay | Admitting: Cardiovascular Disease

## 2018-08-16 ENCOUNTER — Ambulatory Visit: Payer: Medicare HMO | Admitting: Cardiovascular Disease

## 2018-08-16 VITALS — BP 145/78 | HR 60 | Ht 67.5 in | Wt 165.0 lb

## 2018-08-16 DIAGNOSIS — E782 Mixed hyperlipidemia: Secondary | ICD-10-CM | POA: Diagnosis not present

## 2018-08-16 DIAGNOSIS — R030 Elevated blood-pressure reading, without diagnosis of hypertension: Secondary | ICD-10-CM | POA: Diagnosis not present

## 2018-08-16 DIAGNOSIS — Z720 Tobacco use: Secondary | ICD-10-CM

## 2018-08-16 DIAGNOSIS — I739 Peripheral vascular disease, unspecified: Secondary | ICD-10-CM | POA: Diagnosis not present

## 2018-08-16 MED ORDER — ATORVASTATIN CALCIUM 40 MG PO TABS: 40.0000 mg | ORAL_TABLET | Freq: Every day | ORAL | 3 refills | Status: DC

## 2018-08-16 NOTE — Patient Instructions (Signed)
Medication Instructions:  Your physician has recommended you make the following change in your medication:  1) STOP Pravastatin  2) START Atorvastain 40 mg tablet by mouth ONCE daily with dinner  If you need a refill on your cardiac medications before your next appointment, please call your pharmacy.   Lab work: Your physician recommends that you return for lab work in: 6 weeks - FASTING  If you have labs (blood work) drawn today and your tests are completely normal, you will receive your results only by: Marland Kitchen MyChart Message (if you have MyChart) OR . A paper copy in the mail If you have any lab test that is abnormal or we need to change your treatment, we will call you to review the results.  Testing/Procedures: none  Follow-Up: At [facility], you and your health needs are our priority.  As part of our continuing mission to provide you with exceptional heart care, we have created designated Provider Care Teams.  These Care Teams include your primary Cardiologist (physician) and Advanced Practice Providers (APPs -  Physician Assistants and Nurse Practitioners) who all work together to provide you with the care you need, when you need it. You will need a follow up appointment in 3 months- PV patient.  Please call our office 2 months in advance to schedule this appointment.  You may see Dr. Fletcher Anon or one of the following Advanced Practice Providers on your designated Care Team:   Kerin Ransom, PA-C Roby Lofts, [place] . Sande Rives, PA-C  Any Other Special Instructions Will Be Listed Below (If Applicable).  Steps to Quit Smoking Smoking tobacco can be bad for your health. It can also affect almost every organ in your body. Smoking puts you and people around you at risk for many serious long-lasting (chronic) diseases. Quitting smoking is hard, but it is one of the best things that you can do for your health. It is never too late to quit. What are the benefits of quitting  smoking? When you quit smoking, you lower your risk for getting serious diseases and conditions. They can include:  Lung cancer or lung disease.  Heart disease.  Stroke.  Heart attack.  Not being able to have children (infertility).  Weak bones (osteoporosis) and broken bones (fractures).  If you have coughing, wheezing, and shortness of breath, those symptoms may get better when you quit. You may also get sick less often. If you are pregnant, quitting smoking can help to lower your chances of having a baby of low birth weight. What can I do to help me quit smoking? Talk with your doctor about what can help you quit smoking. Some things you can do (strategies) include:  Quitting smoking totally, instead of slowly cutting back how much you smoke over a period of time.  Going to in-person counseling. You are more likely to quit if you go to many counseling sessions.  Using resources and support systems, such as: ? Database administrator with a Social worker. ? Phone quitlines. ? Careers information officer. ? Support groups or group counseling. ? Text messaging programs. ? Mobile phone apps or applications.  Taking medicines. Some of these medicines may have nicotine in them. If you are pregnant or breastfeeding, do not take any medicines to quit smoking unless your doctor says it is okay. Talk with your doctor about counseling or other things that can help you.  Talk with your doctor about using more than one strategy at the same time, such as taking medicines while  you are also going to in-person counseling. This can help make quitting easier. What things can I do to make it easier to quit? Quitting smoking might feel very hard at first, but there is a lot that you can do to make it easier. Take these steps:  Talk to your family and friends. Ask them to support and encourage you.  Call phone quitlines, reach out to support groups, or work with a Social worker.  Ask people who smoke to not  smoke around you.  Avoid places that make you want (trigger) to smoke, such as: ? Bars. ? Parties. ? Smoke-break areas at work.  Spend time with people who do not smoke.  Lower the stress in your life. Stress can make you want to smoke. Try these things to help your stress: ? Getting regular exercise. ? Deep-breathing exercises. ? Yoga. ? Meditating. ? Doing a body scan. To do this, close your eyes, focus on one area of your body at a time from head to toe, and notice which parts of your body are tense. Try to relax the muscles in those areas.  Download or buy apps on your mobile phone or tablet that can help you stick to your quit plan. There are many free apps, such as QuitGuide from the [employer] Office manager for Disease Control and Prevention). You can find more support from smokefree.gov and other websites.  This information is not intended to replace advice given to you by your health care provider. Make sure you discuss any questions you have with your health care provider. Document Released: 07/25/2009 Document Revised: 05/26/2016 Document Reviewed: 02/12/2015 Elsevier Interactive Patient Education  2018 [place] with Quitting Smoking Quitting smoking is a physical and mental challenge. You will face cravings, withdrawal symptoms, and temptation. Before quitting, work with your health care provider to make a plan that can help you cope. Preparation can help you quit and keep you from giving in. How can I cope with cravings? Cravings usually last for 5-10 minutes. If you get through it, the craving will pass. Consider taking the following actions to help you cope with cravings:  Keep your mouth busy: ? Chew sugar-free gum. ? Suck on hard candies or a straw. ? Brush your teeth.  Keep your hands and body busy: ? Immediately change to a different activity when you feel a craving. ? Squeeze or play with a ball. ? Do an activity or a hobby, like making bead jewelry,  practicing needlepoint, or working with wood. ? Mix up your normal routine. ? Take a short exercise break. Go for a quick walk or run up and down stairs. ? Spend time in public places where smoking is not allowed.  Focus on doing something kind or helpful for someone else.  Call a friend or family member to talk during a craving.  Join a support group.  Call a quit line, such as 1-800-QUIT-NOW.  Talk with your health care provider about medicines that might help you cope with cravings and make quitting easier for you.  How can I deal with withdrawal symptoms? Your body may experience negative effects as it tries to get used to not having nicotine in the system. These effects are called withdrawal symptoms. They may include:  Feeling hungrier than normal.  Trouble concentrating.  Irritability.  Trouble sleeping.  Feeling depressed.  Restlessness and agitation.  Craving a cigarette.  To manage withdrawal symptoms:  Avoid places, people, and activities that trigger your cravings.  Remember why you want to quit.  Get plenty of sleep.  Avoid coffee and other caffeinated drinks. These may worsen some of your symptoms.  How can I handle social situations? Social situations can be difficult when you are quitting smoking, especially in the first few weeks. To manage this, you can:  Avoid parties, bars, and other social situations where people might be smoking.  Avoid alcohol.  Leave right away if you have the urge to smoke.  Explain to your family and friends that you are quitting smoking. Ask for understanding and support.  Plan activities with friends or family where smoking is not an option.  What are some ways I can cope with stress? Wanting to smoke may cause stress, and stress can make you want to smoke. Find ways to manage your stress. Relaxation techniques can help. For example:  Breathe slowly and deeply, in through your nose and out through your  mouth.  Listen to soothing, relaxing music.  Talk with a family member or friend about your stress.  Light a candle.  Soak in a bath or take a shower.  Think about a peaceful place.  What are some ways I can prevent weight gain? Be aware that many people gain weight after they quit smoking. However, not everyone does. To keep from gaining weight, have a plan in place before you quit and stick to the plan after you quit. Your plan should include:  Having healthy snacks. When you have a craving, it may help to: ? Eat plain popcorn, crunchy carrots, celery, or other cut vegetables. ? Chew sugar-free gum.  Changing how you eat: ? Eat small portion sizes at meals. ? Eat 4-6 small meals throughout the day instead of 1-2 large meals a day. ? Be mindful when you eat. Do not watch television or do other things that might distract you as you eat.  Exercising regularly: ? Make time to exercise each day. If you do not have time for a long workout, do short bouts of exercise for 5-10 minutes several times a day. ? Do some form of strengthening exercise, like weight lifting, and some form of aerobic exercise, like running or swimming.  Drinking plenty of water or other low-calorie or no-calorie drinks. Drink 6-8 glasses of water daily, or as much as instructed by your health care provider.  Summary  Quitting smoking is a physical and mental challenge. You will face cravings, withdrawal symptoms, and temptation to smoke again. Preparation can help you as you go through these challenges.  You can cope with cravings by keeping your mouth busy (such as by chewing gum), keeping your body and hands busy, and making calls to family, friends, or a helpline for people who want to quit smoking.  You can cope with withdrawal symptoms by avoiding places where people smoke, avoiding drinks with caffeine, and getting plenty of rest.  Ask your health care provider about the different ways to prevent weight  gain, avoid stress, and handle social situations. This information is not intended to replace advice given to you by your health care provider. Make sure you discuss any questions you have with your health care provider. Document Released: 09/25/2016 Document Revised: 09/25/2016 Document Reviewed: 09/25/2016 Elsevier Interactive Patient Education  [employer].

## 2018-08-16 NOTE — Progress Notes (Signed)
Cardiology Office Note   Date:  08/16/2018   ID:  Brent Espinoza May 09, 1951, MRN [id number]  PCP:  Carlena Hurl, PA-C  Cardiologist:   Kathlyn Sacramento, MD   Chief Complaint  Patient presents with  . Follow-up    abnormal LEA, pt denied chest pain,       History of Present Illness: Brent Espinoza is a 67 y.o. male who was referred by Dr. Johnsie Cancel for evaluation and management of peripheral arterial disease. The patient has prolonged history of smoking and hyperlipidemia.  He smokes 1 pack/day and has been doing so since 67 years old.  He has no family history of coronary artery disease.  He was recently referred to Dr. Johnsie Cancel after findings of coronary atherosclerosis and calcifications on CT scan of the lungs for cancer screening. The patient underwent a treadmill Myoview which showed no evidence of ischemia with normal ejection fraction.  During treadmill test, he complained of bilateral calf claudication.  He also started having symptoms of erectile dysfunction about 2 months ago.  He was referred for vascular evaluation which showed mild reduced ABI bilaterally in the 0.7 range.  Aortoiliac duplex showed severe bilateral common iliac artery stenosis in addition to an occluded left SFA. The patient describes mild bilateral calf claudication.  He was prescribed sildenafil for erectile dysfunction but has not started the medication yet.  He has no lower extremity ulceration.    Past Medical History:  Diagnosis Date  . Abnormal CT of the chest 06/08/2018  . Allergy    mild  . Atherosclerosis of aorta ([employer]) 06/08/2018  . Cataract    removed right eye  . Coronary artery disease involving native heart without angina pectoris 06/08/2018  . Erectile dysfunction 06/08/2018  . Eye disease 02/18/2017  . GERD (gastroesophageal reflux disease)    occ  . HDL deficiency 02/19/2017  . Hyperlipidemia   . Impacted cerumen of right ear 02/18/2017  . Leukocytosis 02/19/2017  . Mixed  dyslipidemia 02/19/2017  . Overweight 02/18/2017  . Smoking   . Tobacco use 02/18/2017    Past Surgical History:  Procedure Laterality Date  . EYE SURGERY     gets therapy injections for vessel disease, hx/o cataract surgery  . MOUTH SURGERY       Current Outpatient Medications  Medication Sig Dispense Refill  . aspirin EC 81 MG tablet Take 1 tablet (81 mg total) by mouth daily. 90 tablet 3  . naproxen sodium (ALEVE) 220 MG tablet Take 220 mg by mouth.    . pravastatin (PRAVACHOL) 20 MG tablet Take 1 tablet (20 mg total) by mouth at bedtime. 90 tablet 3  . sildenafil (REVATIO) 20 MG tablet 1 tablet (20mg ) prior to sexual activity daily prn 20 tablet 0   Current Facility-Administered Medications  Medication Dose Route Frequency Provider Last Rate Last Dose  . 0.9 %  sodium chloride infusion  500 mL Intravenous Once Armbruster, Carlota Raspberry, MD        Allergies:   Penicillins    Social History:  The patient  reports that he has been smoking cigarettes. He has a 50.00 pack-year smoking history. He has never used smokeless tobacco. He reports that he drinks alcohol. He reports that he does not use drugs.   Family History:  The patient's family history is negative for coronary artery disease, peripheral arterial disease or cancer.   ROS:  Please see the history of present illness.   Otherwise, review of systems are  positive for none.   All other systems are reviewed and negative.    PHYSICAL EXAM: VS:  BP (!) 145/78   Pulse 60   Ht 5' 7.5" (1.715 m)   Wt 165 lb (74.8 kg)   BMI 25.46 kg/m  , BMI Body mass index is 25.46 kg/m. GEN: Well nourished, well developed, in no acute distress  HEENT: normal  Neck: no JVD, carotid bruits, or masses Cardiac: RRR; no murmurs, rubs, or gallops,no edema  Respiratory:  clear to auscultation bilaterally, normal work of breathing GI: soft, nontender, nondistended, + BS MS: no deformity or atrophy  Skin: warm and dry, no rash Neuro:  Strength  and sensation are intact Psych: euthymic mood, full affect Vascular: Radial pulses normal bilaterally.  Femoral pulses barely palpable bilaterally.   EKG:  EKG is not ordered today.    Recent Labs: 02/22/2018: ALT 16; BUN 13; Creatinine, Ser 0.93; Hemoglobin 16.7; Platelets 310; Potassium 4.7; Sodium 143    Lipid Panel    Component Value Date/Time   CHOL 122 02/22/2018 1344   TRIG 81 02/22/2018 1344   HDL 30 (L) 02/22/2018 1344   CHOLHDL 4.1 02/22/2018 1344   CHOLHDL 5.4 (H) 02/18/2017 0939   VLDL 12 02/18/2017 0939   LDLCALC 76 02/22/2018 1344      Wt Readings from Last 3 Encounters:  08/16/18 165 lb (74.8 kg)  08/08/18 169 lb (76.7 kg)  07/28/18 169 lb (76.7 kg)       No flowsheet data found.    ASSESSMENT AND PLAN:  1.  Peripheral arterial disease: Currently with mild bilateral calf claudication.  In addition, I do agree that his erectile dysfunction is likely due to his peripheral arterial disease and significant common iliac artery stenosis bilaterally.  I discussed with him the natural history and management of claudication.  I discussed with him the importance of controlling his risk factors. I gave him the option of angiography and endovascular intervention to mainly treat his iliac disease given degree of stenosis, chance of progression to complete occlusion and also symptoms of erectile dysfunction.  The patient wants to wait for now.  2.  Tobacco use: I explained to him that smoking cessation is the most important step.  He wants to quit on his own.  3.  Hyperlipidemia: Given the presence of coronary atherosclerosis and significant peripheral arterial disease, the patient needs a more potent statin and thus I elected to switch him to atorvastatin 40 mg daily.  Repeat lipid and liver profile in 6 weeks.  4.  Elevated blood pressure without history of hypertension: I discussed with him the importance of exercise and low-sodium diet.  He    Disposition:   FU  with me in 3 months  Signed,  Kathlyn Sacramento, MD  08/16/2018 9:38 AM    Brent Espinoza

## 2018-08-24 ENCOUNTER — Telehealth: Payer: Self-pay | Admitting: Cardiovascular Disease

## 2018-08-24 NOTE — Telephone Encounter (Signed)
Did not call patient, not sure who called. Does not appear to have any pending results from any test. All test results have been given to patient.

## 2018-08-24 NOTE — Telephone Encounter (Signed)
Left message for pt with cash options with [facility]

## 2018-08-24 NOTE — Telephone Encounter (Signed)
Call placed to the patient. He stated that he was returning a call to [facility] [place] routed to her for her information.

## 2018-08-24 NOTE — Telephone Encounter (Signed)
New Message   Patient states that he received a phone call today and it only stated to contact us. Not sure as to what the call is in reference to possible his labs. Please call to discuss.

## 2018-08-24 NOTE — Telephone Encounter (Signed)
Returned call to patient.He stated he was returning someone's call from earlier today.After reviewing chart not able to tell who called him.Advised I will send message to Dr.Arida's RN.

## 2018-09-19 DIAGNOSIS — H4720 Unspecified optic atrophy: Secondary | ICD-10-CM | POA: Diagnosis not present

## 2018-09-19 DIAGNOSIS — H34811 Central retinal vein occlusion, right eye, with macular edema: Secondary | ICD-10-CM | POA: Diagnosis not present

## 2018-09-19 DIAGNOSIS — H35351 Cystoid macular degeneration, right eye: Secondary | ICD-10-CM | POA: Diagnosis not present

## 2018-09-19 DIAGNOSIS — H43811 Vitreous degeneration, right eye: Secondary | ICD-10-CM | POA: Diagnosis not present

## 2018-09-29 DIAGNOSIS — E782 Mixed hyperlipidemia: Secondary | ICD-10-CM | POA: Diagnosis not present

## 2018-09-29 DIAGNOSIS — I2510 Atherosclerotic heart disease of native coronary artery without angina pectoris: Secondary | ICD-10-CM | POA: Diagnosis not present

## 2018-09-29 LAB — HEPATIC FUNCTION PANEL
ALT: 16 IU/L (ref 0–44)
AST: 15 IU/L (ref 0–40)
Albumin: 4.3 g/dL (ref 3.6–4.8)
Alkaline Phosphatase: 99 IU/L (ref 39–117)
BILIRUBIN, DIRECT: 0.18 mg/dL (ref 0.00–0.40)
Bilirubin Total: 0.6 mg/dL (ref 0.0–1.2)
Total Protein: 7.1 g/dL (ref 6.0–8.5)

## 2018-09-29 LAB — LIPID PANEL
CHOLESTEROL TOTAL: 101 mg/dL (ref 100–199)
Chol/HDL Ratio: 3.5 ratio (ref 0.0–5.0)
HDL: 29 mg/dL — ABNORMAL LOW (ref >39)
LDL Calculated: 61 mg/dL (ref 0–99)
Triglycerides: 53 mg/dL (ref 0–149)
VLDL CHOLESTEROL CAL: 11 mg/dL (ref 5–40)

## 2018-09-30 ENCOUNTER — Telehealth: Payer: Self-pay | Admitting: *Deleted

## 2018-09-30 NOTE — Telephone Encounter (Signed)
-----   Message from Wellington Hampshire, MD sent at 09/30/2018  7:42 AM EST ----- Inform patient that labs were normal. Cholesterol was excellent except for low HDL.  This can be improved with exercise.  Continue same medications.

## 2018-09-30 NOTE — Telephone Encounter (Signed)
Left a message for the patient to call back.  

## 2018-09-30 NOTE — Telephone Encounter (Signed)
Patient returning call.

## 2018-10-03 NOTE — Telephone Encounter (Signed)
Patient made aware of results and verbalized understanding.  

## 2018-11-14 DIAGNOSIS — H34821 Venous engorgement, right eye: Secondary | ICD-10-CM | POA: Diagnosis not present

## 2018-11-14 DIAGNOSIS — H35371 Puckering of macula, right eye: Secondary | ICD-10-CM | POA: Diagnosis not present

## 2018-11-14 DIAGNOSIS — H34811 Central retinal vein occlusion, right eye, with macular edema: Secondary | ICD-10-CM | POA: Diagnosis not present

## 2018-11-14 DIAGNOSIS — H43811 Vitreous degeneration, right eye: Secondary | ICD-10-CM | POA: Diagnosis not present

## 2018-11-22 ENCOUNTER — Ambulatory Visit: Payer: Medicare HMO | Admitting: Cardiovascular Disease

## 2018-11-22 ENCOUNTER — Encounter: Payer: Self-pay | Admitting: Cardiovascular Disease

## 2018-11-22 VITALS — BP 142/76 | HR 57 | Ht 67.5 in | Wt 169.8 lb

## 2018-11-22 DIAGNOSIS — I739 Peripheral vascular disease, unspecified: Secondary | ICD-10-CM

## 2018-11-22 DIAGNOSIS — E785 Hyperlipidemia, unspecified: Secondary | ICD-10-CM

## 2018-11-22 DIAGNOSIS — Z720 Tobacco use: Secondary | ICD-10-CM | POA: Diagnosis not present

## 2018-11-22 NOTE — Progress Notes (Signed)
Cardiology Office Note   Date:  11/22/2018   ID:  Brent, Espinoza 07-15-1951, MRN [id number]  PCP:  Carlena Hurl, PA-C  Cardiologist:   Kathlyn Sacramento, MD   Chief Complaint  Patient presents with  . Follow-up    pt denied chest pain      History of Present Illness: Brent Espinoza is a 68 y.o. male who is here today for af follow up visit regarding PAD.  The patient has prolonged history of smoking and hyperlipidemia.  He smokes 1 pack/day and has been doing so since 68 years old.  He has no family history of coronary artery disease.   He was seen few months ago due to PAD with mild bilateral calf claudication.  This was also associated with development of erectile dysfunction. Vascular evaluation showed mild reduced ABI bilaterally in the 0.7 range.  Aortoiliac duplex showed severe bilateral common iliac artery stenosis in addition to an occluded left SFA. The patient did not want to have procedures done and this was treated medically.  He was switched to atorvastatin with improvement in his lipid profile. He reports stable symptoms overall.  At present time, he has minimal claudication but continues to suffer from erectile dysfunction in spite of using sildenafil.   Past Medical History:  Diagnosis Date  . Abnormal CT of the chest 06/08/2018  . Allergy    mild  . Atherosclerosis of aorta ([place]) 06/08/2018  . Cataract    removed right eye  . Coronary artery disease involving native heart without angina pectoris 06/08/2018  . Erectile dysfunction 06/08/2018  . Eye disease 02/18/2017  . GERD (gastroesophageal reflux disease)    occ  . HDL deficiency 02/19/2017  . Hyperlipidemia   . Impacted cerumen of right ear 02/18/2017  . Leukocytosis 02/19/2017  . Mixed dyslipidemia 02/19/2017  . Overweight 02/18/2017  . Smoking   . Tobacco use 02/18/2017    Past Surgical History:  Procedure Laterality Date  . EYE SURGERY     gets therapy injections for vessel disease, hx/o  cataract surgery  . MOUTH SURGERY       Current Outpatient Medications  Medication Sig Dispense Refill  . aspirin EC 81 MG tablet Take 1 tablet (81 mg total) by mouth daily. 90 tablet 3  . atorvastatin (LIPITOR) 40 MG tablet Take 1 tablet (40 mg total) by mouth daily. 90 tablet 3  . naproxen sodium (ALEVE) 220 MG tablet Take 220 mg by mouth.    . sildenafil (REVATIO) 20 MG tablet 1 tablet (20mg ) prior to sexual activity daily prn 20 tablet 0   Current Facility-Administered Medications  Medication Dose Route Frequency Provider Last Rate Last Dose  . 0.9 %  sodium chloride infusion  500 mL Intravenous Once Armbruster, Carlota Raspberry, MD        Allergies:   Penicillins    Social History:  The patient  reports that he has been smoking cigarettes. He has a 50.00 pack-year smoking history. He has never used smokeless tobacco. He reports current alcohol use. He reports that he does not use drugs.   Family History:  The patient's family history is negative for coronary artery disease, peripheral arterial disease or cancer.   ROS:  Please see the history of present illness.   Otherwise, review of systems are positive for none.   All other systems are reviewed and negative.    PHYSICAL EXAM: VS:  BP (!) 142/76   Pulse (!) 57  Ht 5' 7.5" (1.715 m)   Wt 169 lb 12.8 oz (77 kg)   BMI 26.20 kg/m  , BMI Body mass index is 26.2 kg/m. GEN: Well nourished, well developed, in no acute distress  HEENT: normal  Neck: no JVD, carotid bruits, or masses Cardiac: RRR; no murmurs, rubs, or gallops,no edema  Respiratory:  clear to auscultation bilaterally, normal work of breathing GI: soft, nontender, nondistended, + BS MS: no deformity or atrophy  Skin: warm and dry, no rash Neuro:  Strength and sensation are intact Psych: euthymic mood, full affect Vascular: Radial pulses normal bilaterally.  Femoral pulses barely palpable bilaterally.   EKG:  EKG is not ordered today.    Recent  Labs: 02/22/2018: BUN 13; Creatinine, Ser 0.93; Hemoglobin 16.7; Platelets 310; Potassium 4.7; Sodium 143 09/29/2018: ALT 16    Lipid Panel    Component Value Date/Time   CHOL 101 09/29/2018 0843   TRIG 53 09/29/2018 0843   HDL 29 (L) 09/29/2018 0843   CHOLHDL 3.5 09/29/2018 0843   CHOLHDL 5.4 (H) 02/18/2017 0939   VLDL 12 02/18/2017 0939   LDLCALC 61 09/29/2018 0843      Wt Readings from Last 3 Encounters:  11/22/18 169 lb 12.8 oz (77 kg)  08/16/18 165 lb (74.8 kg)  08/08/18 169 lb (76.7 kg)       No flowsheet data found.    ASSESSMENT AND PLAN:  1.  Peripheral arterial disease: Currently with mild bilateral calf claudication.  In addition, his erectile dysfunction is mostly caused by his peripheral arterial disease and iliac stenosis.  I again discussed with him the indications for angiography and revascularization but he prefers to continue medical therapy as he does not feel very limited by his symptoms.  2.  Tobacco use: I discussed with him the importance of smoking cessation but he reports inability to quit.    3.  Hyperlipidemia: Continue treatment with atorvastatin.  His LDL improved to 61 with this.  4.  Elevated blood pressure without history of hypertension: Blood pressure is still mildly elevated and might ultimately require medications.   Disposition:   FU with me in 6 months  Signed,  Kathlyn Sacramento, MD  11/22/2018 9:59 AM    [place]

## 2018-11-22 NOTE — Patient Instructions (Signed)
Medication Instructions:  No changes If you need a refill on your cardiac medications before your next appointment, please call your pharmacy.   Lab work: None ordered  Testing/Procedures: None ordered  Follow-Up: At [facility], you and your health needs are our priority.  As part of our continuing mission to provide you with exceptional heart care, we have created designated Provider Care Teams.  These Care Teams include your primary Cardiologist (physician) and Advanced Practice Providers (APPs -  Physician Assistants and Nurse Practitioners) who all work together to provide you with the care you need, when you need it. You will need a follow up appointment in 6 months.  Please call our office 2 months in advance to schedule this appointment.  You may see Dr. Arida or one of the following Advanced Practice Providers on your designated Care Team:  Luke Kilroy, PA-C Krista Kroeger, PA-C . Callie Goodrich, PA-C  Any Other Special Instructions Will Be Listed Below (If Applicable).  Steps to Quit Smoking  Smoking tobacco can be bad for your health. It can also affect almost every organ in your body. Smoking puts you and people around you at risk for many serious long-lasting (chronic) diseases. Quitting smoking is hard, but it is one of the best things that you can do for your health. It is never too late to quit. What are the benefits of quitting smoking? When you quit smoking, you lower your risk for getting serious diseases and conditions. They can include:  Lung cancer or lung disease.  Heart disease.  Stroke.  Heart attack.  Not being able to have children (infertility).  Weak bones (osteoporosis) and broken bones (fractures). If you have coughing, wheezing, and shortness of breath, those symptoms may get better when you quit. You may also get sick less often. If you are pregnant, quitting smoking can help to lower your chances of having a baby of low birth weight. What can  I do to help me quit smoking? Talk with your doctor about what can help you quit smoking. Some things you can do (strategies) include:  Quitting smoking totally, instead of slowly cutting back how much you smoke over a period of time.  Going to in-person counseling. You are more likely to quit if you go to many counseling sessions.  Using resources and support systems, such as: ? Online chats with a counselor. ? Phone quitlines. ? Printed self-help materials. ? Support groups or group counseling. ? Text messaging programs. ? Mobile phone apps or applications.  Taking medicines. Some of these medicines may have nicotine in them. If you are pregnant or breastfeeding, do not take any medicines to quit smoking unless your doctor says it is okay. Talk with your doctor about counseling or other things that can help you. Talk with your doctor about using more than one strategy at the same time, such as taking medicines while you are also going to in-person counseling. This can help make quitting easier. What things can I do to make it easier to quit? Quitting smoking might feel very hard at first, but there is a lot that you can do to make it easier. Take these steps:  Talk to your family and friends. Ask them to support and encourage you.  Call phone quitlines, reach out to support groups, or work with a counselor.  Ask people who smoke to not smoke around you.  Avoid places that make you want (trigger) to smoke, such as: ? Bars. ? Parties. ? Smoke-break   areas at work.  Spend time with people who do not smoke.  Lower the stress in your life. Stress can make you want to smoke. Try these things to help your stress: ? Getting regular exercise. ? Deep-breathing exercises. ? Yoga. ? Meditating. ? Doing a body scan. To do this, close your eyes, focus on one area of your body at a time from head to toe, and notice which parts of your body are tense. Try to relax the muscles in those  areas.  Download or buy apps on your mobile phone or tablet that can help you stick to your quit plan. There are many free apps, such as QuitGuide from the CDC (Centers for Disease Control and Prevention). You can find more support from smokefree.gov and other websites. This information is not intended to replace advice given to you by your health care provider. Make sure you discuss any questions you have with your health care provider. Document Released: 07/25/2009 Document Revised: 05/26/2016 Document Reviewed: 02/12/2015 Elsevier Interactive Patient Education  2019 Elsevier Inc.    

## 2019-01-16 DIAGNOSIS — H34811 Central retinal vein occlusion, right eye, with macular edema: Secondary | ICD-10-CM | POA: Diagnosis not present

## 2019-01-16 DIAGNOSIS — H3561 Retinal hemorrhage, right eye: Secondary | ICD-10-CM | POA: Diagnosis not present

## 2019-01-16 DIAGNOSIS — H43811 Vitreous degeneration, right eye: Secondary | ICD-10-CM | POA: Diagnosis not present

## 2019-01-16 DIAGNOSIS — H35351 Cystoid macular degeneration, right eye: Secondary | ICD-10-CM | POA: Diagnosis not present

## 2019-02-19 IMAGING — CT CT CHEST LUNG CANCER SCREENING LOW DOSE W/O CM
2 of 5 series · 14 of 40 positions shown, 17 images · non-contrast
Comparison: Low-dose lung cancer screening chest CT 03/09/2017.

CLINICAL DATA: 66-year-old male current smoker with 53 pack-year
history of smoking. Lung cancer screening examination.

EXAM:
CT CHEST WITHOUT CONTRAST LOW-DOSE FOR LUNG CANCER SCREENING
TECHNIQUE: Multidetector CT imaging of the chest was performed following the
standard protocol without IV contrast.

[Series 4: lung 1.00 br60 · axial · 0.68mm/px · z∈[-1097,-835]mm · 11 of 290 slices shown, 14 images]
[im 14/290  mediastinal]
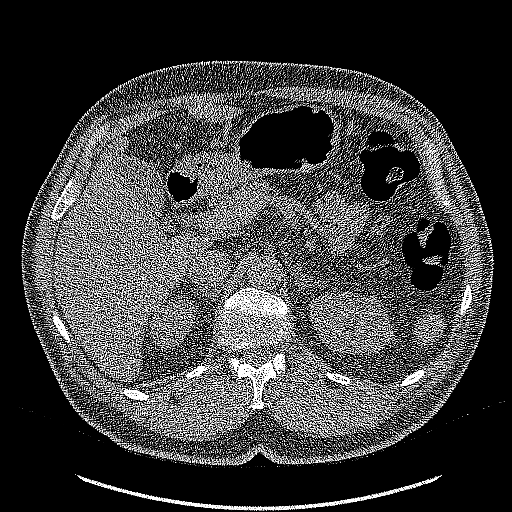
[im 14/290  lung]
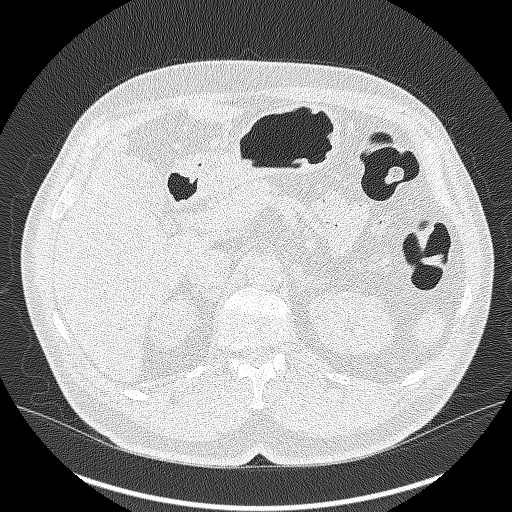
[im 42/290  lung]
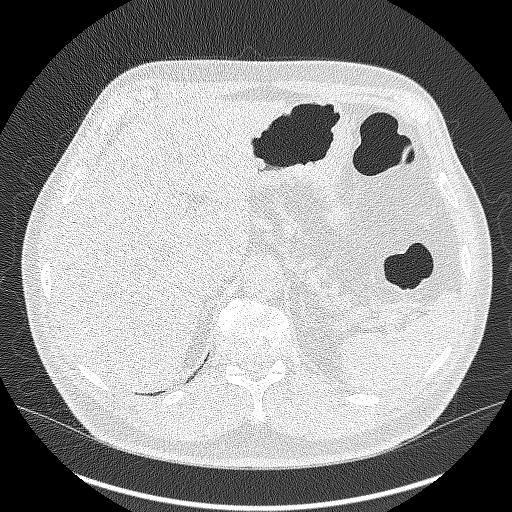
[im 69/290  lung]
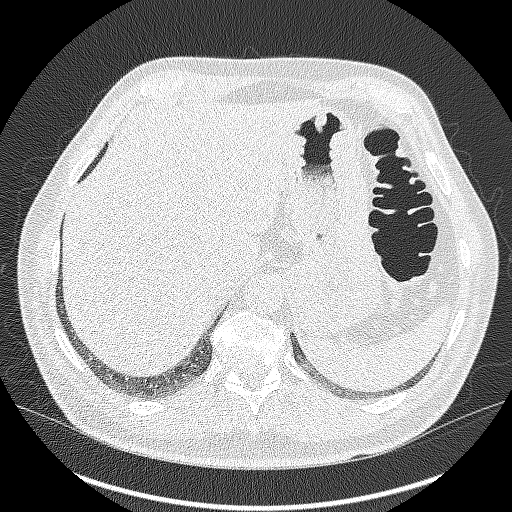
[im 97/290  lung]
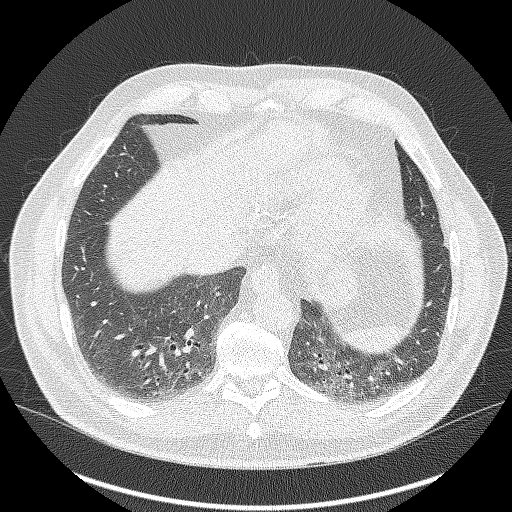
[im 124/290  mediastinal]
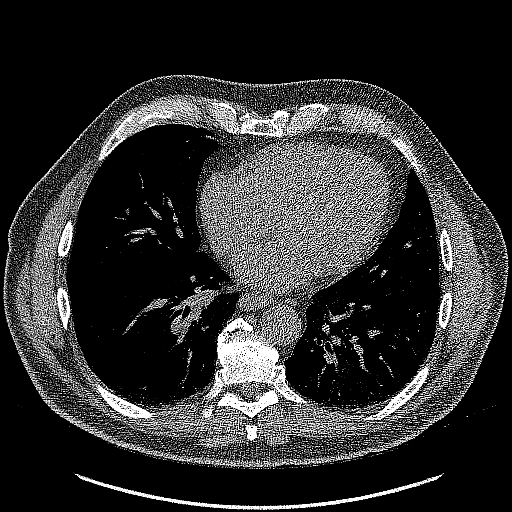
[im 124/290  lung]
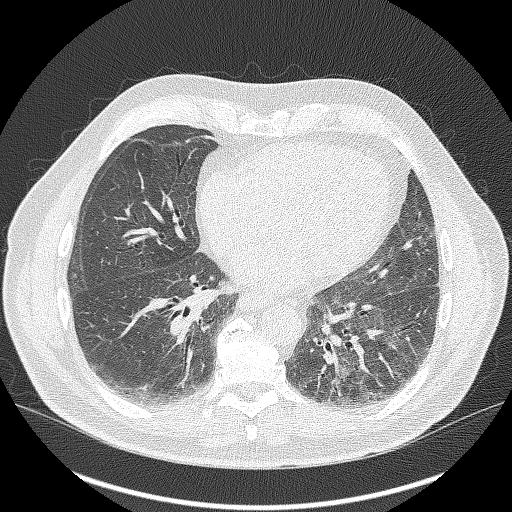
[im 152/290  lung]
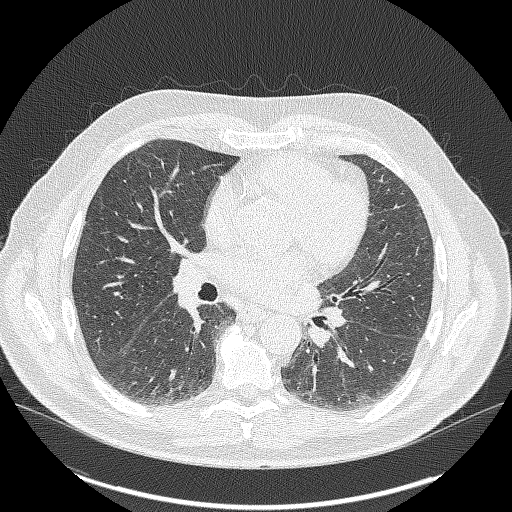
[im 166/290  lung]
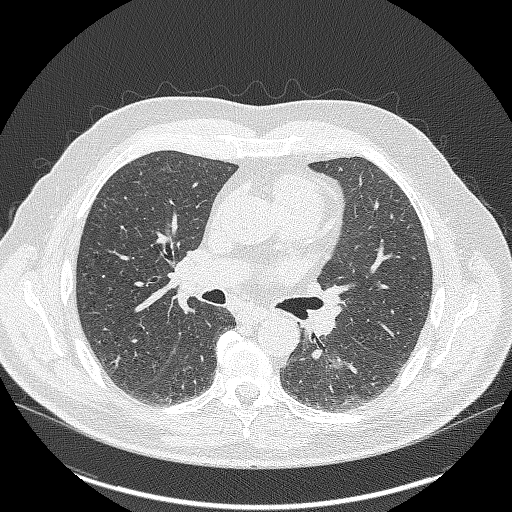
[im 193/290  lung]
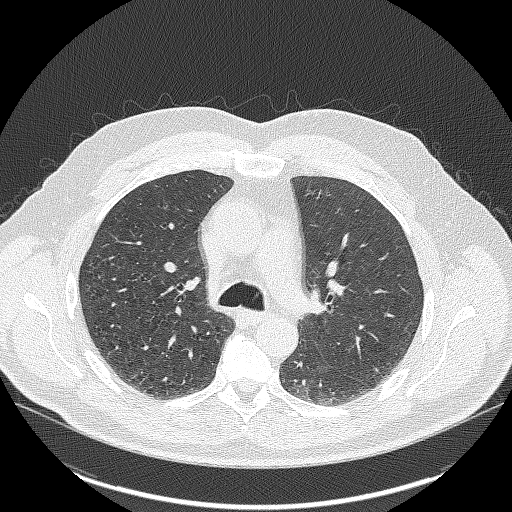
[im 221/290  mediastinal]
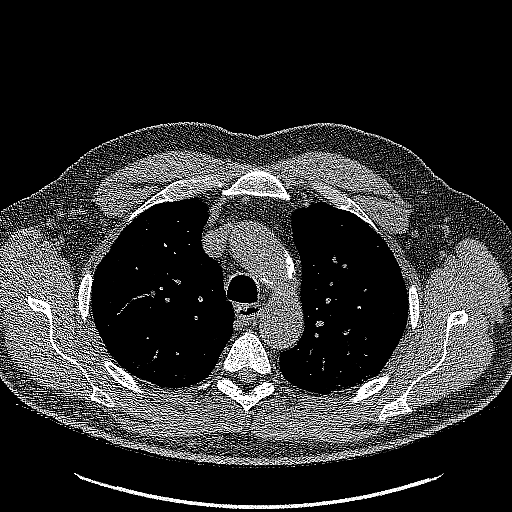
[im 221/290  lung]
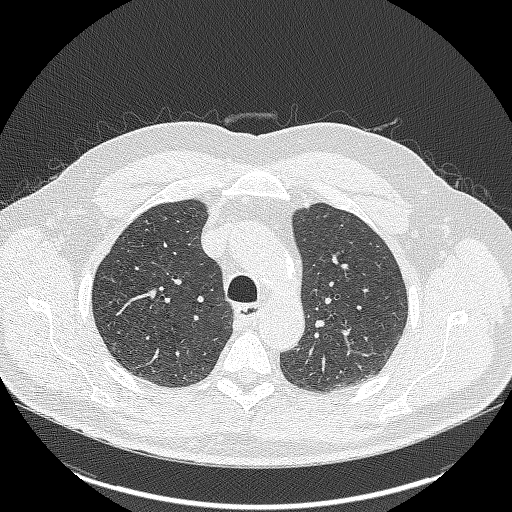
[im 248/290  lung]
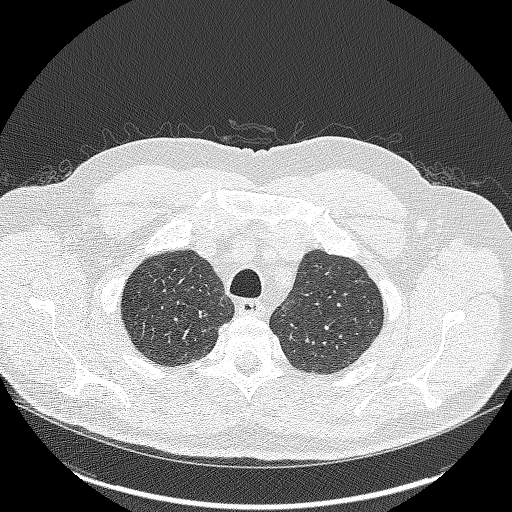
[im 276/290  lung]
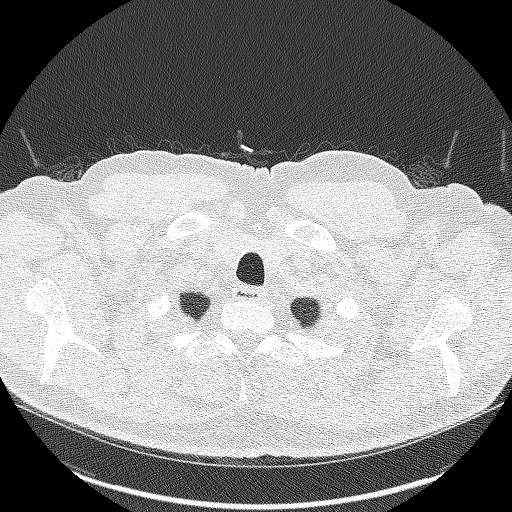

[Series 5: lung 1.00 br44 cor · coronal · 0.57mm/px · 3 of 361 slices shown]
[im 73/361  lung]
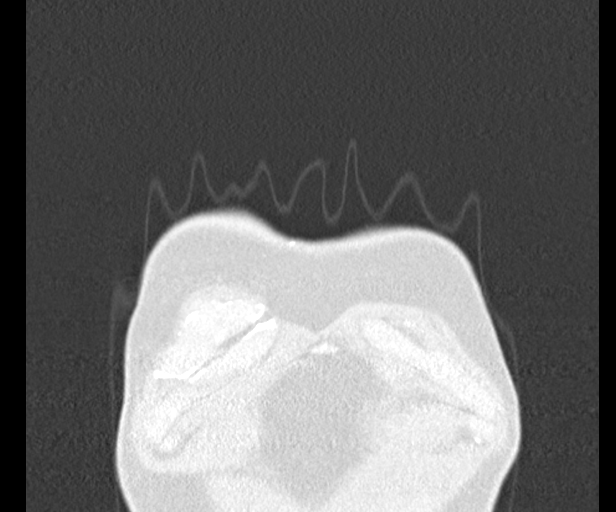
[im 145/361  lung]
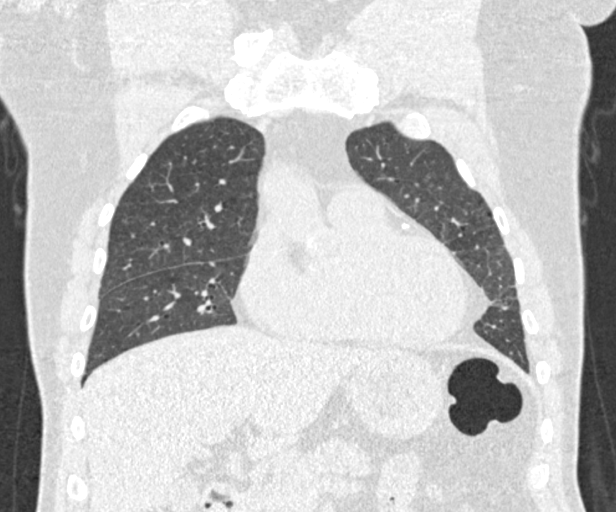
[im 217/361  lung]
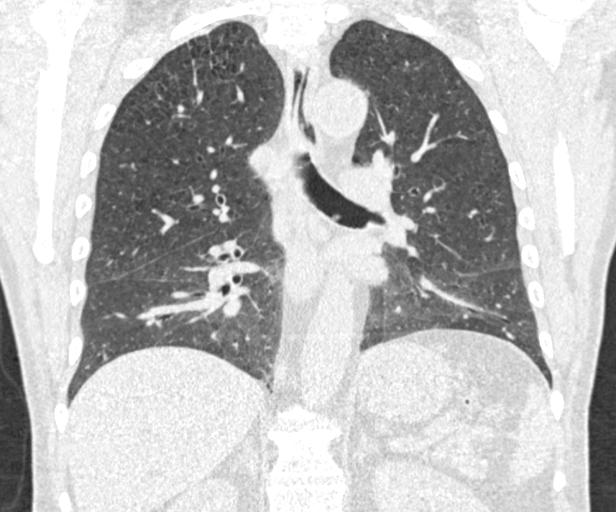

[14 of 40 positions shown; findings below may reference images not displayed]

FINDINGS: Cardiovascular: Heart size is normal. There is no significant
pericardial fluid, thickening or pericardial calcification. There is
aortic atherosclerosis, as well as atherosclerosis of the great
vessels of the mediastinum and the coronary arteries, including
calcified atherosclerotic plaque in the left main, left anterior
descending, left circumflex and right coronary arteries.

Mediastinum/Nodes: No pathologically enlarged mediastinal or hilar
lymph nodes. Please note that accurate exclusion of hilar adenopathy
is limited on noncontrast CT scans. Esophagus is unremarkable in
appearance. No axillary lymphadenopathy.

Lungs/Pleura: Tiny nodule in the right upper lobe near the apex
(axial image 36 of series 3), with a volume derived mean diameter of
2.3 mm. No larger more suspicious appearing pulmonary nodules or
masses are noted elsewhere. Previously noted nodules have resolved.
Several tiny calcified granulomas are also noted. No acute
consolidative airspace disease. No pleural effusions. Diffuse
bronchial wall thickening with mild centrilobular and paraseptal
emphysema.

Upper Abdomen: Unremarkable.

Musculoskeletal: There are no aggressive appearing lytic or blastic
lesions noted in the visualized portions of the skeleton.
IMPRESSION: 1. Lung-RADS 2S, benign appearance or behavior. Continue annual
screening with low-dose chest CT without contrast in 12 months.
2. The "S" modifier above refers to potentially clinically
significant non lung cancer related findings. Specifically, there is
aortic atherosclerosis, in addition to left main and 3 vessel
coronary artery disease. Please note that although the presence of
coronary artery calcium documents the presence of coronary artery
disease, the severity of this disease and any potential stenosis
cannot be assessed on this non-gated CT examination. Assessment for
potential risk factor modification, dietary therapy or pharmacologic
therapy may be warranted, if clinically indicated.
3. Mild diffuse bronchial wall thickening with mild centrilobular
and paraseptal emphysema; imaging findings suggestive of underlying
COPD.

Aortic Atherosclerosis (A41CL-T6U.U) and Emphysema (A41CL-AX1.J).

## 2019-03-13 DIAGNOSIS — T380X5A Adverse effect of glucocorticoids and synthetic analogues, initial encounter: Secondary | ICD-10-CM | POA: Diagnosis not present

## 2019-03-13 DIAGNOSIS — H43811 Vitreous degeneration, right eye: Secondary | ICD-10-CM | POA: Diagnosis not present

## 2019-03-13 DIAGNOSIS — H4720 Unspecified optic atrophy: Secondary | ICD-10-CM | POA: Diagnosis not present

## 2019-03-13 DIAGNOSIS — H35351 Cystoid macular degeneration, right eye: Secondary | ICD-10-CM | POA: Diagnosis not present

## 2019-03-13 DIAGNOSIS — H35371 Puckering of macula, right eye: Secondary | ICD-10-CM | POA: Diagnosis not present

## 2019-03-13 DIAGNOSIS — H34811 Central retinal vein occlusion, right eye, with macular edema: Secondary | ICD-10-CM | POA: Diagnosis not present

## 2019-05-01 DIAGNOSIS — H353114 Nonexudative age-related macular degeneration, right eye, advanced atrophic with subfoveal involvement: Secondary | ICD-10-CM | POA: Diagnosis not present

## 2019-05-01 DIAGNOSIS — H35371 Puckering of macula, right eye: Secondary | ICD-10-CM | POA: Diagnosis not present

## 2019-05-01 DIAGNOSIS — H34811 Central retinal vein occlusion, right eye, with macular edema: Secondary | ICD-10-CM | POA: Diagnosis not present

## 2019-06-13 ENCOUNTER — Other Ambulatory Visit: Payer: Self-pay

## 2019-06-13 ENCOUNTER — Encounter: Payer: Self-pay | Admitting: Cardiovascular Disease

## 2019-06-13 ENCOUNTER — Ambulatory Visit (INDEPENDENT_AMBULATORY_CARE_PROVIDER_SITE_OTHER): Payer: Medicare HMO | Admitting: Cardiovascular Disease

## 2019-06-13 VITALS — BP 148/84 | HR 54 | Temp 97.1°F | Ht 68.0 in | Wt 175.4 lb

## 2019-06-13 DIAGNOSIS — I739 Peripheral vascular disease, unspecified: Secondary | ICD-10-CM

## 2019-06-13 DIAGNOSIS — R030 Elevated blood-pressure reading, without diagnosis of hypertension: Secondary | ICD-10-CM

## 2019-06-13 DIAGNOSIS — E782 Mixed hyperlipidemia: Secondary | ICD-10-CM

## 2019-06-13 DIAGNOSIS — E785 Hyperlipidemia, unspecified: Secondary | ICD-10-CM

## 2019-06-13 DIAGNOSIS — Z720 Tobacco use: Secondary | ICD-10-CM | POA: Diagnosis not present

## 2019-06-13 MED ORDER — ATORVASTATIN CALCIUM 40 MG PO TABS: 40.0000 mg | ORAL_TABLET | Freq: Every day | ORAL | 3 refills | Status: DC

## 2019-06-13 NOTE — Patient Instructions (Signed)
Medication Instructions:  Your physician recommends that you continue on your current medications as directed. Please refer to the Current Medication list given to you today.  If you need a refill on your cardiac medications before your next appointment, please call your pharmacy.   Lab work: None ordered If you have labs (blood work) drawn today and your tests are completely normal, you will receive your results only by: MyChart Message (if you have MyChart) OR A paper copy in the mail If you have any lab test that is abnormal or we need to change your treatment, we will call you to review the results.  Testing/Procedures: None ordered  Follow-Up: At [facility], you and your health needs are our priority.  As part of our continuing mission to provide you with exceptional heart care, we have created designated Provider Care Teams.  These Care Teams include your primary Cardiologist (physician) and Advanced Practice Providers (APPs -  Physician Assistants and Nurse Practitioners) who all work together to provide you with the care you need, when you need it. You will need a follow up appointment in 6 months.  Please call our office 2 months in advance to schedule this appointment.  You may see Muhammad Arida, MD or one of the following Advanced Practice Providers on your designated Care Team:   Luke Kilroy, PA-C Krista Kroeger, PA-C Callie Goodrich, PA-C      

## 2019-06-13 NOTE — Progress Notes (Signed)
Cardiology Office Note   Date:  06/13/2019   ID:  Sumter, Gozman 1951/02/14, MRN [id number]  PCP:  Carlena Hurl, PA-C  Cardiologist:   Kathlyn Sacramento, MD   No chief complaint on file.     History of Present Illness: Brent Espinoza is a 68 y.o. male who is here today for af follow up visit regarding PAD.  The patient has prolonged history of smoking and hyperlipidemia.  He smokes 1 pack/day and has been doing so since 68 years old.  He has no family history of coronary artery disease.   He was seen few months ago due to PAD with mild bilateral calf claudication.  This was also associated with development of erectile dysfunction. Vascular evaluation showed mild reduced ABI bilaterally in the 0.7 range.  Aortoiliac duplex showed severe bilateral common iliac artery stenosis in addition to an occluded left SFA. The patient did not want to have procedures done and this was treated medically.  He was switched to atorvastatin with improvement in his lipid profile.  He reports minimal leg discomfort with walking and only happens after mowing the lawn for a long time.  The pain is usually in his hips.  No chest pain or shortness of breath.  He continues to smoke slightly less than a pack per day.  Past Medical History:  Diagnosis Date  . Abnormal CT of the chest 06/08/2018  . Allergy    mild  . Atherosclerosis of aorta ([place]) 06/08/2018  . Cataract    removed right eye  . Coronary artery disease involving native heart without angina pectoris 06/08/2018  . Erectile dysfunction 06/08/2018  . Eye disease 02/18/2017  . GERD (gastroesophageal reflux disease)    occ  . HDL deficiency 02/19/2017  . Hyperlipidemia   . Impacted cerumen of right ear 02/18/2017  . Leukocytosis 02/19/2017  . Mixed dyslipidemia 02/19/2017  . Overweight 02/18/2017  . Smoking   . Tobacco use 02/18/2017    Past Surgical History:  Procedure Laterality Date  . EYE SURGERY     gets therapy injections for  vessel disease, hx/o cataract surgery  . MOUTH SURGERY       Current Outpatient Medications  Medication Sig Dispense Refill  . aspirin EC 81 MG tablet Take 1 tablet (81 mg total) by mouth daily. 90 tablet 3  . atorvastatin (LIPITOR) 40 MG tablet Take 1 tablet (40 mg total) by mouth daily. 90 tablet 3  . naproxen sodium (ALEVE) 220 MG tablet Take 220 mg by mouth.    . sildenafil (REVATIO) 20 MG tablet 1 tablet (20mg ) prior to sexual activity daily prn 20 tablet 0   Current Facility-Administered Medications  Medication Dose Route Frequency Provider Last Rate Last Dose  . 0.9 %  sodium chloride infusion  500 mL Intravenous Once Armbruster, Carlota Raspberry, MD        Allergies:   Penicillins    Social History:  The patient  reports that he has been smoking cigarettes. He has a 50.00 pack-year smoking history. He has never used smokeless tobacco. He reports current alcohol use. He reports that he does not use drugs.   Family History:  The patient's family history is negative for coronary artery disease, peripheral arterial disease or cancer.   ROS:  Please see the history of present illness.   Otherwise, review of systems are positive for none.   All other systems are reviewed and negative.    PHYSICAL EXAM: VS:  BP Marland Kitchen)  148/84   Pulse (!) 54   Temp (!) 97.1 F (36.2 C)   Ht 5\' 8"  (1.727 m)   Wt 175 lb 6.4 oz (79.6 kg)   SpO2 99%   BMI 26.67 kg/m  , BMI Body mass index is 26.67 kg/m. GEN: Well nourished, well developed, in no acute distress  HEENT: normal  Neck: no JVD, carotid bruits, or masses Cardiac: RRR; no murmurs, rubs, or gallops,no edema  Respiratory:  clear to auscultation bilaterally, normal work of breathing GI: soft, nontender, nondistended, + BS MS: no deformity or atrophy  Skin: warm and dry, no rash Neuro:  Strength and sensation are intact Psych: euthymic mood, full affect Vascular: Radial pulses normal bilaterally.  Femoral pulses barely palpable bilaterally.    EKG:  EKG is ordered today. EKG showed sinus bradycardia with no significant ST or T wave changes.   Recent Labs: 09/29/2018: ALT 16    Lipid Panel    Component Value Date/Time   CHOL 101 09/29/2018 0843   TRIG 53 09/29/2018 0843   HDL 29 (L) 09/29/2018 0843   CHOLHDL 3.5 09/29/2018 0843   CHOLHDL 5.4 (H) 02/18/2017 0939   VLDL 12 02/18/2017 0939   LDLCALC 61 09/29/2018 0843      Wt Readings from Last 3 Encounters:  06/13/19 175 lb 6.4 oz (79.6 kg)  11/22/18 169 lb 12.8 oz (77 kg)  08/16/18 165 lb (74.8 kg)       No flowsheet data found.    ASSESSMENT AND PLAN:  1.  Peripheral arterial disease: Currently with mild bilateral leg claudication.  His symptoms do not seem to be lifestyle limiting and thus I recommend continuing medical therapy.  He does have significant erectile dysfunction which is likely due to peripheral arterial disease.  The patient opted for no procedures at the present time.   2.  Tobacco use: I again discussed with him the importance of smoking cessation.  3.  Hyperlipidemia: Continue treatment with atorvastatin.  His LDL improved to 61 with this.  4.  Elevated blood pressure without history of hypertension: Blood pressure continues to be elevated.  He reports that his blood pressure is typically around [id number] systolic.  Continue to monitor this for now and if it stays elevated, the plan is to start him on an ARB.    Disposition:   FU with me in 6 months  Signed,  Kathlyn Sacramento, MD  06/13/2019 9:39 AM    [place]

## 2019-06-20 DIAGNOSIS — H35351 Cystoid macular degeneration, right eye: Secondary | ICD-10-CM | POA: Diagnosis not present

## 2019-06-20 DIAGNOSIS — H34811 Central retinal vein occlusion, right eye, with macular edema: Secondary | ICD-10-CM | POA: Diagnosis not present

## 2019-06-20 DIAGNOSIS — H2512 Age-related nuclear cataract, left eye: Secondary | ICD-10-CM | POA: Diagnosis not present

## 2019-06-20 DIAGNOSIS — H353114 Nonexudative age-related macular degeneration, right eye, advanced atrophic with subfoveal involvement: Secondary | ICD-10-CM | POA: Diagnosis not present

## 2019-06-21 ENCOUNTER — Other Ambulatory Visit: Payer: Self-pay

## 2019-06-21 ENCOUNTER — Encounter: Payer: Self-pay | Admitting: Medical

## 2019-06-21 ENCOUNTER — Ambulatory Visit (INDEPENDENT_AMBULATORY_CARE_PROVIDER_SITE_OTHER): Payer: Medicare HMO | Admitting: Medical

## 2019-06-21 ENCOUNTER — Ambulatory Visit
Admission: RE | Admit: 2019-06-21 | Discharge: 2019-06-21 | Disposition: A | Discharge status: Home / Self Care | Payer: Medicare HMO | Source: Ambulatory Visit | Attending: Medical | Admitting: Medical

## 2019-06-21 VITALS — BP 148/82 | HR 57 | Temp 97.6°F | Ht 68.0 in | Wt 178.4 lb

## 2019-06-21 DIAGNOSIS — I700 Atherosclerosis of aorta: Secondary | ICD-10-CM | POA: Diagnosis not present

## 2019-06-21 DIAGNOSIS — M79645 Pain in left finger(s): Secondary | ICD-10-CM | POA: Insufficient documentation

## 2019-06-21 DIAGNOSIS — R030 Elevated blood-pressure reading, without diagnosis of hypertension: Secondary | ICD-10-CM | POA: Insufficient documentation

## 2019-06-21 DIAGNOSIS — F419 Anxiety disorder, unspecified: Secondary | ICD-10-CM

## 2019-06-21 DIAGNOSIS — Z720 Tobacco use: Secondary | ICD-10-CM

## 2019-06-21 DIAGNOSIS — I2510 Atherosclerotic heart disease of native coronary artery without angina pectoris: Secondary | ICD-10-CM

## 2019-06-21 DIAGNOSIS — E782 Mixed hyperlipidemia: Secondary | ICD-10-CM

## 2019-06-21 DIAGNOSIS — M25532 Pain in left wrist: Secondary | ICD-10-CM

## 2019-06-21 DIAGNOSIS — I739 Peripheral vascular disease, unspecified: Secondary | ICD-10-CM | POA: Diagnosis not present

## 2019-06-21 DIAGNOSIS — M1812 Unilateral primary osteoarthritis of first carpometacarpal joint, left hand: Secondary | ICD-10-CM | POA: Diagnosis not present

## 2019-06-21 NOTE — Patient Instructions (Addendum)
Please go to [facility] for your left thumb and wrist xray.   Their hours are 8am - 3:30 pm Monday - Friday.  Take your insurance card with you.  [facility] [phone]  [address], [place], [place] [address]  [address], [place] [address]   Recommendations:  Get a thumb spica splint and use this daily for the next week  Use ice or bag of frozen peas 20 minutes on, 20 minutes off  Use over the counter Aleve, 1 or 2 tablets up to twice daily for the next 5-7 days  Rest the thumb  We will call with xray results.

## 2019-06-21 NOTE — Progress Notes (Signed)
Subjective: Chief Complaint  Patient presents with  . Hand Pain    left   Here for pain and swelling of left thumb.  He notes usually taking aleve resolves the symptoms.   Pain can be intermittent or sometimes constant.   No specific injury or trauma.   Is right handed.  Is retired but does yard work.   No repetitive activity recently.  No other significant joints issues.   Swelling at times, but pain is regular. For months.  No other aggravating or relieving factors.   Saw cardiologist last week, and one medication was changed. No hx/o HTN or BP medication.  He notes last week cardiology said BP was up but not to be too concerned.    Has concerns about anxiety.  Feeling a little anxious of late, no panic attacks. No prior medication.   Has some hobbies.  Watches some news.    No other complaint.  Past Medical History:  Diagnosis Date  . Abnormal CT of the chest 06/08/2018  . Allergy    mild  . Atherosclerosis of aorta ([place]) 06/08/2018  . Cataract    removed right eye  . Coronary artery disease involving native heart without angina pectoris 06/08/2018  . Erectile dysfunction 06/08/2018  . Eye disease 02/18/2017  . GERD (gastroesophageal reflux disease)    occ  . HDL deficiency 02/19/2017  . Hyperlipidemia   . Impacted cerumen of right ear 02/18/2017  . Leukocytosis 02/19/2017  . Mixed dyslipidemia 02/19/2017  . Overweight 02/18/2017  . Smoking   . Tobacco use 02/18/2017   Current Outpatient Medications on File Prior to Visit  Medication Sig Dispense Refill  . aspirin EC 81 MG tablet Take 1 tablet (81 mg total) by mouth daily. 90 tablet 3  . atorvastatin (LIPITOR) 40 MG tablet Take 1 tablet (40 mg total) by mouth daily. 90 tablet 3  . naproxen sodium (ALEVE) 220 MG tablet Take 220 mg by mouth.    . sildenafil (REVATIO) 20 MG tablet 1 tablet (20mg ) prior to sexual activity daily prn 20 tablet 0   Current Facility-Administered Medications on File Prior to Visit  Medication Dose Route  Frequency Provider Last Rate Last Dose  . 0.9 %  sodium chloride infusion  500 mL Intravenous Once Armbruster, Carlota Raspberry, MD       ROS as in subjective    Objective: BP (!) 148/82   Pulse (!) 57   Temp 97.6 F (36.4 C) (Oral)   Ht 5\' 8"  (1.727 m)   Wt 178 lb 6.4 oz (80.9 kg)   SpO2 97%   BMI 27.13 kg/m   BP Readings from Last 3 Encounters:  06/21/19 (!) 148/82  06/13/19 (!) 148/84  11/22/18 (!) 142/76   Gen: wd, wn, nad Left thumb tender over base of thumb, pain with thumb ROM in general, mild localized swelling of base of thumb, no laxity, otherwise nontender, normal ROM of fingers, no other deformity Wrists with mild tenderness over left lateral wrist, but full ROM Arms neurovascularly intact     Assessment Encounter Diagnoses  Name Primary?  . Pain of left thumb Yes  . Left wrist pain   . Elevated blood-pressure reading without diagnosis of hypertension   . Coronary artery disease involving native heart without angina pectoris, unspecified vessel or lesion type   . Atherosclerosis of aorta ([place])   . Mixed dyslipidemia   . Tobacco use   . PAD (peripheral artery disease) ([place])   . Anxiety  Plan: Discussed possible differential.     Patient Instructions  Please go to [facility] for your left thumb and wrist xray.   Their hours are 8am - 3:30 pm Monday - Friday.  Take your insurance card with you.  [facility] [phone]  [address] [address], [place], [place] [address]  [address], [place] [address]   Recommendations:  Get a thumb spica splint and use this daily for the next week  Use ice or bag of frozen peas 20 minutes on, 20 minutes off  Use over the counter Aleve, 1 or 2 tablets up to twice daily for the next 5-7 days  Rest the thumb  We will call with xray results.       Reviewed his recent cardiology office notes.  He was advised to c/t same medication, monitor BPs at home that are reportedly 130/80s.   He has f/u with cardiology planned  Anxiety - counseled on strategies to deal with anxiety including stress reduction, hobbies, exercise, counseling.   after discussing he declines medication for now.  Consider prozac.    Brent Espinoza was seen today for hand pain.  Diagnoses and all orders for this visit:  Pain of left thumb -     DG Finger Thumb Left; Future -     DG Wrist Complete Left; Future  Left wrist pain -     DG Finger Thumb Left; Future -     DG Wrist Complete Left; Future  Elevated blood-pressure reading without diagnosis of hypertension  Coronary artery disease involving native heart without angina pectoris, unspecified vessel or lesion type  Atherosclerosis of aorta ([facility])  Mixed dyslipidemia  Tobacco use  PAD (peripheral artery disease) (HCC)  Anxiety

## 2019-07-19 ENCOUNTER — Ambulatory Visit (INDEPENDENT_AMBULATORY_CARE_PROVIDER_SITE_OTHER): Payer: Medicare HMO | Admitting: Medical

## 2019-07-19 ENCOUNTER — Encounter: Payer: Self-pay | Admitting: Medical

## 2019-07-19 ENCOUNTER — Other Ambulatory Visit: Payer: Self-pay

## 2019-07-19 VITALS — BP 146/84 | HR 67 | Temp 98.2°F | Ht 68.0 in | Wt 177.8 lb

## 2019-07-19 DIAGNOSIS — Z7189 Other specified counseling: Secondary | ICD-10-CM

## 2019-07-19 DIAGNOSIS — I2510 Atherosclerotic heart disease of native coronary artery without angina pectoris: Secondary | ICD-10-CM

## 2019-07-19 DIAGNOSIS — F419 Anxiety disorder, unspecified: Secondary | ICD-10-CM

## 2019-07-19 DIAGNOSIS — Z720 Tobacco use: Secondary | ICD-10-CM

## 2019-07-19 DIAGNOSIS — I739 Peripheral vascular disease, unspecified: Secondary | ICD-10-CM | POA: Diagnosis not present

## 2019-07-19 DIAGNOSIS — Z0000 Encounter for general adult medical examination without abnormal findings: Secondary | ICD-10-CM | POA: Diagnosis not present

## 2019-07-19 DIAGNOSIS — I700 Atherosclerosis of aorta: Secondary | ICD-10-CM

## 2019-07-19 DIAGNOSIS — D72829 Elevated white blood cell count, unspecified: Secondary | ICD-10-CM | POA: Diagnosis not present

## 2019-07-19 DIAGNOSIS — Z131 Encounter for screening for diabetes mellitus: Secondary | ICD-10-CM | POA: Diagnosis not present

## 2019-07-19 DIAGNOSIS — M79645 Pain in left finger(s): Secondary | ICD-10-CM | POA: Diagnosis not present

## 2019-07-19 DIAGNOSIS — E786 Lipoprotein deficiency: Secondary | ICD-10-CM

## 2019-07-19 DIAGNOSIS — E782 Mixed hyperlipidemia: Secondary | ICD-10-CM | POA: Diagnosis not present

## 2019-07-19 DIAGNOSIS — M25532 Pain in left wrist: Secondary | ICD-10-CM | POA: Diagnosis not present

## 2019-07-19 DIAGNOSIS — Z125 Encounter for screening for malignant neoplasm of prostate: Secondary | ICD-10-CM | POA: Diagnosis not present

## 2019-07-19 DIAGNOSIS — R030 Elevated blood-pressure reading, without diagnosis of hypertension: Secondary | ICD-10-CM

## 2019-07-19 DIAGNOSIS — R9389 Abnormal findings on diagnostic imaging of other specified body structures: Secondary | ICD-10-CM

## 2019-07-19 DIAGNOSIS — N529 Male erectile dysfunction, unspecified: Secondary | ICD-10-CM

## 2019-07-19 DIAGNOSIS — Z7185 Encounter for immunization safety counseling: Secondary | ICD-10-CM

## 2019-07-19 DIAGNOSIS — H579 Unspecified disorder of eye and adnexa: Secondary | ICD-10-CM

## 2019-07-19 NOTE — Progress Notes (Addendum)
Subjective:    Brent Espinoza is a 68 y.o. male who presents for Preventative Services visit and chronic medical problems/med check visit.    Primary Care Provider Tysinger, Camelia Eng, PA-C here for primary care  Current Health Care Team:  Dentist, Dr. Vale Haven   Eye doctor, Dr. Katy Fitch   Dr. Kathlyn Sacramento, cardiology  Dr. Somerset Cellar, [place] you may have received from other than [facility] providers in the past year (date may be approximate) cardiology  Exercise Current exercise habits: mows lawn for 3 hours every other week   Nutrition/Diet Current diet: well balanced  Depression Screen Depression screen [facility] 2/9 07/19/2019  Decreased Interest 0  Down, Depressed, Hopeless 0  PHQ - 2 Score 0    Activities of Daily Living Screen/Functional Status Survey Is the patient deaf or have difficulty hearing?: No Does the patient have difficulty seeing, even when wearing glasses/contacts?: No Does the patient have difficulty concentrating, remembering, or making decisions?: No Does the patient have difficulty walking or climbing stairs?: No Does the patient have difficulty dressing or bathing?: No Does the patient have difficulty doing errands alone such as visiting a doctor's office or shopping?: No  Can patient draw a clock face showing 3:15 oclock, yes  Fall Risk Screen Fall Risk  07/19/2019 02/22/2018 02/18/2017  Falls in the past year? 0 No No    Gait Assessment: Normal gait observed yes  Advanced directives Does patient have a [place]? No Does patient have a Living Will? Yes  Past Medical History:  Diagnosis Date  . Abnormal CT of the chest 06/08/2018  . Allergy    mild  . Atherosclerosis of aorta ([place]) 06/08/2018  . Cataract    removed right eye  . Coronary artery disease involving native heart without angina pectoris 06/08/2018  . Erectile dysfunction 06/08/2018  . Eye disease 02/18/2017  . GERD (gastroesophageal reflux  disease)    occ  . HDL deficiency 02/19/2017  . Hyperlipidemia   . Impacted cerumen of right ear 02/18/2017  . Leukocytosis 02/19/2017  . Mixed dyslipidemia 02/19/2017  . Overweight 02/18/2017  . Smoking   . Tobacco use 02/18/2017    Past Surgical History:  Procedure Laterality Date  . EYE SURGERY     gets therapy injections for vessel disease, hx/o cataract surgery  . MOUTH SURGERY      Social History   Socioeconomic History  . Marital status: Divorced    Spouse name: Not on file  . Number of children: Not on file  . Years of education: Not on file  . Highest education level: Not on file  Occupational History  . Not on file  Social Needs  . Financial resource strain: Not on file  . Food insecurity    Worry: Not on file    Inability: Not on file  . Transportation needs    Medical: Not on file    Non-medical: Not on file  Tobacco Use  . Smoking status: Current Every Day Smoker    Packs/day: 1.00    Years: 50.00    Pack years: 50.00    Types: Cigarettes  . Smokeless tobacco: Never Used  Substance and Sexual Activity  . Alcohol use: Yes    Comment: occ  . Drug use: No  . Sexual activity: Not on file  Lifestyle  . Physical activity    Days per week: Not on file    Minutes per session: Not on file  .  Stress: Not on file  Relationships  . Social Herbalist on phone: Not on file    Gets together: Not on file    Attends religious service: Not on file    Active member of club or organization: Not on file    Attends meetings of clubs or organizations: Not on file    Relationship status: Not on file  . Intimate partner violence    Fear of current or ex partner: Not on file    Emotionally abused: Not on file    Physically abused: Not on file    Forced sexual activity: Not on file  Other Topics Concern  . Not on file  Social History Narrative   Alone.   Retired as of 2018.   Was at [employer].   Exercise - yard work, Stage manager around.  Eats relatively healthy.    Has 2 children, 1 in [place], 1 in [place], 4 grandchildren.  02/2017    Family History  Problem Relation Age of Onset  . Other Mother        eye disease, died of old age  . Other Father        health history unknown  . Cancer Neg Hx   . Diabetes Neg Hx   . Heart disease Neg Hx   . Stroke Neg Hx   . Hyperlipidemia Neg Hx   . Hypertension Neg Hx   . Colon cancer Neg Hx   . Colon polyps Neg Hx   . Rectal cancer Neg Hx   . Stomach cancer Neg Hx      Current Outpatient Medications:  .  aspirin EC 81 MG tablet, Take 1 tablet (81 mg total) by mouth daily., Disp: 90 tablet, Rfl: 3 .  atorvastatin (LIPITOR) 40 MG tablet, Take 1 tablet (40 mg total) by mouth daily., Disp: 90 tablet, Rfl: 3 .  naproxen sodium (ALEVE) 220 MG tablet, Take 220 mg by mouth., Disp: , Rfl:  .  sildenafil (REVATIO) 20 MG tablet, 1 tablet (20mg ) prior to sexual activity daily prn, Disp: 20 tablet, Rfl: 0  Current Facility-Administered Medications:  .  0.9 %  sodium chloride infusion, 500 mL, Intravenous, Once, Armbruster, Carlota Raspberry, MD  Allergies  Allergen Reactions  . Penicillins     As child     History reviewed: allergies, current medications, past family history, past medical history, past social history, past surgical history and problem list  Chronic issues discussed: Left thumb pain discussed at recent visit.  No other c/o  Acute issues discussed: none  Objective:      Biometrics BP (!) 146/84   Pulse 67   Temp 98.2 F (36.8 C)   Ht 5\' 8"  (1.727 m)   Wt 177 lb 12.8 oz (80.6 kg)   SpO2 97%   BMI 27.03 kg/m   BP Readings from Last 3 Encounters:  07/19/19 (!) 146/84  06/21/19 (!) 148/82  06/13/19 (!) 148/84   Wt Readings from Last 3 Encounters:  07/19/19 177 lb 12.8 oz (80.6 kg)  06/21/19 178 lb 6.4 oz (80.9 kg)  06/13/19 175 lb 6.4 oz (79.6 kg)   Cognitive Testing  Alert? Yes  Normal Appearance?Yes  Oriented to person? Yes  Place? Yes   Time? Yes  Recall of three  objects?  Yes  Can perform simple calculations? Yes  Displays appropriate judgment?Yes  Can read the correct time from a watch face?Yes  Gen: wd, wn, nad, white male HEENT: normocephalic, sclerae anicteric,pearly TMs,  nares patent, no discharge or erythema, pharynx normal Skin: scattered macules, SK on right supraspinatus region and left upper back, no specific worrisome lesions, scar left cheek from prior bio spy Oral cavity: MMM, no lesions Neck: supple, no lymphadenopathy, no thyromegaly, no masses, no bruits Heart: RRR, normal S1, S2, no murmurs Lungs: CTA bilaterally, no wheezes, rhonchi, or rales Abdomen: +bs, soft, non tender, non distended, no masses, no hepatomegaly, no splenomegaly Musculoskeletal:non tender, no swelling, no obvious deformity Extremities: no edema, no cyanosis, no clubbing Pulses: 2+ symmetric, upper and 1+ bilat lower extremities, normal cap refill Neurological: alert, oriented x 3, CN2-12 intact, strength normal upper extremities and lower extremities, sensation normal throughout, DTRs 2+ throughout, no cerebellar signs, gait normal Psychiatric: normal affect, behavior normal, pleasant GU: declined DRE: refused   Assessment:   Encounter Diagnoses  Name Primary?  . Encounter for health maintenance examination in adult Yes  . Coronary artery disease involving native heart without angina pectoris, unspecified vessel or lesion type   . PAD (peripheral artery disease) ([place])   . Atherosclerosis of aorta ([place])   . Tobacco use   . Anxiety   . Elevated blood-pressure reading without diagnosis of hypertension   . Pain of left thumb   . Left wrist pain   . Abnormal CT of the chest   . Erectile dysfunction, unspecified erectile dysfunction type   . Mixed dyslipidemia   . HDL deficiency   . Leukocytosis, unspecified type   . Vaccine counseling   . Eye disease   . Screening for prostate cancer      Plan:   A preventative services visit was completed  today.  During the course of the visit today, we discussed and counseled about appropriate screening and preventive services.  A health risk assessment was established today that included a review of current medications, allergies, social history, family history, medical and preventative health history, biometrics, and preventative screenings to identify potential safety concerns or impairments.  A personalized plan was printed today for your records and use.   Personalized health advice and education was given today to reduce health risks and promote self management and wellness.  Information regarding end of life planning was discussed today.  Conditions/risks identified: Noncompliance with diet, continues to smoke  Cancer screening: reviewed 2019 colonoscopy PSA level today, discussed risks/benefits Lung cancer screening - reviewed 2019 CT, but he declines repeat yearly or now although advised   Chronic problems discussed today: CAD, PAD, atherosclerosis, tobacco use - he saw cardiology in follow up 06/2019.   I reviewed notes.  advised he stop tobacco, c/t statin, c/t aspirin daily.  He has abnormal ABI, has severe bilateral common iliac artery stenosis in addition to occluded left SFA.  He declines procedural interventions.  His claudication has improved on the recent change in statin.   He doesn't seem to be interested in quitting tobacco or the 8 white powdered donuts he eats every morning.  Elevated BP - advised he begin ARB.   He declines and will monitor BPs over the next few weeks and get back in touch with [place].  disucssed goal to be <130/80.    Abnormal chest CT 2019.  Advised repeat CT now, but he declines.  Discussed purpose of lung cancer screening with chest CT, his CT showing COPD.    Leukocytosis - labs today  arthritis left thumb - advised prn use of ice, thumb spica splint, tylenol prn.  Consider ortho consult, but he declines today  Acute  problems discussed today: none   Recommendations:  I recommend a yearly ophthalmology/optometry visit for glaucoma screening and eye checkup  I recommended a yearly dental visit for hygiene and checkup  Advanced directives - discussed nature and purpose of Advanced Directives, encouraged them to complete them if they have not done so and/or encouraged them to get [place] a copy if they have done this already.  Referrals today: None  [place] was seen today for medicare wellness.  Diagnoses and all orders for this visit:  Encounter for health maintenance examination in adult -     Comprehensive metabolic panel -     CBC -     Lipid panel -     Hemoglobin A1c -     PSA  Coronary artery disease involving native heart without angina pectoris, unspecified vessel or lesion type  PAD (peripheral artery disease) (HCC) -     Lipid panel  Atherosclerosis of aorta (HCC) -     Comprehensive metabolic panel -     Lipid panel  Tobacco use  Anxiety  Elevated blood-pressure reading without diagnosis of hypertension  Pain of left thumb  Left wrist pain  Abnormal CT of the chest  Erectile dysfunction, unspecified erectile dysfunction type  Mixed dyslipidemia -     Comprehensive metabolic panel  HDL deficiency  Leukocytosis, unspecified type -     CBC  Vaccine counseling  Eye disease  Screening for prostate cancer -     PSA     Immunizations: Counseled on vaccines He declines pneumococcal 23 He declines shingrix He notes getting flu shot 06/13/19 at [facility]. He notes getting Td vaccine 4-5 years ago   Medicare Attestation A preventative services visit was completed today.  During the course of the visit the patient was educated and counseled about appropriate screening and preventive services.  A health risk assessment was established with the patient that included a review of current medications, allergies, social history, family history, medical and preventative health history, biometrics, and  preventative screenings to identify potential safety concerns or impairments.  A personalized plan was printed today for the patient's records and use.   Personalized health advice and education was given today to reduce health risks and promote self management and wellness.  Information regarding end of life planning was discussed today.  Dorothea Ogle, PA-C   07/19/2019

## 2019-07-20 ENCOUNTER — Encounter: Payer: Self-pay | Admitting: Medical

## 2019-07-20 LAB — LIPID PANEL
Chol/HDL Ratio: 3.4 ratio (ref 0.0–5.0)
Cholesterol, Total: 101 mg/dL (ref 100–199)
HDL: 30 mg/dL — ABNORMAL LOW (ref >39)
LDL Chol Calc (NIH): 57 mg/dL (ref 0–99)
Triglycerides: 62 mg/dL (ref 0–149)
VLDL Cholesterol Cal: 14 mg/dL (ref 5–40)

## 2019-07-20 LAB — COMPREHENSIVE METABOLIC PANEL
ALT: 19 IU/L (ref 0–44)
AST: 21 IU/L (ref 0–40)
Albumin/Globulin Ratio: 1.4 (ref 1.2–2.2)
Albumin: 4.6 g/dL (ref 3.8–4.8)
Alkaline Phosphatase: 118 IU/L — ABNORMAL HIGH (ref 39–117)
BUN/Creatinine Ratio: 16 (ref 10–24)
BUN: 15 mg/dL (ref 8–27)
Bilirubin Total: 0.4 mg/dL (ref 0.0–1.2)
CO2: 24 mmol/L (ref 20–29)
Calcium: 9.8 mg/dL (ref 8.6–10.2)
Chloride: 99 mmol/L (ref 96–106)
Creatinine, Ser: 0.92 mg/dL (ref 0.76–1.27)
GFR calc Af Amer: 98 mL/min/{1.73_m2} (ref >59)
GFR calc non Af Amer: 85 mL/min/{1.73_m2} (ref >59)
Globulin, Total: 3.2 g/dL (ref 1.5–4.5)
Glucose: 124 mg/dL — ABNORMAL HIGH (ref 65–99)
Potassium: 4.6 mmol/L (ref 3.5–5.2)
Sodium: 137 mmol/L (ref 134–144)
Total Protein: 7.8 g/dL (ref 6.0–8.5)

## 2019-07-20 LAB — CBC
Hematocrit: 46.6 % (ref 37.5–51.0)
Hemoglobin: 16.4 g/dL (ref 13.0–17.7)
MCH: 29.6 pg (ref 26.6–33.0)
MCHC: 35.2 g/dL (ref 31.5–35.7)
MCV: 84 fL (ref 79–97)
Platelets: 302 10*3/uL (ref 150–450)
RBC: 5.54 x10E6/uL (ref 4.14–5.80)
RDW: 13.2 % (ref 11.6–15.4)
WBC: 10.2 10*3/uL (ref 3.4–10.8)

## 2019-07-20 LAB — HEMOGLOBIN A1C
Est. average glucose Bld gHb Est-mCnc: 134 mg/dL
Hgb A1c MFr Bld: 6.3 % — ABNORMAL HIGH (ref 4.8–5.6)

## 2019-07-20 LAB — PSA: Prostate Specific Ag, Serum: 2.6 ng/mL (ref 0.0–4.0)

## 2019-07-20 MED ORDER — ASPIRIN EC 81 MG PO TBEC: 81.0000 mg | DELAYED_RELEASE_TABLET | Freq: Every day | ORAL | 3 refills | Status: DC

## 2019-07-20 NOTE — Addendum Note (Signed)
Addended by: Carlena Hurl on: 07/20/2019 09:58 AM   Modules accepted: Orders

## 2019-08-01 ENCOUNTER — Other Ambulatory Visit: Payer: Self-pay | Admitting: Cardiovascular Disease

## 2019-08-01 DIAGNOSIS — Z720 Tobacco use: Secondary | ICD-10-CM

## 2019-08-01 DIAGNOSIS — E782 Mixed hyperlipidemia: Secondary | ICD-10-CM

## 2019-08-01 MED ORDER — ATORVASTATIN CALCIUM 40 MG PO TABS: 40.0000 mg | ORAL_TABLET | Freq: Every day | ORAL | 3 refills | Status: DC

## 2019-08-01 NOTE — Telephone Encounter (Signed)
Pt's medication was sent to pt's pharmacy as requested. Confirmation received.  °

## 2019-08-07 DIAGNOSIS — H35371 Puckering of macula, right eye: Secondary | ICD-10-CM | POA: Diagnosis not present

## 2019-08-07 DIAGNOSIS — H34811 Central retinal vein occlusion, right eye, with macular edema: Secondary | ICD-10-CM | POA: Diagnosis not present

## 2019-09-25 DIAGNOSIS — H34811 Central retinal vein occlusion, right eye, with macular edema: Secondary | ICD-10-CM | POA: Diagnosis not present

## 2019-09-25 DIAGNOSIS — H2512 Age-related nuclear cataract, left eye: Secondary | ICD-10-CM | POA: Diagnosis not present

## 2019-09-25 DIAGNOSIS — H43811 Vitreous degeneration, right eye: Secondary | ICD-10-CM | POA: Diagnosis not present

## 2019-11-13 DIAGNOSIS — H2512 Age-related nuclear cataract, left eye: Secondary | ICD-10-CM | POA: Diagnosis not present

## 2019-11-13 DIAGNOSIS — H353114 Nonexudative age-related macular degeneration, right eye, advanced atrophic with subfoveal involvement: Secondary | ICD-10-CM | POA: Diagnosis not present

## 2019-11-13 DIAGNOSIS — H43811 Vitreous degeneration, right eye: Secondary | ICD-10-CM | POA: Diagnosis not present

## 2019-11-13 DIAGNOSIS — H34811 Central retinal vein occlusion, right eye, with macular edema: Secondary | ICD-10-CM | POA: Diagnosis not present

## 2020-01-01 DIAGNOSIS — H35351 Cystoid macular degeneration, right eye: Secondary | ICD-10-CM | POA: Diagnosis not present

## 2020-01-01 DIAGNOSIS — H353114 Nonexudative age-related macular degeneration, right eye, advanced atrophic with subfoveal involvement: Secondary | ICD-10-CM | POA: Diagnosis not present

## 2020-01-01 DIAGNOSIS — H34811 Central retinal vein occlusion, right eye, with macular edema: Secondary | ICD-10-CM | POA: Diagnosis not present

## 2020-01-01 DIAGNOSIS — H2512 Age-related nuclear cataract, left eye: Secondary | ICD-10-CM | POA: Diagnosis not present

## 2020-01-30 ENCOUNTER — Encounter: Payer: Self-pay | Admitting: Cardiovascular Disease

## 2020-01-30 ENCOUNTER — Ambulatory Visit: Payer: Medicare HMO | Admitting: Cardiovascular Disease

## 2020-01-30 ENCOUNTER — Other Ambulatory Visit: Payer: Self-pay

## 2020-01-30 VITALS — BP 138/80 | HR 54 | Ht 68.0 in | Wt 179.2 lb

## 2020-01-30 DIAGNOSIS — E782 Mixed hyperlipidemia: Secondary | ICD-10-CM | POA: Diagnosis not present

## 2020-01-30 DIAGNOSIS — I739 Peripheral vascular disease, unspecified: Secondary | ICD-10-CM | POA: Diagnosis not present

## 2020-01-30 DIAGNOSIS — Z720 Tobacco use: Secondary | ICD-10-CM

## 2020-01-30 MED ORDER — ATORVASTATIN CALCIUM 40 MG PO TABS: 40.0000 mg | ORAL_TABLET | Freq: Every day | ORAL | 3 refills | Status: DC

## 2020-01-30 NOTE — Progress Notes (Signed)
Cardiology Office Note   Date:  01/30/2020   ID:  Korben, Mcosker 1950-11-07, MRN [id number]  PCP:  Carlena Hurl, PA-C  Cardiologist:   Kathlyn Sacramento, MD   No chief complaint on file.     History of Present Illness: Brent Espinoza is a 69 y.o. male who is here today for af follow up visit regarding PAD.  The patient has prolonged history of smoking and hyperlipidemia.  He smokes 1 pack/day and has been doing so since 69 years old.  He has no family history of coronary artery disease.  He also has known history of coronary artery calcifications noted on prior CT scan He has known history of peripheral arterial disease with mild bilateral calf claudication and erectile dysfunction.   He had noninvasive vascular studies done in 2019 which showed mildly reduced ABI bilaterally in the 0.7 range.  Aortoiliac duplex showed severe bilateral common iliac artery stenosis in addition to an occluded left SFA. The patient did not want to have procedures done and this was treated medically.  He was switched to atorvastatin with improvement in his lipid profile.  He continues to report mild bilateral leg claudication and does not feel very limited by this.  He denies chest pain or worsening dyspnea.   Past Medical History:  Diagnosis Date  . Abnormal CT of the chest 06/08/2018  . Allergy    mild  . Atherosclerosis of aorta ([place]) 06/08/2018  . Cataract    removed right eye  . Coronary artery disease involving native heart without angina pectoris 06/08/2018  . Erectile dysfunction 06/08/2018  . Eye disease 02/18/2017  . GERD (gastroesophageal reflux disease)    occ  . HDL deficiency 02/19/2017  . Hyperlipidemia   . Impacted cerumen of right ear 02/18/2017  . Leukocytosis 02/19/2017  . Mixed dyslipidemia 02/19/2017  . Overweight 02/18/2017  . Smoking   . Tobacco use 02/18/2017    Past Surgical History:  Procedure Laterality Date  . EYE SURGERY     gets therapy injections for  vessel disease, hx/o cataract surgery  . MOUTH SURGERY       Current Outpatient Medications  Medication Sig Dispense Refill  . aspirin EC 81 MG tablet Take 1 tablet (81 mg total) by mouth daily. 90 tablet 3  . atorvastatin (LIPITOR) 40 MG tablet Take 1 tablet (40 mg total) by mouth daily. 90 tablet 3  . naproxen sodium (ALEVE) 220 MG tablet Take 220 mg by mouth.     Current Facility-Administered Medications  Medication Dose Route Frequency Provider Last Rate Last Admin  . 0.9 %  sodium chloride infusion  500 mL Intravenous Once Armbruster, Carlota Raspberry, MD        Allergies:   Penicillins    Social History:  The patient  reports that he has been smoking cigarettes. He has a 50.00 pack-year smoking history. He has never used smokeless tobacco. He reports current alcohol use. He reports that he does not use drugs.   Family History:  The patient's family history is negative for coronary artery disease, peripheral arterial disease or cancer.   ROS:  Please see the history of present illness.   Otherwise, review of systems are positive for none.   All other systems are reviewed and negative.    PHYSICAL EXAM: VS:  BP 138/80   Pulse (!) 54   Ht 5\' 8"  (1.727 m)   Wt 179 lb 3.2 oz (81.3 kg)   SpO2 97%  BMI 27.25 kg/m  , BMI Body mass index is 27.25 kg/m. GEN: Well nourished, well developed, in no acute distress  HEENT: normal  Neck: no JVD, carotid bruits, or masses Cardiac: RRR; no murmurs, rubs, or gallops,no edema  Respiratory:  clear to auscultation bilaterally, normal work of breathing GI: soft, nontender, nondistended, + BS MS: no deformity or atrophy  Skin: warm and dry, no rash Neuro:  Strength and sensation are intact Psych: euthymic mood, full affect Vascular: Radial pulses normal bilaterally.  Femoral pulses barely palpable bilaterally.   EKG:  EKG is ordered today. EKG showed sinus bradycardia with poor R wave progression in the anterior leads.   Recent  Labs: 07/19/2019: ALT 19; BUN 15; Creatinine, Ser 0.92; Hemoglobin 16.4; Platelets 302; Potassium 4.6; Sodium 137    Lipid Panel    Component Value Date/Time   CHOL 101 07/19/2019 1056   TRIG 62 07/19/2019 1056   HDL 30 (L) 07/19/2019 1056   CHOLHDL 3.4 07/19/2019 1056   CHOLHDL 5.4 (H) 02/18/2017 0939   VLDL 12 02/18/2017 0939   LDLCALC 57 07/19/2019 1056      Wt Readings from Last 3 Encounters:  01/30/20 179 lb 3.2 oz (81.3 kg)  07/19/19 177 lb 12.8 oz (80.6 kg)  06/21/19 178 lb 6.4 oz (80.9 kg)       No flowsheet data found.    ASSESSMENT AND PLAN:  1.  Peripheral arterial disease: Currently with mild bilateral leg claudication.  His symptoms do not seem to be lifestyle limiting and thus I recommend continuing medical therapy.  He does have significant erectile dysfunction which is likely due to peripheral arterial disease.    2.  Tobacco use: I again discussed with him the importance of smoking cessation.  3.  Hyperlipidemia: Continue treatment with atorvastatin.  Most recent lipid profile showed an LDL of 57.  4.  Coronary atherosclerosis noted on previous CT scan: Currently with no convincing symptoms of angina.    Disposition:   FU with me in 12 months  Signed,  Kathlyn Sacramento, MD  01/30/2020 10:12 AM    [place]

## 2020-01-30 NOTE — Patient Instructions (Signed)
Medication Instructions:  Continue same medications *If you need a refill on your cardiac medications before your next appointment, please call your pharmacy*   Lab Work: None ordered    Testing/Procedures: None ordered   Follow-Up: At [facility], you and your health needs are our priority.  As part of our continuing mission to provide you with exceptional heart care, we have created designated Provider Care Teams.  These Care Teams include your primary Cardiologist (physician) and Advanced Practice Providers (APPs -  Physician Assistants and Nurse Practitioners) who all work together to provide you with the care you need, when you need it.  We recommend signing up for the patient portal called "MyChart".  Sign up information is provided on this After Visit Summary.  MyChart is used to connect with patients for Virtual Visits (Telemedicine).  Patients are able to view lab/test results, encounter notes, upcoming appointments, etc.  Non-urgent messages can be sent to your provider as well.   To learn more about what you can do with MyChart, go to [email or web address].    Your next appointment:  1 year   Call in Feb to schedule April   The format for your next appointment:Office     Provider:  Dr.Arida

## 2020-02-26 ENCOUNTER — Encounter (INDEPENDENT_AMBULATORY_CARE_PROVIDER_SITE_OTHER): Payer: Self-pay | Admitting: Ophthalmology

## 2020-02-26 ENCOUNTER — Other Ambulatory Visit: Payer: Self-pay

## 2020-02-26 ENCOUNTER — Ambulatory Visit (INDEPENDENT_AMBULATORY_CARE_PROVIDER_SITE_OTHER): Payer: Medicare HMO | Admitting: Ophthalmology

## 2020-02-26 DIAGNOSIS — H353114 Nonexudative age-related macular degeneration, right eye, advanced atrophic with subfoveal involvement: Secondary | ICD-10-CM | POA: Diagnosis not present

## 2020-02-26 DIAGNOSIS — H34811 Central retinal vein occlusion, right eye, with macular edema: Secondary | ICD-10-CM

## 2020-02-26 DIAGNOSIS — H43822 Vitreomacular adhesion, left eye: Secondary | ICD-10-CM

## 2020-02-26 MED ORDER — AFLIBERCEPT 2MG/0.05ML IZ SOLN FOR KALEIDOSCOPE
2.0000 mg | INTRAVITREAL | Status: AC | PRN
  Administered 2020-02-26: 2 mg via INTRAVITREAL

## 2020-02-26 NOTE — Progress Notes (Signed)
02/26/2020     CHIEF COMPLAINT Patient presents for Retina Follow Up   HISTORY OF PRESENT ILLNESS: Brent Espinoza is a 69 y.o. male who presents to the clinic today for:   HPI    Retina Follow Up    Patient presents with  CRVO/BRVO.  In right eye.  Duration of 8 weeks.  Since onset it is stable.          Comments    8 week follow up- OCT OU, Possible Eylea OD Patient denies change in vision and overall has no complaints.        Last edited by Gerda Diss on 02/26/2020  8:48 AM. (History)      Referring physician: Carlena Hurl, PA-C [place],  [place] [address]  HISTORICAL INFORMATION:   Selected notes from the MEDICAL RECORD NUMBER    Lab Results  Component Value Date   HGBA1C 6.3 (H) 07/19/2019     CURRENT MEDICATIONS: No current outpatient medications on file. (Ophthalmic Drugs)   No current facility-administered medications for this visit. (Ophthalmic Drugs)   Current Outpatient Medications (Other)  Medication Sig  . aspirin EC 81 MG tablet Take 1 tablet (81 mg total) by mouth daily.  Marland Kitchen atorvastatin (LIPITOR) 40 MG tablet Take 1 tablet (40 mg total) by mouth daily.  . naproxen sodium (ALEVE) 220 MG tablet Take 220 mg by mouth.   Current Facility-Administered Medications (Other)  Medication Route  . 0.9 %  sodium chloride infusion Intravenous      REVIEW OF SYSTEMS:    ALLERGIES Allergies  Allergen Reactions  . Penicillins     As child     PAST MEDICAL HISTORY Past Medical History:  Diagnosis Date  . Abnormal CT of the chest 06/08/2018  . Allergy    mild  . Atherosclerosis of aorta ([place]) 06/08/2018  . Cataract    removed right eye  . Coronary artery disease involving native heart without angina pectoris 06/08/2018  . Erectile dysfunction 06/08/2018  . Eye disease 02/18/2017  . GERD (gastroesophageal reflux disease)    occ  . HDL deficiency 02/19/2017  . Hyperlipidemia   . Impacted cerumen of right ear 02/18/2017    . Leukocytosis 02/19/2017  . Mixed dyslipidemia 02/19/2017  . Overweight 02/18/2017  . Smoking   . Tobacco use 02/18/2017   Past Surgical History:  Procedure Laterality Date  . EYE SURGERY     gets therapy injections for vessel disease, hx/o cataract surgery  . MOUTH SURGERY      FAMILY HISTORY Family History  Problem Relation Age of Onset  . Other Mother        eye disease, died of old age  . Other Father        health history unknown  . Cancer Neg Hx   . Diabetes Neg Hx   . Heart disease Neg Hx   . Stroke Neg Hx   . Hyperlipidemia Neg Hx   . Hypertension Neg Hx   . Colon cancer Neg Hx   . Colon polyps Neg Hx   . Rectal cancer Neg Hx   . Stomach cancer Neg Hx     SOCIAL HISTORY Social History   Tobacco Use  . Smoking status: Current Every Day Smoker    Packs/day: 1.00    Years: 50.00    Pack years: 50.00    Types: Cigarettes  . Smokeless tobacco: Never Used  Substance Use Topics  . Alcohol use: Yes  Comment: occ  . Drug use: No         OPHTHALMIC EXAM: Base Eye Exam    Visual Acuity (Snellen - Linear)      Right Left   Dist cc CF @ 6' 20/20-2   Dist ph cc 20/400        Tonometry (Tonopen, 8:54 AM)      Right Left   Pressure 19 18       Pupils      Pupils Dark Light Shape React APD   Right PERRL 4 3 Round Slow None   Left PERRL 4 3 Round Slow None       Visual Fields (Counting fingers)      Left Right    Full Full       Extraocular Movement      Right Left    Full Full       Neuro/Psych    Oriented x3: Yes   Mood/Affect: Normal       Dilation    Right eye: 1.0% Mydriacyl, 2.5% Phenylephrine @ 8:54 AM        Slit Lamp and Fundus Exam    External Exam      Right Left   External Normal Normal       Slit Lamp Exam      Right Left   Lids/Lashes Normal Normal   Conjunctiva/Sclera White and quiet White and quiet   Cornea Clear Clear   Anterior Chamber Deep and quiet Deep and quiet   Iris Round and reactive Round and  reactive   Lens Posterior chamber intraocular lens    Anterior Vitreous Normal Normal       Fundus Exam      Right Left   Posterior Vitreous Vitreal papillary traction Normal   Disc Collateralization on the nerve, no NVD Normal   C/D Ratio 0.0    Macula Geographic atrophy, Macular thickening, Cystoid macular edema, Microaneurysms Normal   Vessels Old central retinal vein occlusion, ischemic at present. Normal   Periphery Normal, good PRP Normal          IMAGING AND PROCEDURES  Imaging and Procedures for 02/26/20  OCT, Retina - OU - Both Eyes       Right Eye Quality was good. Scan locations included subfoveal.   Left Eye Quality was good. Scan locations included subfoveal.                 ASSESSMENT/PLAN:  No problem-specific Assessment & Plan notes found for this encounter.    No diagnosis found.  1.OD, longstanding recurrent cystoid macular edema on the basis of ischemic central retinal vein occlusion many years ago.  Besides been resistant to Avastin, steroid, and Lucentis.  The eye is only minimally sponsored to intravitreal Eylea.  They at 8-week examination of the perifoveal CME is stable.  Repeat Eylea OD today to maintain visual functioning.  WeeksThe nature of central retinal vein occlusion was discussed with the patient including the division of types into nonischemic ischemic. The potential sequelae of ischemic central retinal vein occlusion, including macular edema, neovascularization, rubeosis iridis, and neovascular glaucoma, were discussed, and the need for frequent follow-up.  2.  3.  Ophthalmic Meds Ordered this visit:  No orders of the defined types were placed in this encounter.      No follow-ups on file.  There are no Patient Instructions on file for this visit.   Explained the diagnoses, plan, and follow up with the patient and  they expressed understanding.  Patient expressed understanding of the importance of proper follow up care.     Clent Demark Lovinia Snare M.D. Diseases & Surgery of the Retina and Vitreous Retina & Diabetic [place] 02/26/20     Abbreviations: M myopia (nearsighted); A astigmatism; H hyperopia (farsighted); P presbyopia; Mrx spectacle prescription;  CTL contact lenses; OD right eye; OS left eye; OU both eyes  XT exotropia; ET esotropia; PEK punctate epithelial keratitis; PEE punctate epithelial erosions; DES dry eye syndrome; MGD meibomian gland dysfunction; ATs artificial tears; PFAT's preservative free artificial tears; [place] nuclear sclerotic cataract; PSC posterior subcapsular cataract; ERM epi-retinal membrane; PVD posterior vitreous detachment; RD retinal detachment; DM diabetes mellitus; DR diabetic retinopathy; NPDR non-proliferative diabetic retinopathy; PDR proliferative diabetic retinopathy; CSME clinically significant macular edema; DME diabetic macular edema; dbh dot blot hemorrhages; CWS cotton wool spot; POAG primary open angle glaucoma; C/D cup-to-disc ratio; HVF humphrey visual field; GVF goldmann visual field; OCT optical coherence tomography; IOP intraocular pressure; BRVO Branch retinal vein occlusion; CRVO central retinal vein occlusion; CRAO central retinal artery occlusion; BRAO branch retinal artery occlusion; RT retinal tear; SB scleral buckle; PPV pars plana vitrectomy; VH Vitreous hemorrhage; PRP panretinal laser photocoagulation; IVK intravitreal kenalog; VMT vitreomacular traction; MH Macular hole;  NVD neovascularization of the disc; NVE neovascularization elsewhere; AREDS age related eye disease study; ARMD age related macular degeneration; POAG primary open angle glaucoma; EBMD epithelial/anterior basement membrane dystrophy; ACIOL anterior chamber intraocular lens; IOL intraocular lens; PCIOL posterior chamber intraocular lens; Phaco/IOL phacoemulsification with intraocular lens placement; [place] photorefractive keratectomy; LASIK laser assisted in situ keratomileusis; HTN hypertension; DM  diabetes mellitus; COPD chronic obstructive pulmonary disease

## 2020-02-26 NOTE — Assessment & Plan Note (Signed)
OD, longstanding recurrent cystoid macular edema on the basis of ischemic central retinal vein occlusion many years ago.  Besides been resistant to Avastin, steroid, and Lucentis.  The eye is only minimally sponsored to intravitreal Eylea.  They at 8-week examination of the perifoveal CME is stable.  Repeat Eylea OD today to maintain visual functioning.  WeeksThe nature of central retinal vein occlusion was discussed with the patient including the division of types into nonischemic ischemic. The potential sequelae of ischemic central retinal vein occlusion, including macular edema, neovascularization, rubeosis iridis, and neovascular glaucoma, were discussed, and the need for frequent follow-up.  The nature of macular edema and central retinal vein occlusion was discussed. The following options were considered:  1.Observation for a period to look for spontaneous improvement, is no linger the primary therapy. One-third worsen, one-third stay unchanged, and one-third improves.  2. Anti-VEGF Therapy. ( Lucentis, Avastin or Eylea ) injected  in intravitreal fashion, initially monthly then tailored to clinical response.  3. Intravitreal steroid usage, Kenalog, or Ozurdex, usually a second line therapy or in combination with anti-Vegf therapy noted above.  4. Panretinal laser photocoagulation to cause regression of iris neovascularization, or treat retinal  non-perfusion.  5. Surgical Management may include vitrectomy with incisions of peripheral veins to trigger retino choroidal anastomosis formation. This topic presented and discussed at [facility].

## 2020-04-22 ENCOUNTER — Encounter (INDEPENDENT_AMBULATORY_CARE_PROVIDER_SITE_OTHER): Payer: Self-pay | Admitting: Ophthalmology

## 2020-04-22 ENCOUNTER — Ambulatory Visit (INDEPENDENT_AMBULATORY_CARE_PROVIDER_SITE_OTHER): Payer: Medicare HMO | Admitting: Ophthalmology

## 2020-04-22 ENCOUNTER — Other Ambulatory Visit: Payer: Self-pay

## 2020-04-22 DIAGNOSIS — H34811 Central retinal vein occlusion, right eye, with macular edema: Secondary | ICD-10-CM | POA: Diagnosis not present

## 2020-04-22 MED ORDER — AFLIBERCEPT 2MG/0.05ML IZ SOLN FOR KALEIDOSCOPE
2.0000 mg | INTRAVITREAL | Status: AC | PRN
  Administered 2020-04-22: 2 mg via INTRAVITREAL

## 2020-04-22 NOTE — Assessment & Plan Note (Signed)
The nature of central retinal vein occlusion was discussed with the patient including the division of types into nonischemic ischemic. The potential sequelae of ischemic central retinal vein occlusion, including macular edema, neovascularization, rubeosis iridis, and neovascular glaucoma, were discussed, and the need for frequent follow-up.  The nature of macular edema and central retinal vein occlusion was discussed. The following options were considered:  1.Observation for a period to look for spontaneous improvement, is no linger the primary therapy. One-third worsen, one-third stay unchanged, and one-third improves.  2. Anti-VEGF Therapy. ( Lucentis, Avastin or Eylea ) injected  in intravitreal fashion, initially monthly then tailored to clinical response.  3. Intravitreal steroid usage, Kenalog, or Ozurdex, usually a second line therapy or in combination with anti-Vegf therapy noted above.  4. Panretinal laser photocoagulation to cause regression of iris neovascularization, or treat retinal  non-perfusion.  5. Surgical Management may include vitrectomy with incisions of peripheral veins to trigger retino choroidal anastomosis formation. This topic presented and discussed at [facility].  OD continues to remain stable at 8-week interval, on intravitreal Eylea.  Now stable for nearly 1 year with very little perifoveal CME.  Pete injection OD today and consider extension to 10-week follow-up examination

## 2020-04-22 NOTE — Progress Notes (Signed)
04/22/2020     CHIEF COMPLAINT Patient presents for Retina Follow Up   HISTORY OF PRESENT ILLNESS: Brent Espinoza is a 69 y.o. male who presents to the clinic today for:   HPI    Retina Follow Up    Patient presents with  CRVO/BRVO.  In right eye.  Duration of 8 weeks.  Since onset it is stable.          Comments    8 week f.u - OCT OU, Poss Eylea OD Patient denies change in vision and overall has no complaints.        Last edited by Gerda Diss on 04/22/2020  8:47 AM. (History)      Referring physician: Carlena Hurl, PA-C [place],  [place] [address]  HISTORICAL INFORMATION:   Selected notes from the MEDICAL RECORD NUMBER    Lab Results  Component Value Date   HGBA1C 6.3 (H) 07/19/2019     CURRENT MEDICATIONS: No current outpatient medications on file. (Ophthalmic Drugs)   No current facility-administered medications for this visit. (Ophthalmic Drugs)   Current Outpatient Medications (Other)  Medication Sig  . aspirin EC 81 MG tablet Take 1 tablet (81 mg total) by mouth daily.  Marland Kitchen atorvastatin (LIPITOR) 40 MG tablet Take 1 tablet (40 mg total) by mouth daily.  . naproxen sodium (ALEVE) 220 MG tablet Take 220 mg by mouth.   Current Facility-Administered Medications (Other)  Medication Route  . 0.9 %  sodium chloride infusion Intravenous      REVIEW OF SYSTEMS:    ALLERGIES Allergies  Allergen Reactions  . Penicillins     As child     PAST MEDICAL HISTORY Past Medical History:  Diagnosis Date  . Abnormal CT of the chest 06/08/2018  . Allergy    mild  . Atherosclerosis of aorta ([place]) 06/08/2018  . Cataract    removed right eye  . Coronary artery disease involving native heart without angina pectoris 06/08/2018  . Erectile dysfunction 06/08/2018  . Eye disease 02/18/2017  . GERD (gastroesophageal reflux disease)    occ  . HDL deficiency 02/19/2017  . Hyperlipidemia   . Impacted cerumen of right ear 02/18/2017  .  Leukocytosis 02/19/2017  . Mixed dyslipidemia 02/19/2017  . Overweight 02/18/2017  . Smoking   . Tobacco use 02/18/2017   Past Surgical History:  Procedure Laterality Date  . EYE SURGERY     gets therapy injections for vessel disease, hx/o cataract surgery  . MOUTH SURGERY      FAMILY HISTORY Family History  Problem Relation Age of Onset  . Other Mother        eye disease, died of old age  . Other Father        health history unknown  . Cancer Neg Hx   . Diabetes Neg Hx   . Heart disease Neg Hx   . Stroke Neg Hx   . Hyperlipidemia Neg Hx   . Hypertension Neg Hx   . Colon cancer Neg Hx   . Colon polyps Neg Hx   . Rectal cancer Neg Hx   . Stomach cancer Neg Hx     SOCIAL HISTORY Social History   Tobacco Use  . Smoking status: Current Every Day Smoker    Packs/day: 1.00    Years: 50.00    Pack years: 50.00    Types: Cigarettes  . Smokeless tobacco: Never Used  Vaping Use  . Vaping Use: Never used  Substance Use  Topics  . Alcohol use: Yes    Comment: occ  . Drug use: No         OPHTHALMIC EXAM:  Base Eye Exam    Visual Acuity (Snellen - Linear)      Right Left   Dist cc CF @ 8' 20/25-1   Dist ph cc 20/400    Correction: Glasses       Tonometry (Tonopen, 8:51 AM)      Right Left   Pressure 19 18       Pupils      Pupils Dark Light Shape React APD   Right PERRL 4 3 Round Slow None   Left PERRL 4 3 Round Slow None       Visual Fields (Counting fingers)      Left Right    Full Full       Extraocular Movement      Right Left    Full Full       Neuro/Psych    Oriented x3: Yes   Mood/Affect: Normal       Dilation    Right eye: 1.0% Mydriacyl, 2.5% Phenylephrine @ 8:51 AM        Slit Lamp and Fundus Exam    External Exam      Right Left   External Normal Normal       Slit Lamp Exam      Right Left   Lids/Lashes Normal Normal   Conjunctiva/Sclera White and quiet White and quiet   Cornea Clear Clear   Anterior Chamber Deep and  quiet Deep and quiet   Iris Round and reactive Round and reactive   Lens Posterior chamber intraocular lens    Anterior Vitreous Normal Normal       Fundus Exam      Right Left   Posterior Vitreous Vitreal papillary traction    Disc Collateralization on the nerve, no NVD    C/D Ratio 0.0    Macula Geographic atrophy, Macular thickening, Cystoid macular edema, Microaneurysms    Vessels Old central retinal vein occlusion, ischemic at present.    Periphery Normal, good PRP           IMAGING AND PROCEDURES  Imaging and Procedures for 04/22/20  OCT, Retina - OU - Both Eyes       Right Eye Quality was good. Scan locations included subfoveal. Central Foveal Thickness: 232. Progression has been stable. Findings include no SRF, no IRF, outer retinal atrophy, epiretinal membrane.   Left Eye Quality was good. Scan locations included subfoveal. Central Foveal Thickness: 309. Progression has been stable. Findings include vitreomacular adhesion .   Notes OD with central foveal atrophy from prior CME, and very minimal to no perifoveal CME.  There are epiretinal membrane, no progression       Intravitreal Injection, Pharmacologic Agent - OD - Right Eye       Time Out 04/22/2020. 9:44 AM. Confirmed correct patient, procedure, site, and patient consented.   Anesthesia Topical anesthesia was used. Anesthetic medications included [place] 3.5%.   Procedure Preparation included 5% betadine to ocular surface, 10% betadine to eyelids, Tobramycin 0.3%. A 30 gauge needle was used.   Injection:  2 mg aflibercept Alfonse Flavors) SOLN   NDC: [id number], Lot: [id number]   Route: Intravitreal, Site: Right Eye, Waste: 0 mg  Post-op Post injection exam found visual acuity of at least counting fingers. The patient tolerated the procedure well. There were no complications. The patient received written and verbal post  procedure care education. Post injection medications were not given.                   ASSESSMENT/PLAN:  Central retinal vein occlusion with macular edema of right eye The nature of central retinal vein occlusion was discussed with the patient including the division of types into nonischemic ischemic. The potential sequelae of ischemic central retinal vein occlusion, including macular edema, neovascularization, rubeosis iridis, and neovascular glaucoma, were discussed, and the need for frequent follow-up.  The nature of macular edema and central retinal vein occlusion was discussed. The following options were considered:  1.Observation for a period to look for spontaneous improvement, is no linger the primary therapy. One-third worsen, one-third stay unchanged, and one-third improves.  2. Anti-VEGF Therapy. ( Lucentis, Avastin or Eylea ) injected  in intravitreal fashion, initially monthly then tailored to clinical response.  3. Intravitreal steroid usage, Kenalog, or Ozurdex, usually a second line therapy or in combination with anti-Vegf therapy noted above.  4. Panretinal laser photocoagulation to cause regression of iris neovascularization, or treat retinal  non-perfusion.  5. Surgical Management may include vitrectomy with incisions of peripheral veins to trigger retino choroidal anastomosis formation. This topic presented and discussed at [place] [facility].  OD continues to remain stable at 8-week interval, on intravitreal Eylea.  Now stable for nearly 1 year with very little perifoveal CME.  Pete injection OD today and consider extension to 10-week follow-up examination      ICD-10-CM   1. Central retinal vein occlusion with macular edema of right eye  H34.8110 OCT, Retina - OU - Both Eyes    Intravitreal Injection, Pharmacologic Agent - OD - Right Eye    aflibercept (EYLEA) SOLN 2 mg    1.  2.  3.  Ophthalmic Meds Ordered this visit:  Meds ordered this encounter  Medications  . aflibercept (EYLEA) SOLN 2 mg       Return in about 10 weeks (around 07/01/2020)  for dilate, OD, EYLEA OCT.  There are no Patient Instructions on file for this visit.   Explained the diagnoses, plan, and follow up with the patient and they expressed understanding.  Patient expressed understanding of the importance of proper follow up care.   Clent Demark Kateri Balch M.D. Diseases & Surgery of the Retina and Vitreous Retina & Diabetic [place] 04/22/20     Abbreviations: M myopia (nearsighted); A astigmatism; H hyperopia (farsighted); P presbyopia; Mrx spectacle prescription;  CTL contact lenses; OD right eye; OS left eye; OU both eyes  XT exotropia; ET esotropia; PEK punctate epithelial keratitis; PEE punctate epithelial erosions; DES dry eye syndrome; MGD meibomian gland dysfunction; ATs artificial tears; PFAT's preservative free artificial tears; [place] nuclear sclerotic cataract; PSC posterior subcapsular cataract; ERM epi-retinal membrane; PVD posterior vitreous detachment; RD retinal detachment; DM diabetes mellitus; DR diabetic retinopathy; NPDR non-proliferative diabetic retinopathy; PDR proliferative diabetic retinopathy; CSME clinically significant macular edema; DME diabetic macular edema; dbh dot blot hemorrhages; CWS cotton wool spot; POAG primary open angle glaucoma; C/D cup-to-disc ratio; HVF humphrey visual field; GVF goldmann visual field; OCT optical coherence tomography; IOP intraocular pressure; BRVO Branch retinal vein occlusion; CRVO central retinal vein occlusion; CRAO central retinal artery occlusion; BRAO branch retinal artery occlusion; RT retinal tear; SB scleral buckle; PPV pars plana vitrectomy; VH Vitreous hemorrhage; PRP panretinal laser photocoagulation; IVK intravitreal kenalog; VMT vitreomacular traction; MH Macular hole;  NVD neovascularization of the disc; NVE neovascularization elsewhere; AREDS age related eye disease study; ARMD age related macular degeneration;  POAG primary open angle glaucoma; EBMD epithelial/anterior basement membrane dystrophy; ACIOL  anterior chamber intraocular lens; IOL intraocular lens; PCIOL posterior chamber intraocular lens; Phaco/IOL phacoemulsification with intraocular lens placement; [place] photorefractive keratectomy; LASIK laser assisted in situ keratomileusis; HTN hypertension; DM diabetes mellitus; COPD chronic obstructive pulmonary disease

## 2020-04-29 ENCOUNTER — Other Ambulatory Visit: Payer: Self-pay | Admitting: Cardiovascular Disease

## 2020-04-29 DIAGNOSIS — E782 Mixed hyperlipidemia: Secondary | ICD-10-CM

## 2020-04-29 DIAGNOSIS — Z720 Tobacco use: Secondary | ICD-10-CM

## 2020-04-29 MED ORDER — ATORVASTATIN CALCIUM 40 MG PO TABS: 40.0000 mg | ORAL_TABLET | Freq: Every day | ORAL | 2 refills | Status: DC

## 2020-04-29 NOTE — Telephone Encounter (Signed)
Rx(s) sent to pharmacy electronically.  

## 2020-04-29 NOTE — Telephone Encounter (Signed)
*  STAT* If patient is at the pharmacy, call can be transferred to refill team.   1. Which medications need to be refilled? (please list name of each medication and dose if known)  atorvastatin (LIPITOR) 40 MG tablet  2. Which pharmacy/location (including street and city if local pharmacy) is medication to be sent to? [facility] ([place]), [place] - [address]  3. Do they need a 30 day or 90 day supply? 90 with refills   Pt just went to the pharmacy and said he does not have any refills left

## 2020-06-01 IMAGING — CR DG FINGER THUMB 2+V*L*
3 series · 3 of 3 positions shown · non-contrast
Comparison: None.

CLINICAL DATA: Left thumb pain and swelling for 2 months without
known injury.

EXAM:
LEFT THUMB 2+V

[x finger pa left]
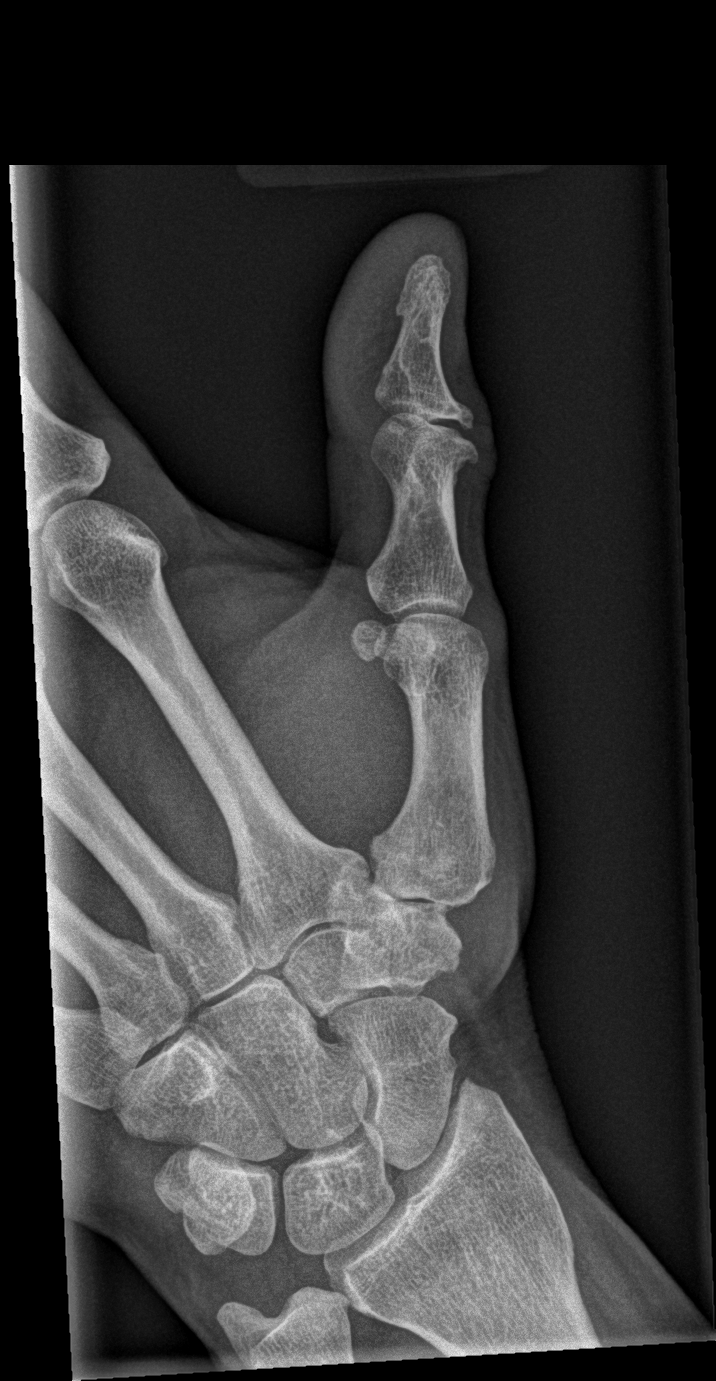

[x finger obl left]
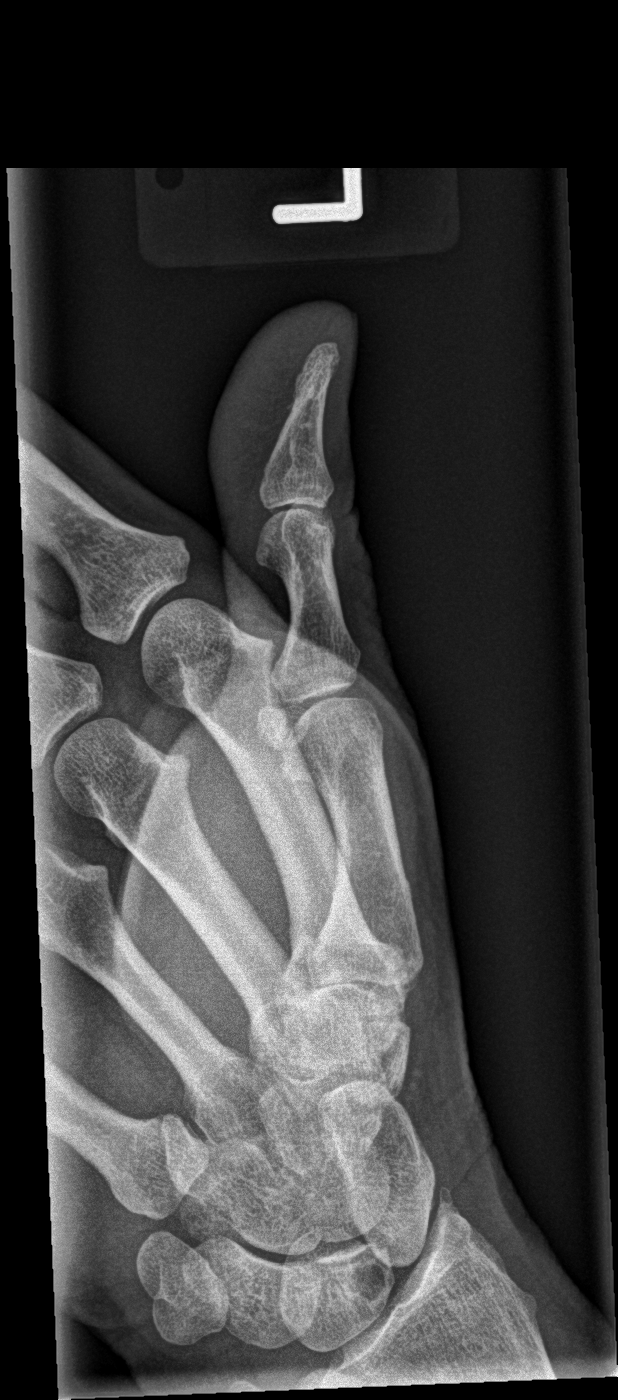

[x finger lat left]
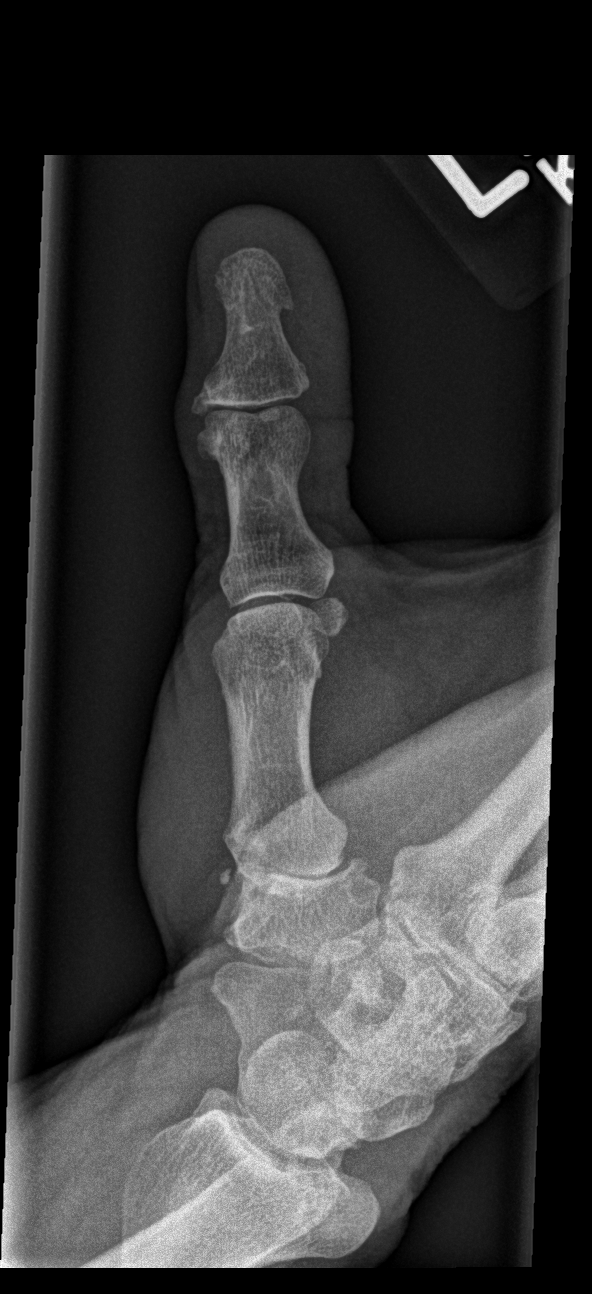

[3 of 3 positions shown; findings below may reference images not displayed]

FINDINGS: There is no evidence of fracture or dislocation. Mild narrowing of
the first carpometacarpal joint is noted. Soft tissues are
unremarkable.
IMPRESSION: Mild osteoarthritis of the first carpometacarpal joint. No acute
abnormality seen in the left thumb.

## 2020-07-01 ENCOUNTER — Encounter (INDEPENDENT_AMBULATORY_CARE_PROVIDER_SITE_OTHER): Payer: Self-pay | Admitting: Ophthalmology

## 2020-07-01 ENCOUNTER — Other Ambulatory Visit: Payer: Self-pay

## 2020-07-01 ENCOUNTER — Ambulatory Visit (INDEPENDENT_AMBULATORY_CARE_PROVIDER_SITE_OTHER): Payer: Medicare HMO | Admitting: Ophthalmology

## 2020-07-01 DIAGNOSIS — H34811 Central retinal vein occlusion, right eye, with macular edema: Secondary | ICD-10-CM

## 2020-07-01 MED ORDER — AFLIBERCEPT 2MG/0.05ML IZ SOLN FOR KALEIDOSCOPE
2.0000 mg | INTRAVITREAL | Status: AC | PRN
  Administered 2020-07-01: 2 mg via INTRAVITREAL

## 2020-07-01 NOTE — Assessment & Plan Note (Signed)
OD with outer retinal geographic RPE and choriocapillaris atrophy, and perifoveal CME from central retinal vein occlusion, chronic, today at 10-week interval.  Overall stable repeat injection Eylea OD today and examination in 10 weeks

## 2020-07-01 NOTE — Progress Notes (Signed)
07/01/2020     CHIEF COMPLAINT Patient presents for Retina Follow Up   HISTORY OF PRESENT ILLNESS: Brent Espinoza is a 69 y.o. male who presents to the clinic today for:   HPI    Retina Follow Up    Patient presents with  CRVO/BRVO.  In right eye.  Severity is moderate.  Duration of 10 weeks.  Since onset it is stable.  I, the attending physician,  performed the HPI with the patient and updated documentation appropriately.          Comments    10 Week CRVO f\u OD. Possible Eylea OD. OCT  Pt states vision is about the same. Denies any complaints.       Last edited by Tilda Franco on 07/01/2020  8:08 AM. (History)      Referring physician: Carlena Hurl, PA-C [place],  [place] [address]  HISTORICAL INFORMATION:   Selected notes from the MEDICAL RECORD NUMBER    Lab Results  Component Value Date   HGBA1C 6.3 (H) 07/19/2019     CURRENT MEDICATIONS: No current outpatient medications on file. (Ophthalmic Drugs)   No current facility-administered medications for this visit. (Ophthalmic Drugs)   Current Outpatient Medications (Other)  Medication Sig  . aspirin EC 81 MG tablet Take 1 tablet (81 mg total) by mouth daily.  Marland Kitchen atorvastatin (LIPITOR) 40 MG tablet Take 1 tablet (40 mg total) by mouth daily.  . naproxen sodium (ALEVE) 220 MG tablet Take 220 mg by mouth.   Current Facility-Administered Medications (Other)  Medication Route  . 0.9 %  sodium chloride infusion Intravenous      REVIEW OF SYSTEMS:    ALLERGIES Allergies  Allergen Reactions  . Penicillins     As child     PAST MEDICAL HISTORY Past Medical History:  Diagnosis Date  . Abnormal CT of the chest 06/08/2018  . Allergy    mild  . Atherosclerosis of aorta ([place]) 06/08/2018  . Cataract    removed right eye  . Coronary artery disease involving native heart without angina pectoris 06/08/2018  . Erectile dysfunction 06/08/2018  . Eye disease 02/18/2017  . GERD  (gastroesophageal reflux disease)    occ  . HDL deficiency 02/19/2017  . Hyperlipidemia   . Impacted cerumen of right ear 02/18/2017  . Leukocytosis 02/19/2017  . Mixed dyslipidemia 02/19/2017  . Overweight 02/18/2017  . Smoking   . Tobacco use 02/18/2017   Past Surgical History:  Procedure Laterality Date  . EYE SURGERY     gets therapy injections for vessel disease, hx/o cataract surgery  . MOUTH SURGERY      FAMILY HISTORY Family History  Problem Relation Age of Onset  . Other Mother        eye disease, died of old age  . Other Father        health history unknown  . Cancer Neg Hx   . Diabetes Neg Hx   . Heart disease Neg Hx   . Stroke Neg Hx   . Hyperlipidemia Neg Hx   . Hypertension Neg Hx   . Colon cancer Neg Hx   . Colon polyps Neg Hx   . Rectal cancer Neg Hx   . Stomach cancer Neg Hx     SOCIAL HISTORY Social History   Tobacco Use  . Smoking status: Current Every Day Smoker    Packs/day: 1.00    Years: 50.00    Pack years: 50.00  Types: Cigarettes  . Smokeless tobacco: Never Used  Vaping Use  . Vaping Use: Never used  Substance Use Topics  . Alcohol use: Yes    Comment: occ  . Drug use: No         OPHTHALMIC EXAM: Base Eye Exam    Visual Acuity (Snellen - Linear)      Right Left   Dist cc E Card @ 8' 20/20   Dist ph cc 20/400    Correction: Glasses       Tonometry (Tonopen, 8:13 AM)      Right Left   Pressure 20 20       Pupils      Pupils Dark Light Shape React APD   Right PERRL 4 3 Round Brisk None   Left PERRL 4 3 Round Brisk None       Visual Fields (Counting fingers)      Left Right    Full Full       Neuro/Psych    Oriented x3: Yes   Mood/Affect: Normal       Dilation    Right eye: 1.0% Mydriacyl, 2.5% Phenylephrine @ 8:13 AM        Slit Lamp and Fundus Exam    External Exam      Right Left   External Normal Normal       Slit Lamp Exam      Right Left   Lids/Lashes Normal Normal   Conjunctiva/Sclera White  and quiet White and quiet   Cornea Clear Clear   Anterior Chamber Deep and quiet Deep and quiet   Iris Round and reactive Round and reactive   Lens Posterior chamber intraocular lens    Anterior Vitreous Normal Normal       Fundus Exam      Right Left   Posterior Vitreous Vitreal papillary traction    Disc Collateralization on the nerve, no NVD    C/D Ratio 0.0    Macula Geographic atrophy, Macular thickening, Cystoid macular edema, Microaneurysms    Vessels Old central retinal vein occlusion, ischemic at present.    Periphery Normal, good PRP           IMAGING AND PROCEDURES  Imaging and Procedures for 07/01/20  OCT, Retina - OU - Both Eyes       Right Eye Quality was borderline. Scan locations included subfoveal. Central Foveal Thickness: 285. Progression has worsened. Findings include cystoid macular edema.   Left Eye Quality was good. Central Foveal Thickness: 301. Progression has been stable. Findings include normal foveal contour.   Notes OD with outer retinal geographic RPE and choriocapillaris atrophy, and perifoveal CME from central retinal vein occlusion, chronic, today at 10-week interval.  Overall stable repeat injection Eylea OD today and examination in 10 weeks       Intravitreal Injection, Pharmacologic Agent - OD - Right Eye       Time Out 07/01/2020. 8:48 AM. Confirmed correct patient, procedure, site, and patient consented.   Anesthesia Topical anesthesia was used. Anesthetic medications included Akten 3.5%.   Procedure Preparation included 5% betadine to ocular surface, 10% betadine to eyelids, Tobramycin 0.3%. A 30 gauge needle was used.   Injection:  2 mg aflibercept Alfonse Flavors) SOLN   NDC: [id number], Lot: [id number]   Route: Intravitreal, Site: Right Eye, Waste: 0 mg  Post-op Post injection exam found visual acuity of at least counting fingers. The patient tolerated the procedure well. There were no complications. The patient received  written  and verbal post procedure care education. Post injection medications were not given.                 ASSESSMENT/PLAN:  Central retinal vein occlusion with macular edema of right eye OD with outer retinal geographic RPE and choriocapillaris atrophy, and perifoveal CME from central retinal vein occlusion, chronic, today at 10-week interval.  Overall stable repeat injection Eylea OD today and examination in 10 weeks      ICD-10-CM   1. Central retinal vein occlusion with macular edema of right eye  H34.8110 OCT, Retina - OU - Both Eyes    Intravitreal Injection, Pharmacologic Agent - OD - Right Eye    aflibercept (EYLEA) SOLN 2 mg    1.  OD with chronic CRV O with CME.  Visual acuity limited by outer retinal geographic atrophy in the fovea.  Less perifoveal CME controlled now at 10-week interval of Eylea.  According to the patient treatment burden is appropriate and manageable.  2.  Repeat injection intravitreal Eylea OD today and examination in 10 weeks  3.  Ophthalmic Meds Ordered this visit:  Meds ordered this encounter  Medications  . aflibercept (EYLEA) SOLN 2 mg       Return in about 10 weeks (around 09/09/2020) for dilate, OD, EYLEA OCT.  There are no Patient Instructions on file for this visit.   Explained the diagnoses, plan, and follow up with the patient and they expressed understanding.  Patient expressed understanding of the importance of proper follow up care.   Clent Demark Lennard Capek M.D. Diseases & Surgery of the Retina and Vitreous Retina & Diabetic [place] 07/01/20     Abbreviations: M myopia (nearsighted); A astigmatism; H hyperopia (farsighted); P presbyopia; Mrx spectacle prescription;  CTL contact lenses; OD right eye; OS left eye; OU both eyes  XT exotropia; ET esotropia; PEK punctate epithelial keratitis; PEE punctate epithelial erosions; DES dry eye syndrome; MGD meibomian gland dysfunction; ATs artificial tears; PFAT's preservative free  artificial tears; [place] nuclear sclerotic cataract; PSC posterior subcapsular cataract; ERM epi-retinal membrane; PVD posterior vitreous detachment; RD retinal detachment; DM diabetes mellitus; DR diabetic retinopathy; NPDR non-proliferative diabetic retinopathy; PDR proliferative diabetic retinopathy; CSME clinically significant macular edema; DME diabetic macular edema; dbh dot blot hemorrhages; CWS cotton wool spot; POAG primary open angle glaucoma; C/D cup-to-disc ratio; HVF humphrey visual field; GVF goldmann visual field; OCT optical coherence tomography; IOP intraocular pressure; BRVO Branch retinal vein occlusion; CRVO central retinal vein occlusion; CRAO central retinal artery occlusion; BRAO branch retinal artery occlusion; RT retinal tear; SB scleral buckle; PPV pars plana vitrectomy; VH Vitreous hemorrhage; PRP panretinal laser photocoagulation; IVK intravitreal kenalog; VMT vitreomacular traction; MH Macular hole;  NVD neovascularization of the disc; NVE neovascularization elsewhere; AREDS age related eye disease study; ARMD age related macular degeneration; POAG primary open angle glaucoma; EBMD epithelial/anterior basement membrane dystrophy; ACIOL anterior chamber intraocular lens; IOL intraocular lens; PCIOL posterior chamber intraocular lens; Phaco/IOL phacoemulsification with intraocular lens placement; [place] photorefractive keratectomy; LASIK laser assisted in situ keratomileusis; HTN hypertension; DM diabetes mellitus; COPD chronic obstructive pulmonary disease

## 2020-08-01 ENCOUNTER — Other Ambulatory Visit: Payer: Self-pay | Admitting: Cardiovascular Disease

## 2020-08-01 DIAGNOSIS — Z720 Tobacco use: Secondary | ICD-10-CM

## 2020-08-01 DIAGNOSIS — E782 Mixed hyperlipidemia: Secondary | ICD-10-CM

## 2020-09-09 ENCOUNTER — Other Ambulatory Visit: Payer: Self-pay

## 2020-09-09 ENCOUNTER — Encounter (INDEPENDENT_AMBULATORY_CARE_PROVIDER_SITE_OTHER): Payer: Self-pay | Admitting: Ophthalmology

## 2020-09-09 ENCOUNTER — Ambulatory Visit (INDEPENDENT_AMBULATORY_CARE_PROVIDER_SITE_OTHER): Payer: Medicare HMO | Admitting: Ophthalmology

## 2020-09-09 DIAGNOSIS — H34811 Central retinal vein occlusion, right eye, with macular edema: Secondary | ICD-10-CM

## 2020-09-09 DIAGNOSIS — H43822 Vitreomacular adhesion, left eye: Secondary | ICD-10-CM

## 2020-09-09 MED ORDER — AFLIBERCEPT 2MG/0.05ML IZ SOLN FOR KALEIDOSCOPE
2.0000 mg | INTRAVITREAL | Status: AC | PRN
  Administered 2020-09-09: 2 mg via INTRAVITREAL

## 2020-09-09 NOTE — Assessment & Plan Note (Signed)
No change OS, stable will observe

## 2020-09-09 NOTE — Patient Instructions (Signed)
Patient instructed to contact the office promptly for new changes in acuity or distortions

## 2020-09-09 NOTE — Progress Notes (Signed)
09/09/2020     CHIEF COMPLAINT Patient presents for Retina Follow Up   HISTORY OF PRESENT ILLNESS: Brent Espinoza is a 69 y.o. male who presents to the clinic today for:   HPI    Retina Follow Up    Patient presents with  CRVO/BRVO.  In right eye.  This started 10 weeks ago.  Severity is mild.  Duration of 10 weeks.  Since onset it is stable.          Comments    10 Week F/U OU, poss Eylea OD  Pt denies noticeable changes to [place] OU since last visit. Pt denies ocular pain, flashes of light, or floaters OU.         Last edited by Rockie Neighbours, [place] on 09/09/2020  8:07 AM. (History)      Referring physician: Carlena Hurl, PA-C [place],  [place] [address]  HISTORICAL INFORMATION:   Selected notes from the MEDICAL RECORD NUMBER    Lab Results  Component Value Date   HGBA1C 6.3 (H) 07/19/2019     CURRENT MEDICATIONS: No current outpatient medications on file. (Ophthalmic Drugs)   No current facility-administered medications for this visit. (Ophthalmic Drugs)   Current Outpatient Medications (Other)  Medication Sig  . aspirin EC 81 MG tablet Take 1 tablet (81 mg total) by mouth daily.  Marland Kitchen atorvastatin (LIPITOR) 40 MG tablet Take 1 tablet by mouth once daily  . naproxen sodium (ALEVE) 220 MG tablet Take 220 mg by mouth.   Current Facility-Administered Medications (Other)  Medication Route  . 0.9 %  sodium chloride infusion Intravenous      REVIEW OF SYSTEMS:    ALLERGIES Allergies  Allergen Reactions  . Penicillins     As child     PAST MEDICAL HISTORY Past Medical History:  Diagnosis Date  . Abnormal CT of the chest 06/08/2018  . Allergy    mild  . Atherosclerosis of aorta ([place]) 06/08/2018  . Cataract    removed right eye  . Coronary artery disease involving native heart without angina pectoris 06/08/2018  . Erectile dysfunction 06/08/2018  . Eye disease 02/18/2017  . GERD (gastroesophageal reflux disease)    occ  . HDL  deficiency 02/19/2017  . Hyperlipidemia   . Impacted cerumen of right ear 02/18/2017  . Leukocytosis 02/19/2017  . Mixed dyslipidemia 02/19/2017  . Overweight 02/18/2017  . Smoking   . Tobacco use 02/18/2017   Past Surgical History:  Procedure Laterality Date  . EYE SURGERY     gets therapy injections for vessel disease, hx/o cataract surgery  . MOUTH SURGERY      FAMILY HISTORY Family History  Problem Relation Age of Onset  . Other Mother        eye disease, died of old age  . Other Father        health history unknown  . Cancer Neg Hx   . Diabetes Neg Hx   . Heart disease Neg Hx   . Stroke Neg Hx   . Hyperlipidemia Neg Hx   . Hypertension Neg Hx   . Colon cancer Neg Hx   . Colon polyps Neg Hx   . Rectal cancer Neg Hx   . Stomach cancer Neg Hx     SOCIAL HISTORY Social History   Tobacco Use  . Smoking status: Current Every Day Smoker    Packs/day: 1.00    Years: 50.00    Pack years: 50.00    Types:  Cigarettes  . Smokeless tobacco: Never Used  Vaping Use  . Vaping Use: Never used  Substance Use Topics  . Alcohol use: Yes    Comment: occ  . Drug use: No         OPHTHALMIC EXAM:  Base Eye Exam    Visual Acuity (ETDRS)      Right Left   Dist cc 20/200 20/20 -1   Dist ph cc 20/100 -2    Correction: Glasses       Tonometry (Tonopen, 8:07 AM)      Right Left   Pressure 17 12       Pupils      Pupils Dark Light Shape React APD   Right PERRL 6 5 Round Brisk None   Left PERRL 6 5 Round Brisk None       Visual Fields (Counting fingers)      Left Right    Full Full       Extraocular Movement      Right Left    Full Full       Neuro/Psych    Oriented x3: Yes   Mood/Affect: Normal       Dilation    Both eyes: 1.0% Mydriacyl, 2.5% Phenylephrine @ 8:09 AM        Slit Lamp and Fundus Exam    External Exam      Right Left   External Normal Normal       Slit Lamp Exam      Right Left   Lids/Lashes Normal Normal   Conjunctiva/Sclera  White and quiet White and quiet   Cornea Clear Clear   Anterior Chamber Deep and quiet Deep and quiet   Iris Round and reactive Round and reactive   Lens Posterior chamber intraocular lens    Anterior Vitreous Normal Normal       Fundus Exam      Right Left   Posterior Vitreous Vitreal papillary traction Normal   Disc Collateralization on the nerve, no NVD Normal   C/D Ratio 0.0 0.0   Macula Geographic atrophy, Macular thickening, Cystoid macular edema, Microaneurysms, Epiretinal membrane Normal   Vessels Old central retinal vein occlusion, ischemic at present. Normal   Periphery Normal, good PRP Normal, 25 and 20 diopter lens, 90 diopter lens          IMAGING AND PROCEDURES  Imaging and Procedures for 09/09/20  OCT, Retina - OU - Both Eyes       Right Eye Quality was good. Scan locations included subfoveal. Central Foveal Thickness: 256. Progression has improved. Findings include epiretinal membrane, cystoid macular edema.   Left Eye Quality was good. Scan locations included subfoveal. Central Foveal Thickness: 309. Progression has been stable. Findings include normal observations.   Notes OD with chronic CME and yet central foveal atrophy from outer retinal atrophy with RPE degeneration From chronic cystoid macular edema of CRV O       Intravitreal Injection, Pharmacologic Agent - OD - Right Eye       Time Out 09/09/2020. 8:45 AM. Confirmed correct patient, procedure, site, and patient consented.   Anesthesia Topical anesthesia was used. Anesthetic medications included Akten 3.5%.   Procedure Preparation included Ofloxacin . A 30 gauge needle was used.   Injection:  2 mg aflibercept Alfonse Flavors) SOLN   NDC: [id number], Lot: [id number]   Route: Intravitreal, Site: Right Eye, Waste: 0 mg  Post-op Post injection exam found visual acuity of at least counting fingers. The patient tolerated  the procedure well. There were no complications. The patient received  written and verbal post procedure care education. Post injection medications were not given.                 ASSESSMENT/PLAN:  Vitreomacular adhesion of left eye No change OS, stable will observe  Central retinal vein occlusion with macular edema of right eye Improved now and stable at 10-week interval, will repeat injection today and again in 10 weeks with consideration for going to 3 months in the near future      ICD-10-CM   1. Central retinal vein occlusion with macular edema of right eye  H34.8110 OCT, Retina - OU - Both Eyes    Intravitreal Injection, Pharmacologic Agent - OD - Right Eye    aflibercept (EYLEA) SOLN 2 mg  2. Vitreomacular adhesion of left eye  H43.822     1.  Repeat injection intravitreal Eylea OD today and examination again in 10 weeks  2.  Dilate OD next,  if treated next visit, may consider extension of interval to 3 months  3.  Ophthalmic Meds Ordered this visit:  Meds ordered this encounter  Medications  . aflibercept (EYLEA) SOLN 2 mg       Return in about 10 weeks (around 11/18/2020) for dilate, OD, EYLEA OCT.  Patient Instructions  Patient instructed to contact the office promptly for new changes in acuity or distortions     Explained the diagnoses, plan, and follow up with the patient and they expressed understanding.  Patient expressed understanding of the importance of proper follow up care.   Clent Demark Jatavious Peppard M.D. Diseases & Surgery of the Retina and Vitreous Retina & Diabetic [place] 09/09/20     Abbreviations: M myopia (nearsighted); A astigmatism; H hyperopia (farsighted); P presbyopia; Mrx spectacle prescription;  CTL contact lenses; OD right eye; OS left eye; OU both eyes  XT exotropia; ET esotropia; PEK punctate epithelial keratitis; PEE punctate epithelial erosions; DES dry eye syndrome; MGD meibomian gland dysfunction; ATs artificial tears; PFAT's preservative free artificial tears; [place] nuclear sclerotic cataract; PSC  posterior subcapsular cataract; ERM epi-retinal membrane; PVD posterior vitreous detachment; RD retinal detachment; DM diabetes mellitus; DR diabetic retinopathy; NPDR non-proliferative diabetic retinopathy; PDR proliferative diabetic retinopathy; CSME clinically significant macular edema; DME diabetic macular edema; dbh dot blot hemorrhages; CWS cotton wool spot; POAG primary open angle glaucoma; C/D cup-to-disc ratio; HVF humphrey visual field; GVF goldmann visual field; OCT optical coherence tomography; IOP intraocular pressure; BRVO Branch retinal vein occlusion; CRVO central retinal vein occlusion; CRAO central retinal artery occlusion; BRAO branch retinal artery occlusion; RT retinal tear; SB scleral buckle; PPV pars plana vitrectomy; VH Vitreous hemorrhage; PRP panretinal laser photocoagulation; IVK intravitreal kenalog; VMT vitreomacular traction; MH Macular hole;  NVD neovascularization of the disc; NVE neovascularization elsewhere; AREDS age related eye disease study; ARMD age related macular degeneration; POAG primary open angle glaucoma; EBMD epithelial/anterior basement membrane dystrophy; ACIOL anterior chamber intraocular lens; IOL intraocular lens; PCIOL posterior chamber intraocular lens; Phaco/IOL phacoemulsification with intraocular lens placement; [place] photorefractive keratectomy; LASIK laser assisted in situ keratomileusis; HTN hypertension; DM diabetes mellitus; COPD chronic obstructive pulmonary disease

## 2020-09-09 NOTE — Assessment & Plan Note (Signed)
Improved now and stable at 10-week interval, will repeat injection today and again in 10 weeks with consideration for going to 3 months in the near future

## 2020-10-12 DIAGNOSIS — C801 Malignant (primary) neoplasm, unspecified: Secondary | ICD-10-CM

## 2020-10-12 HISTORY — DX: Malignant (primary) neoplasm, unspecified: C80.1

## 2020-11-12 DIAGNOSIS — D225 Melanocytic nevi of trunk: Secondary | ICD-10-CM | POA: Diagnosis not present

## 2020-11-12 DIAGNOSIS — X32XXXA Exposure to sunlight, initial encounter: Secondary | ICD-10-CM | POA: Diagnosis not present

## 2020-11-12 DIAGNOSIS — C44311 Basal cell carcinoma of skin of nose: Secondary | ICD-10-CM | POA: Diagnosis not present

## 2020-11-12 DIAGNOSIS — Z1283 Encounter for screening for malignant neoplasm of skin: Secondary | ICD-10-CM | POA: Diagnosis not present

## 2020-11-12 DIAGNOSIS — L570 Actinic keratosis: Secondary | ICD-10-CM | POA: Diagnosis not present

## 2020-11-19 ENCOUNTER — Encounter (INDEPENDENT_AMBULATORY_CARE_PROVIDER_SITE_OTHER): Payer: Medicare HMO | Admitting: Ophthalmology

## 2020-11-19 ENCOUNTER — Encounter (INDEPENDENT_AMBULATORY_CARE_PROVIDER_SITE_OTHER): Payer: Self-pay | Admitting: Ophthalmology

## 2020-11-19 ENCOUNTER — Ambulatory Visit (INDEPENDENT_AMBULATORY_CARE_PROVIDER_SITE_OTHER): Payer: Medicare HMO | Admitting: Ophthalmology

## 2020-11-19 ENCOUNTER — Other Ambulatory Visit: Payer: Self-pay

## 2020-11-19 DIAGNOSIS — H35371 Puckering of macula, right eye: Secondary | ICD-10-CM

## 2020-11-19 DIAGNOSIS — H34811 Central retinal vein occlusion, right eye, with macular edema: Secondary | ICD-10-CM | POA: Diagnosis not present

## 2020-11-19 DIAGNOSIS — H43822 Vitreomacular adhesion, left eye: Secondary | ICD-10-CM

## 2020-11-19 MED ORDER — AFLIBERCEPT 2MG/0.05ML IZ SOLN FOR KALEIDOSCOPE
2.0000 mg | INTRAVITREAL | Status: AC | PRN
  Administered 2020-11-19: 2 mg via INTRAVITREAL

## 2020-11-19 NOTE — Assessment & Plan Note (Signed)
The nature of macular pucker (epiretinal membrane ERM) was discussed with the patient as well as threshold criteria for vitrectomy surgery. I explained that in rare cases another surgery is needed to actually remove a second wrinkle should it regrow.  Most often, the epiretinal membrane and underlying wrinkled internal limiting membrane are removed with the first surgery, to accomplish the goals.   If the operative eye is [place] (natural lens still present), cataract surgery is often recommended prior to Vitrectomy. This will enable the retina surgeon to have the best view during surgery and the patient to obtain optimal results in the future. Treatment options were discussed.  OD was epiretinal membrane, not pathologic with impact on vision

## 2020-11-19 NOTE — Assessment & Plan Note (Signed)
Present but not pathologic

## 2020-11-19 NOTE — Progress Notes (Signed)
11/19/2020     CHIEF COMPLAINT Patient presents for Retina Follow Up (10 WK FU OD, EYLEA OD///Pt reports vision improved OD. Pt denies any new F/F, pain, or pressure OD. )   HISTORY OF PRESENT ILLNESS: Brent Espinoza is a 70 y.o. male who presents to the clinic today for:   HPI    Retina Follow Up    Patient presents with  CRVO/BRVO.  In right eye.  This started 10 weeks ago.  Duration of 10 weeks.  Since onset it is gradually improving. Additional comments: 10 WK FU OD, EYLEA OD   Pt reports vision improved OD. Pt denies any new F/F, pain, or pressure OD.        Last edited by Nichola Sizer D on 11/19/2020  8:28 AM. (History)      Referring physician: Carlena Hurl, PA-C [place],  [place] [address]  HISTORICAL INFORMATION:   Selected notes from the MEDICAL RECORD NUMBER    Lab Results  Component Value Date   HGBA1C 6.3 (H) 07/19/2019     CURRENT MEDICATIONS: No current outpatient medications on file. (Ophthalmic Drugs)   No current facility-administered medications for this visit. (Ophthalmic Drugs)   Current Outpatient Medications (Other)  Medication Sig  . aspirin EC 81 MG tablet Take 1 tablet (81 mg total) by mouth daily.  Marland Kitchen atorvastatin (LIPITOR) 40 MG tablet Take 1 tablet by mouth once daily  . naproxen sodium (ALEVE) 220 MG tablet Take 220 mg by mouth.   Current Facility-Administered Medications (Other)  Medication Route  . 0.9 %  sodium chloride infusion Intravenous      REVIEW OF SYSTEMS:    ALLERGIES Allergies  Allergen Reactions  . Penicillins     As child     PAST MEDICAL HISTORY Past Medical History:  Diagnosis Date  . Abnormal CT of the chest 06/08/2018  . Allergy    mild  . Atherosclerosis of aorta ([place]) 06/08/2018  . Cataract    removed right eye  . Coronary artery disease involving native heart without angina pectoris 06/08/2018  . Erectile dysfunction 06/08/2018  . Eye disease 02/18/2017  . GERD  (gastroesophageal reflux disease)    occ  . HDL deficiency 02/19/2017  . Hyperlipidemia   . Impacted cerumen of right ear 02/18/2017  . Leukocytosis 02/19/2017  . Mixed dyslipidemia 02/19/2017  . Overweight 02/18/2017  . Smoking   . Tobacco use 02/18/2017   Past Surgical History:  Procedure Laterality Date  . EYE SURGERY     gets therapy injections for vessel disease, hx/o cataract surgery  . MOUTH SURGERY      FAMILY HISTORY Family History  Problem Relation Age of Onset  . Other Mother        eye disease, died of old age  . Other Father        health history unknown  . Cancer Neg Hx   . Diabetes Neg Hx   . Heart disease Neg Hx   . Stroke Neg Hx   . Hyperlipidemia Neg Hx   . Hypertension Neg Hx   . Colon cancer Neg Hx   . Colon polyps Neg Hx   . Rectal cancer Neg Hx   . Stomach cancer Neg Hx     SOCIAL HISTORY Social History   Tobacco Use  . Smoking status: Current Every Day Smoker    Packs/day: 1.00    Years: 50.00    Pack years: 50.00    Types: Cigarettes  .  Smokeless tobacco: Never Used  Vaping Use  . Vaping Use: Never used  Substance Use Topics  . Alcohol use: Yes    Comment: occ  . Drug use: No         OPHTHALMIC EXAM: Base Eye Exam    Visual Acuity (ETDRS)      Right Left   Dist cc 20/400 20/20 -1   Dist ph cc 20/100 -2    Correction: Glasses       Tonometry (Tonopen, 8:35 AM)      Right Left   Pressure 19 20       Pupils      Pupils Dark Light Shape React APD   Right PERRL 6 5 Round Brisk None   Left PERRL 6 5 Round Brisk None       Visual Fields (Counting fingers)      Left Right    Full Full       Extraocular Movement      Right Left    Full Full       Neuro/Psych    Oriented x3: Yes   Mood/Affect: Normal       Dilation    Right eye: 1.0% Mydriacyl, 2.5% Phenylephrine @ 8:35 AM        Slit Lamp and Fundus Exam    External Exam      Right Left   External Normal Normal       Slit Lamp Exam      Right Left    Lids/Lashes Normal Normal   Conjunctiva/Sclera White and quiet White and quiet   Cornea Clear Clear   Anterior Chamber Deep and quiet Deep and quiet   Iris Round and reactive Round and reactive   Lens Posterior chamber intraocular lens    Anterior Vitreous Normal Normal       Fundus Exam      Right Left   Posterior Vitreous Vitreal papillary traction    Disc Collateralization on the nerve, no NVD    C/D Ratio 0.0    Macula Geographic atrophy, Macular thickening, Cystoid macular edema, Microaneurysms, Epiretinal membrane    Vessels Old central retinal vein occlusion, ischemic at present.    Periphery Normal, good PRP 360           IMAGING AND PROCEDURES  Imaging and Procedures for 11/19/20  OCT, Retina - OU - Both Eyes       Right Eye Quality was good. Scan locations included subfoveal. Central Foveal Thickness: 299. Findings include abnormal foveal contour, cystoid macular edema, outer retinal atrophy, central retinal atrophy.   Left Eye Quality was good. Scan locations included subfoveal. Central Foveal Thickness: 307. Progression has been stable. Findings include normal foveal contour.   Notes OD persistent foveal atrophy from longstanding chronic recurrent CME from CRV O.  Nonetheless a 10-week follow-up interval slightly increased amount of CME  Concomitant with a decline in acuity.       Intravitreal Injection, Pharmacologic Agent - OD - Right Eye       Time Out 11/19/2020. 9:17 AM. Confirmed correct patient, procedure, site, and patient consented.   Anesthesia Topical anesthesia was used. Anesthetic medications included Akten 3.5%.   Procedure Preparation included Ofloxacin . A 30 gauge needle was used.   Injection:  2 mg aflibercept Alfonse Flavors) SOLN   NDC: [id number], Lot: [id number]   Route: Intravitreal, Site: Right Eye, Waste: 0 mg  Post-op Post injection exam found visual acuity of at least counting fingers. The patient tolerated  the procedure well.  There were no complications. The patient received written and verbal post procedure care education. Post injection medications were not given.                 ASSESSMENT/PLAN:  Central retinal vein occlusion with macular edema of right eye Chronic peripheral ischemia from since retinal vein occlusion, controlled in the past with PRP, recurrent CME only stabilized on intravitreal Eylea.  Resistant to Avastin, Lucentis in the past  Vitreomacular adhesion of left eye Present but not pathologic  Macular pucker, right eye The nature of macular pucker (epiretinal membrane ERM) was discussed with the patient as well as threshold criteria for vitrectomy surgery. I explained that in rare cases another surgery is needed to actually remove a second wrinkle should it regrow.  Most often, the epiretinal membrane and underlying wrinkled internal limiting membrane are removed with the first surgery, to accomplish the goals.   If the operative eye is [place] (natural lens still present), cataract surgery is often recommended prior to Vitrectomy. This will enable the retina surgeon to have the best view during surgery and the patient to obtain optimal results in the future. Treatment options were discussed.  OD was epiretinal membrane, not pathologic with impact on vision      ICD-10-CM   1. Central retinal vein occlusion with macular edema of right eye  H34.8110 OCT, Retina - OU - Both Eyes    Intravitreal Injection, Pharmacologic Agent - OD - Right Eye    aflibercept (EYLEA) SOLN 2 mg  2. Vitreomacular adhesion of left eye  H43.822   3. Macular pucker, right eye  H35.371     1.  Slight increase in foveal perifoveal CME at 10-week interval post Eylea.  Some accompanying decrease in vision in the right eye with this CME.  Repeat injection today Eylea and shorten exam interval to 9 weeks  2.  3.  Ophthalmic Meds Ordered this visit:  Meds ordered this encounter  Medications  . aflibercept  (EYLEA) SOLN 2 mg       Return in about 9 weeks (around 01/21/2021) for dilate, OD, EYLEA OCT.  There are no Patient Instructions on file for this visit.   Explained the diagnoses, plan, and follow up with the patient and they expressed understanding.  Patient expressed understanding of the importance of proper follow up care.   Clent Demark Rosa Wyly M.D. Diseases & Surgery of the Retina and Vitreous Retina & Diabetic [place] 11/19/20     Abbreviations: M myopia (nearsighted); A astigmatism; H hyperopia (farsighted); P presbyopia; Mrx spectacle prescription;  CTL contact lenses; OD right eye; OS left eye; OU both eyes  XT exotropia; ET esotropia; PEK punctate epithelial keratitis; PEE punctate epithelial erosions; DES dry eye syndrome; MGD meibomian gland dysfunction; ATs artificial tears; PFAT's preservative free artificial tears; [place] nuclear sclerotic cataract; PSC posterior subcapsular cataract; ERM epi-retinal membrane; PVD posterior vitreous detachment; RD retinal detachment; DM diabetes mellitus; DR diabetic retinopathy; NPDR non-proliferative diabetic retinopathy; PDR proliferative diabetic retinopathy; CSME clinically significant macular edema; DME diabetic macular edema; dbh dot blot hemorrhages; CWS cotton wool spot; POAG primary open angle glaucoma; C/D cup-to-disc ratio; HVF humphrey visual field; GVF goldmann visual field; OCT optical coherence tomography; IOP intraocular pressure; BRVO Branch retinal vein occlusion; CRVO central retinal vein occlusion; CRAO central retinal artery occlusion; BRAO branch retinal artery occlusion; RT retinal tear; SB scleral buckle; PPV pars plana vitrectomy; VH Vitreous hemorrhage; PRP panretinal laser photocoagulation; IVK intravitreal kenalog; VMT vitreomacular  traction; MH Macular hole;  NVD neovascularization of the disc; NVE neovascularization elsewhere; AREDS age related eye disease study; ARMD age related macular degeneration; POAG primary open  angle glaucoma; EBMD epithelial/anterior basement membrane dystrophy; ACIOL anterior chamber intraocular lens; IOL intraocular lens; PCIOL posterior chamber intraocular lens; Phaco/IOL phacoemulsification with intraocular lens placement; [place] photorefractive keratectomy; LASIK laser assisted in situ keratomileusis; HTN hypertension; DM diabetes mellitus; COPD chronic obstructive pulmonary disease

## 2020-11-19 NOTE — Assessment & Plan Note (Signed)
Chronic peripheral ischemia from since retinal vein occlusion, controlled in the past with PRP, recurrent CME only stabilized on intravitreal Eylea.  Resistant to Avastin, Lucentis in the past

## 2020-12-06 ENCOUNTER — Other Ambulatory Visit: Payer: Self-pay | Admitting: *Deleted

## 2020-12-06 DIAGNOSIS — E782 Mixed hyperlipidemia: Secondary | ICD-10-CM

## 2020-12-06 DIAGNOSIS — Z720 Tobacco use: Secondary | ICD-10-CM

## 2020-12-06 MED ORDER — ATORVASTATIN CALCIUM 40 MG PO TABS: 40.0000 mg | ORAL_TABLET | Freq: Every day | ORAL | 3 refills | Status: DC

## 2020-12-13 DIAGNOSIS — Z85828 Personal history of other malignant neoplasm of skin: Secondary | ICD-10-CM | POA: Diagnosis not present

## 2020-12-13 DIAGNOSIS — Z08 Encounter for follow-up examination after completed treatment for malignant neoplasm: Secondary | ICD-10-CM | POA: Diagnosis not present

## 2021-01-20 ENCOUNTER — Other Ambulatory Visit: Payer: Self-pay

## 2021-01-20 ENCOUNTER — Encounter (INDEPENDENT_AMBULATORY_CARE_PROVIDER_SITE_OTHER): Payer: Self-pay | Admitting: Ophthalmology

## 2021-01-20 ENCOUNTER — Ambulatory Visit (INDEPENDENT_AMBULATORY_CARE_PROVIDER_SITE_OTHER): Payer: Medicare HMO | Admitting: Ophthalmology

## 2021-01-20 DIAGNOSIS — H353114 Nonexudative age-related macular degeneration, right eye, advanced atrophic with subfoveal involvement: Secondary | ICD-10-CM | POA: Diagnosis not present

## 2021-01-20 DIAGNOSIS — H34811 Central retinal vein occlusion, right eye, with macular edema: Secondary | ICD-10-CM

## 2021-01-20 DIAGNOSIS — H35371 Puckering of macula, right eye: Secondary | ICD-10-CM | POA: Diagnosis not present

## 2021-01-20 MED ORDER — AFLIBERCEPT 2MG/0.05ML IZ SOLN FOR KALEIDOSCOPE
2.0000 mg | INTRAVITREAL | Status: AC | PRN
  Administered 2021-01-20: 2 mg via INTRAVITREAL

## 2021-01-20 NOTE — Progress Notes (Signed)
01/20/2021     CHIEF COMPLAINT Patient presents for Retina Follow Up (9 Wk F/U OD, poss Eylea OD//Pt denies noticeable changes to [place] OU since last visit. Pt denies ocular pain, flashes of light, or floaters OU. //)   HISTORY OF PRESENT ILLNESS: SERGEY ISHLER is a 70 y.o. male who presents to the clinic today for:   HPI    Retina Follow Up    Patient presents with  CRVO/BRVO.  In right eye.  This started 9 weeks ago.  Severity is mild.  Duration of 9 weeks.  Since onset it is stable. Additional comments: 9 Wk F/U OD, poss Eylea OD  Pt denies noticeable changes to [place] OU since last visit. Pt denies ocular pain, flashes of light, or floaters OU.          Last edited by Rockie Neighbours, [place] on 01/20/2021  8:33 AM. (History)      Referring physician: Carlena Hurl, PA-C [place],  [place] [address]  HISTORICAL INFORMATION:   Selected notes from the MEDICAL RECORD NUMBER    Lab Results  Component Value Date   HGBA1C 6.3 (H) 07/19/2019     CURRENT MEDICATIONS: No current outpatient medications on file. (Ophthalmic Drugs)   No current facility-administered medications for this visit. (Ophthalmic Drugs)   Current Outpatient Medications (Other)  Medication Sig  . aspirin EC 81 MG tablet Take 1 tablet (81 mg total) by mouth daily.  Marland Kitchen atorvastatin (LIPITOR) 40 MG tablet Take 1 tablet (40 mg total) by mouth daily.  . naproxen sodium (ALEVE) 220 MG tablet Take 220 mg by mouth.   Current Facility-Administered Medications (Other)  Medication Route  . 0.9 %  sodium chloride infusion Intravenous      REVIEW OF SYSTEMS:    ALLERGIES Allergies  Allergen Reactions  . Penicillins     As child     PAST MEDICAL HISTORY Past Medical History:  Diagnosis Date  . Abnormal CT of the chest 06/08/2018  . Allergy    mild  . Atherosclerosis of aorta ([place]) 06/08/2018  . Cataract    removed right eye  . Coronary artery disease involving native heart without  angina pectoris 06/08/2018  . Erectile dysfunction 06/08/2018  . Eye disease 02/18/2017  . GERD (gastroesophageal reflux disease)    occ  . HDL deficiency 02/19/2017  . Hyperlipidemia   . Impacted cerumen of right ear 02/18/2017  . Leukocytosis 02/19/2017  . Mixed dyslipidemia 02/19/2017  . Overweight 02/18/2017  . Smoking   . Tobacco use 02/18/2017   Past Surgical History:  Procedure Laterality Date  . EYE SURGERY     gets therapy injections for vessel disease, hx/o cataract surgery  . MOUTH SURGERY      FAMILY HISTORY Family History  Problem Relation Age of Onset  . Other Mother        eye disease, died of old age  . Other Father        health history unknown  . Cancer Neg Hx   . Diabetes Neg Hx   . Heart disease Neg Hx   . Stroke Neg Hx   . Hyperlipidemia Neg Hx   . Hypertension Neg Hx   . Colon cancer Neg Hx   . Colon polyps Neg Hx   . Rectal cancer Neg Hx   . Stomach cancer Neg Hx     SOCIAL HISTORY Social History   Tobacco Use  . Smoking status: Current Every Day Smoker  Packs/day: 1.00    Years: 50.00    Pack years: 50.00    Types: Cigarettes  . Smokeless tobacco: Never Used  Vaping Use  . Vaping Use: Never used  Substance Use Topics  . Alcohol use: Yes    Comment: occ  . Drug use: No         OPHTHALMIC EXAM: Base Eye Exam    Visual Acuity (ETDRS)      Right Left   Dist cc 20/400 20/20 -1   Dist ph cc NI    Correction: Glasses       Tonometry (Tonopen, 8:33 AM)      Right Left   Pressure 19 17       Pupils      Pupils Dark Light Shape React APD   Right PERRL 6 5 Round Brisk None   Left PERRL 6 5 Round Brisk None       Visual Fields (Counting fingers)      Left Right    Full Full       Extraocular Movement      Right Left    Full Full       Neuro/Psych    Oriented x3: Yes   Mood/Affect: Normal       Dilation    Right eye: 1.0% Mydriacyl, 2.5% Phenylephrine @ 8:37 AM        Slit Lamp and Fundus Exam    External Exam       Right Left   External Normal Normal       Slit Lamp Exam      Right Left   Lids/Lashes Normal Normal   Conjunctiva/Sclera White and quiet White and quiet   Cornea Clear Clear   Anterior Chamber Deep and quiet Deep and quiet   Iris Round and reactive Round and reactive   Lens Posterior chamber intraocular lens    Anterior Vitreous Normal Normal       Fundus Exam      Right Left   Posterior Vitreous Vitreal papillary traction    Disc Collateralization on the nerve, no NVD    C/D Ratio 0.0    Macula Geographic atrophy, Macular thickening, Cystoid macular edema, Microaneurysms, Epiretinal membrane    Vessels Old central retinal vein occlusion, ischemic at present.    Periphery Normal, good PRP 360           IMAGING AND PROCEDURES  Imaging and Procedures for 01/20/21  OCT, Retina - OU - Both Eyes       Right Eye Quality was good. Scan locations included subfoveal. Central Foveal Thickness: 222. Findings include abnormal foveal contour, outer retinal atrophy, central retinal atrophy, epiretinal membrane.   Left Eye Quality was good. Scan locations included subfoveal. Central Foveal Thickness: 308. Progression has been stable. Findings include normal foveal contour, vitreomacular adhesion .   Notes OD persistent foveal atrophy from longstanding chronic recurrent CME from CRV O.  Less CME OD at 9-week follow-up today, thus viable macular retina remains as it is r responsive to treatment with Eylea currently at 9-week follow-up today we will repeat injection today and examination again around 9 weeks       Intravitreal Injection, Pharmacologic Agent - OD - Right Eye       Time Out 01/20/2021. 9:10 AM. Confirmed correct patient, procedure, site, and patient consented.   Anesthesia Topical anesthesia was used. Anesthetic medications included Akten 3.5%.   Procedure Preparation included Ofloxacin , 5% betadine to ocular surface, 10%  betadine to eyelids. A 30 gauge  needle was used.   Injection:  2 mg aflibercept Alfonse Flavors) SOLN   NDC: [id number], Lot: [id number]   Route: Intravitreal, Site: Right Eye, Waste: 0 mg  Post-op Post injection exam found visual acuity of at least counting fingers. The patient tolerated the procedure well. There were no complications. The patient received written and verbal post procedure care education. Post injection medications were not given.                 ASSESSMENT/PLAN:  Macular pucker, right eye Minor, no impact on acuity right eye  Central retinal vein occlusion with macular edema of right eye CME did improve on 9-week follow-up post Eylea from underlying CRV O, will repeat injection today and examination again around 9 weeks  Advanced nonexudative age-related macular degeneration of right eye with subfoveal involvement Central geographic atrophy OD accounts for acuity      ICD-10-CM   1. Central retinal vein occlusion with macular edema of right eye  H34.8110 OCT, Retina - OU - Both Eyes    Intravitreal Injection, Pharmacologic Agent - OD - Right Eye    aflibercept (EYLEA) SOLN 2 mg  2. Macular pucker, right eye  H35.371   3. Advanced nonexudative age-related macular degeneration of right eye with subfoveal involvement  H35.3114     1.  Repeat injection intravitreal Eylea OD today and examination again around 9 weeks  2.  3.  Ophthalmic Meds Ordered this visit:  Meds ordered this encounter  Medications  . aflibercept (EYLEA) SOLN 2 mg       Return in about 9 weeks (around 03/24/2021) for DILATE OU, EYLEA OCT, OD.  There are no Patient Instructions on file for this visit.   Explained the diagnoses, plan, and follow up with the patient and they expressed understanding.  Patient expressed understanding of the importance of proper follow up care.   Clent Demark Isabellarose Kope M.D. Diseases & Surgery of the Retina and Vitreous Retina & Diabetic [place] 01/20/21     Abbreviations: M myopia  (nearsighted); A astigmatism; H hyperopia (farsighted); P presbyopia; Mrx spectacle prescription;  CTL contact lenses; OD right eye; OS left eye; OU both eyes  XT exotropia; ET esotropia; PEK punctate epithelial keratitis; PEE punctate epithelial erosions; DES dry eye syndrome; MGD meibomian gland dysfunction; ATs artificial tears; PFAT's preservative free artificial tears; [place] nuclear sclerotic cataract; PSC posterior subcapsular cataract; ERM epi-retinal membrane; PVD posterior vitreous detachment; RD retinal detachment; DM diabetes mellitus; DR diabetic retinopathy; NPDR non-proliferative diabetic retinopathy; PDR proliferative diabetic retinopathy; CSME clinically significant macular edema; DME diabetic macular edema; dbh dot blot hemorrhages; CWS cotton wool spot; POAG primary open angle glaucoma; C/D cup-to-disc ratio; HVF humphrey visual field; GVF goldmann visual field; OCT optical coherence tomography; IOP intraocular pressure; BRVO Branch retinal vein occlusion; CRVO central retinal vein occlusion; CRAO central retinal artery occlusion; BRAO branch retinal artery occlusion; RT retinal tear; SB scleral buckle; PPV pars plana vitrectomy; VH Vitreous hemorrhage; PRP panretinal laser photocoagulation; IVK intravitreal kenalog; VMT vitreomacular traction; MH Macular hole;  NVD neovascularization of the disc; NVE neovascularization elsewhere; AREDS age related eye disease study; ARMD age related macular degeneration; POAG primary open angle glaucoma; EBMD epithelial/anterior basement membrane dystrophy; ACIOL anterior chamber intraocular lens; IOL intraocular lens; PCIOL posterior chamber intraocular lens; Phaco/IOL phacoemulsification with intraocular lens placement; [place] photorefractive keratectomy; LASIK laser assisted in situ keratomileusis; HTN hypertension; DM diabetes mellitus; COPD chronic obstructive pulmonary disease

## 2021-01-20 NOTE — Assessment & Plan Note (Signed)
CME did improve on 9-week follow-up post Eylea from underlying CRV O, will repeat injection today and examination again around 9 weeks

## 2021-01-20 NOTE — Assessment & Plan Note (Signed)
Central geographic atrophy OD accounts for acuity

## 2021-01-20 NOTE — Assessment & Plan Note (Signed)
Minor, no impact on acuity right eye

## 2021-01-21 ENCOUNTER — Encounter (INDEPENDENT_AMBULATORY_CARE_PROVIDER_SITE_OTHER): Payer: Medicare HMO | Admitting: Ophthalmology

## 2021-02-04 ENCOUNTER — Ambulatory Visit: Payer: Medicare HMO | Admitting: Cardiovascular Disease

## 2021-02-04 ENCOUNTER — Other Ambulatory Visit: Payer: Self-pay

## 2021-02-04 ENCOUNTER — Encounter: Payer: Self-pay | Admitting: Cardiovascular Disease

## 2021-02-04 VITALS — BP 126/82 | HR 55 | Ht 68.0 in | Wt 179.0 lb

## 2021-02-04 DIAGNOSIS — E785 Hyperlipidemia, unspecified: Secondary | ICD-10-CM | POA: Diagnosis not present

## 2021-02-04 DIAGNOSIS — I739 Peripheral vascular disease, unspecified: Secondary | ICD-10-CM

## 2021-02-04 DIAGNOSIS — I2510 Atherosclerotic heart disease of native coronary artery without angina pectoris: Secondary | ICD-10-CM | POA: Diagnosis not present

## 2021-02-04 DIAGNOSIS — Z720 Tobacco use: Secondary | ICD-10-CM | POA: Diagnosis not present

## 2021-02-04 NOTE — Progress Notes (Signed)
Cardiology Office Note   Date:  02/04/2021   ID:  Trust, Leh [birth year], MRN [id number]  PCP:  Carlena Hurl, PA-C  Cardiologist:   Kathlyn Sacramento, MD   No chief complaint on file.     History of Present Illness: Brent Espinoza is a 70 y.o. male who is here today for af follow up visit regarding PAD.  The patient has prolonged history of smoking and hyperlipidemia.  He smokes 1 pack/day and has been doing so since 70 years old.  He has no family history of coronary artery disease.  He also has known history of coronary artery calcifications noted on prior CT scan He has known history of peripheral arterial disease with mild bilateral calf claudication and erectile dysfunction.   He had noninvasive vascular studies done in 2019 which showed mildly reduced ABI bilaterally in the 0.7 range.  Aortoiliac duplex showed severe bilateral common iliac artery stenosis in addition to an occluded left SFA. The patient did not want to have procedures done and this was treated medically.    He has been doing well and reports stable bilateral calf claudication which does not happen with regular activities.  This most noticeable when he goes uphill or if he works in the yard more than 30 minutes.  No chest pain or shortness of breath.  Past Medical History:  Diagnosis Date  . Abnormal CT of the chest 06/08/2018  . Allergy    mild  . Atherosclerosis of aorta ([place]) 06/08/2018  . Cataract    removed right eye  . Coronary artery disease involving native heart without angina pectoris 06/08/2018  . Erectile dysfunction 06/08/2018  . Eye disease 02/18/2017  . GERD (gastroesophageal reflux disease)    occ  . HDL deficiency 02/19/2017  . Hyperlipidemia   . Impacted cerumen of right ear 02/18/2017  . Leukocytosis 02/19/2017  . Mixed dyslipidemia 02/19/2017  . Overweight 02/18/2017  . Smoking   . Tobacco use 02/18/2017    Past Surgical History:  Procedure Laterality Date  . EYE  SURGERY     gets therapy injections for vessel disease, hx/o cataract surgery  . MOUTH SURGERY       Current Outpatient Medications  Medication Sig Dispense Refill  . aspirin EC 81 MG tablet Take 1 tablet (81 mg total) by mouth daily. 90 tablet 3  . atorvastatin (LIPITOR) 40 MG tablet Take 1 tablet (40 mg total) by mouth daily. 90 tablet 3  . naproxen sodium (ALEVE) 220 MG tablet Take 220 mg by mouth.     Current Facility-Administered Medications  Medication Dose Route Frequency Provider Last Rate Last Admin  . 0.9 %  sodium chloride infusion  500 mL Intravenous Once Armbruster, Carlota Raspberry, MD        Allergies:   Penicillins    Social History:  The patient  reports that he has been smoking cigarettes. He has a 50.00 pack-year smoking history. He has never used smokeless tobacco. He reports current alcohol use. He reports that he does not use drugs.   Family History:  The patient's family history is negative for coronary artery disease, peripheral arterial disease or cancer.   ROS:  Please see the history of present illness.   Otherwise, review of systems are positive for none.   All other systems are reviewed and negative.    PHYSICAL EXAM: VS:  BP 126/82   Pulse (!) 55   Ht 5\' 8"  (1.727 m)   Wt  179 lb (81.2 kg)   SpO2 98%   BMI 27.22 kg/m  , BMI Body mass index is 27.22 kg/m. GEN: Well nourished, well developed, in no acute distress  HEENT: normal  Neck: no JVD, carotid bruits, or masses Cardiac: RRR; no murmurs, rubs, or gallops,no edema  Respiratory:  clear to auscultation bilaterally, normal work of breathing GI: soft, nontender, nondistended, + BS MS: no deformity or atrophy  Skin: warm and dry, no rash Neuro:  Strength and sensation are intact Psych: euthymic mood, full affect    EKG:  EKG is ordered today. EKG showed sinus bradycardia with sinus arrhythmia.  No significant ST changes.   Recent Labs: No results found for requested labs within last 8760  hours.    Lipid Panel    Component Value Date/Time   CHOL 101 07/19/2019 1056   TRIG 62 07/19/2019 1056   HDL 30 (L) 07/19/2019 1056   CHOLHDL 3.4 07/19/2019 1056   CHOLHDL 5.4 (H) 02/18/2017 0939   VLDL 12 02/18/2017 0939   LDLCALC 57 07/19/2019 1056      Wt Readings from Last 3 Encounters:  02/04/21 179 lb (81.2 kg)  01/30/20 179 lb 3.2 oz (81.3 kg)  07/19/19 177 lb 12.8 oz (80.6 kg)       No flowsheet data found.    ASSESSMENT AND PLAN:  1.  Peripheral arterial disease: He continues to have stable mild bilateral leg claudication Rutherford class II.   His symptoms do not seem to be lifestyle limiting and thus I recommend continuing medical therapy.  He does have significant erectile dysfunction which is likely due to peripheral arterial disease.    2.  Tobacco use: I again discussed with him the importance of smoking cessation.  3.  Hyperlipidemia: Continue treatment with atorvastatin.  Most recent lipid profile showed an LDL of 57.  4.  Coronary atherosclerosis noted on previous CT scan: Currently with no convincing symptoms of angina.    Disposition:   FU with me in 12 months  Signed,  Kathlyn Sacramento, MD  02/04/2021 10:21 AM    Page

## 2021-02-04 NOTE — Patient Instructions (Signed)

## 2021-03-24 ENCOUNTER — Ambulatory Visit (INDEPENDENT_AMBULATORY_CARE_PROVIDER_SITE_OTHER): Payer: Medicare HMO | Admitting: Ophthalmology

## 2021-03-24 ENCOUNTER — Other Ambulatory Visit: Payer: Self-pay

## 2021-03-24 ENCOUNTER — Encounter (INDEPENDENT_AMBULATORY_CARE_PROVIDER_SITE_OTHER): Payer: Self-pay | Admitting: Ophthalmology

## 2021-03-24 DIAGNOSIS — H2512 Age-related nuclear cataract, left eye: Secondary | ICD-10-CM | POA: Diagnosis not present

## 2021-03-24 DIAGNOSIS — H43822 Vitreomacular adhesion, left eye: Secondary | ICD-10-CM | POA: Diagnosis not present

## 2021-03-24 DIAGNOSIS — H34811 Central retinal vein occlusion, right eye, with macular edema: Secondary | ICD-10-CM | POA: Diagnosis not present

## 2021-03-24 MED ORDER — AFLIBERCEPT 2MG/0.05ML IZ SOLN FOR KALEIDOSCOPE
2.0000 mg | INTRAVITREAL | Status: AC | PRN
  Administered 2021-03-24: 2 mg via INTRAVITREAL

## 2021-03-24 NOTE — Assessment & Plan Note (Signed)
Stable physiologic no change over time

## 2021-03-24 NOTE — Assessment & Plan Note (Signed)
Minor OS no impact on acuity

## 2021-03-24 NOTE — Progress Notes (Signed)
03/24/2021     CHIEF COMPLAINT Patient presents for Retina Follow Up (9 week fu OU and Eylea OD/Pt states VA OU stable since last visit. Pt denies FOL, floaters, or ocular pain OU. /)   HISTORY OF PRESENT ILLNESS: ART LEVAN is a 70 y.o. male who presents to the clinic today for:   HPI     Retina Follow Up           Diagnosis: CRVO/BRVO   Laterality: right eye   Onset: 9 weeks ago   Severity: mild   Duration: 9 weeks   Course: stable   Comments: 9 week fu OU and Eylea OD Pt states VA OU stable since last visit. Pt denies FOL, floaters, or ocular pain OU.         Last edited by Kendra Opitz, COA on 03/24/2021  9:03 AM.      Referring physician: Carlena Hurl, PA-C [place],  [place] [id number]  HISTORICAL INFORMATION:   Selected notes from the MEDICAL RECORD NUMBER    Lab Results  Component Value Date   HGBA1C 6.3 (H) 07/19/2019     CURRENT MEDICATIONS: No current outpatient medications on file. (Ophthalmic Drugs)   No current facility-administered medications for this visit. (Ophthalmic Drugs)   Current Outpatient Medications (Other)  Medication Sig   aspirin EC 81 MG tablet Take 1 tablet (81 mg total) by mouth daily.   atorvastatin (LIPITOR) 40 MG tablet Take 1 tablet (40 mg total) by mouth daily.   naproxen sodium (ALEVE) 220 MG tablet Take 220 mg by mouth.   Current Facility-Administered Medications (Other)  Medication Route   0.9 %  sodium chloride infusion Intravenous      REVIEW OF SYSTEMS:    ALLERGIES Allergies  Allergen Reactions   Penicillins     As child     PAST MEDICAL HISTORY Past Medical History:  Diagnosis Date   Abnormal CT of the chest 06/08/2018   Allergy    mild   Atherosclerosis of aorta ([address]) 06/08/2018   Cataract    removed right eye   Coronary artery disease involving native heart without angina pectoris 06/08/2018   Erectile dysfunction 06/08/2018   Eye disease 02/18/2017   GERD  (gastroesophageal reflux disease)    occ   HDL deficiency 02/19/2017   Hyperlipidemia    Impacted cerumen of right ear 02/18/2017   Leukocytosis 02/19/2017   Mixed dyslipidemia 02/19/2017   Overweight 02/18/2017   Smoking    Tobacco use 02/18/2017   Past Surgical History:  Procedure Laterality Date   EYE SURGERY     gets therapy injections for vessel disease, hx/o cataract surgery   MOUTH SURGERY      FAMILY HISTORY Family History  Problem Relation Age of Onset   Other Mother        eye disease, died of old age   Other Father        health history unknown   Cancer Neg Hx    Diabetes Neg Hx    Heart disease Neg Hx    Stroke Neg Hx    Hyperlipidemia Neg Hx    Hypertension Neg Hx    Colon cancer Neg Hx    Colon polyps Neg Hx    Rectal cancer Neg Hx    Stomach cancer Neg Hx     SOCIAL HISTORY Social History   Tobacco Use   Smoking status: Every Day    Packs/day: 1.00  Years: 50.00    Pack years: 50.00    Types: Cigarettes   Smokeless tobacco: Never  Vaping Use   Vaping Use: Never used  Substance Use Topics   Alcohol use: Yes    Comment: occ   Drug use: No         OPHTHALMIC EXAM:  Base Eye Exam     Visual Acuity (ETDRS)       Right Left   Dist cc 20/200 20/20 -1   Dist ph cc 20/60 +2          Tonometry (Tonopen, 9:07 AM)       Right Left   Pressure 21 19         Pupils       Pupils Dark Light Shape React APD   Right PERRL 6 5 Round Brisk None   Left PERRL 6 5 Round Brisk None         Visual Fields (Counting fingers)       Left Right    Full Full         Extraocular Movement       Right Left    Full Full         Neuro/Psych     Oriented x3: Yes   Mood/Affect: Normal         Dilation     Both eyes: 1.0% Mydriacyl, 2.5% Phenylephrine @ 9:07 AM           Slit Lamp and Fundus Exam     External Exam       Right Left   External Normal Normal         Slit Lamp Exam       Right Left   Lids/Lashes  Normal Normal   Conjunctiva/Sclera White and quiet White and quiet   Cornea Clear Clear   Anterior Chamber Deep and quiet Deep and quiet   Iris Round and reactive Round and reactive   Lens Posterior chamber intraocular lens 2+ Nuclear sclerosis   Anterior Vitreous Normal Normal         Fundus Exam       Right Left   Posterior Vitreous Vitreal papillary traction Normal   Disc Collateralization on the nerve, no NVD Normal   C/D Ratio 0.0 0.0   Macula Geographic atrophy, Macular thickening, Cystoid macular edema, Microaneurysms, Epiretinal membrane Normal   Vessels Old central retinal vein occlusion, ischemic at present. Normal   Periphery Normal, good PRP 360 Normal            IMAGING AND PROCEDURES  Imaging and Procedures for 03/24/21  OCT, Retina - OU - Both Eyes       Right Eye Quality was good. Scan locations included subfoveal. Central Foveal Thickness: 214. Findings include abnormal foveal contour, outer retinal atrophy, central retinal atrophy, epiretinal membrane.   Left Eye Quality was good. Scan locations included subfoveal. Central Foveal Thickness: 306. Progression has been stable. Findings include normal foveal contour, vitreomacular adhesion .   Notes OD persistent foveal atrophy from longstanding chronic recurrent CME from CRV O.  Less CME OD at 9-week follow-up today, thus viable macular retina remains as it is  responsive to treatment with Eylea currently at 9-week follow-up today we will repeat injection today and examination again around 9 weeks     Intravitreal Injection, Pharmacologic Agent - OD - Right Eye       Time Out 03/24/2021. 10:15 AM. Confirmed correct patient, procedure, site, and patient consented.  Anesthesia Topical anesthesia was used. Anesthetic medications included [place] 3.5%.   Procedure Preparation included Ofloxacin , 5% betadine to ocular surface, 10% betadine to eyelids. A 30 gauge needle was used.   Injection: 2 mg  aflibercept 2 MG/0.05ML   Route: Intravitreal, Site: Right Eye   NDC: [id number], Lot: [id number], Waste: 0 mL   Post-op Post injection exam found visual acuity of at least counting fingers. The patient tolerated the procedure well. There were no complications. The patient received written and verbal post procedure care education. Post injection medications were not given.              ASSESSMENT/PLAN:  Central retinal vein occlusion with macular edema of right eye Longstanding CME, still improved and stabilized at 9-week follow-up.  We will repeat injection Eylea today to maximize visual acuity  Vitreomacular adhesion of left eye Stable physiologic no change over time  Nuclear sclerotic cataract of left eye Minor OS no impact on acuity     ICD-10-CM   1. Central retinal vein occlusion with macular edema of right eye  H34.8110 OCT, Retina - OU - Both Eyes    Intravitreal Injection, Pharmacologic Agent - OD - Right Eye    aflibercept (EYLEA) SOLN 2 mg    2. Vitreomacular adhesion of left eye  H43.822     3. Nuclear sclerotic cataract of left eye  H25.12       1.  OD, continues to have CME nicely controlled at 9-week interval post intravitreal Eylea.  Multiple recurrences in the past due to macular nonperfusion from underlying CRV O.  Repeat injection Eylea OD today  2.  Proliferative component of CRV O disease, neovascularization control in the past with PRP stable  3.  Mild nuclear sclerotic cataract left eye observe  Ophthalmic Meds Ordered this visit:  Meds ordered this encounter  Medications   aflibercept (EYLEA) SOLN 2 mg       Return in about 9 weeks (around 05/26/2021) for dilate, OD, EYLEA OCT.  There are no Patient Instructions on file for this visit.   Explained the diagnoses, plan, and follow up with the patient and they expressed understanding.  Patient expressed understanding of the importance of proper follow up care.   Brent Espinoza  M.D. Diseases & Surgery of the Retina and Vitreous Retina & Diabetic [place] 03/24/21     Abbreviations: M myopia (nearsighted); A astigmatism; H hyperopia (farsighted); P presbyopia; Mrx spectacle prescription;  CTL contact lenses; OD right eye; OS left eye; OU both eyes  XT exotropia; ET esotropia; PEK punctate epithelial keratitis; PEE punctate epithelial erosions; DES dry eye syndrome; MGD meibomian gland dysfunction; ATs artificial tears; PFAT's preservative free artificial tears; [place] nuclear sclerotic cataract; PSC posterior subcapsular cataract; ERM epi-retinal membrane; PVD posterior vitreous detachment; RD retinal detachment; DM diabetes mellitus; DR diabetic retinopathy; NPDR non-proliferative diabetic retinopathy; PDR proliferative diabetic retinopathy; CSME clinically significant macular edema; DME diabetic macular edema; dbh dot blot hemorrhages; CWS cotton wool spot; POAG primary open angle glaucoma; C/D cup-to-disc ratio; HVF humphrey visual field; GVF goldmann visual field; OCT optical coherence tomography; IOP intraocular pressure; BRVO Branch retinal vein occlusion; CRVO central retinal vein occlusion; CRAO central retinal artery occlusion; BRAO branch retinal artery occlusion; RT retinal tear; SB scleral buckle; PPV pars plana vitrectomy; VH Vitreous hemorrhage; PRP panretinal laser photocoagulation; IVK intravitreal kenalog; VMT vitreomacular traction; MH Macular hole;  NVD neovascularization of the disc; NVE neovascularization elsewhere; AREDS age related eye disease study; ARMD age related  macular degeneration; POAG primary open angle glaucoma; EBMD epithelial/anterior basement membrane dystrophy; ACIOL anterior chamber intraocular lens; IOL intraocular lens; PCIOL posterior chamber intraocular lens; Phaco/IOL phacoemulsification with intraocular lens placement; [place] photorefractive keratectomy; LASIK laser assisted in situ keratomileusis; HTN hypertension; DM diabetes mellitus; COPD  chronic obstructive pulmonary disease

## 2021-03-24 NOTE — Assessment & Plan Note (Signed)
Longstanding CME, still improved and stabilized at 9-week follow-up.  We will repeat injection Eylea today to maximize visual acuity

## 2021-03-25 ENCOUNTER — Encounter (INDEPENDENT_AMBULATORY_CARE_PROVIDER_SITE_OTHER): Payer: Medicare HMO | Admitting: Ophthalmology

## 2021-05-26 ENCOUNTER — Encounter (INDEPENDENT_AMBULATORY_CARE_PROVIDER_SITE_OTHER): Payer: Medicare HMO | Admitting: Ophthalmology

## 2021-05-26 ENCOUNTER — Other Ambulatory Visit: Payer: Self-pay

## 2021-05-26 ENCOUNTER — Ambulatory Visit (INDEPENDENT_AMBULATORY_CARE_PROVIDER_SITE_OTHER): Payer: Medicare HMO | Admitting: Ophthalmology

## 2021-05-26 ENCOUNTER — Encounter (INDEPENDENT_AMBULATORY_CARE_PROVIDER_SITE_OTHER): Payer: Self-pay | Admitting: Ophthalmology

## 2021-05-26 DIAGNOSIS — H34811 Central retinal vein occlusion, right eye, with macular edema: Secondary | ICD-10-CM

## 2021-05-26 DIAGNOSIS — H2512 Age-related nuclear cataract, left eye: Secondary | ICD-10-CM

## 2021-05-26 DIAGNOSIS — H43822 Vitreomacular adhesion, left eye: Secondary | ICD-10-CM

## 2021-05-26 MED ORDER — AFLIBERCEPT 2MG/0.05ML IZ SOLN FOR KALEIDOSCOPE
2.0000 mg | INTRAVITREAL | Status: AC | PRN
  Administered 2021-05-26: 2 mg via INTRAVITREAL

## 2021-05-26 NOTE — Assessment & Plan Note (Signed)
Physiologic OS by OCT

## 2021-05-26 NOTE — Assessment & Plan Note (Addendum)
OD stable and improved at 9-week interval, post injection Eylea, repeat injection today

## 2021-05-26 NOTE — Assessment & Plan Note (Signed)
Moderate, not symptomatic

## 2021-05-26 NOTE — Progress Notes (Signed)
05/26/2021     CHIEF COMPLAINT Patient presents for Retina Follow Up (9 week fu OU and Eylea OD/Pt states VA OU stable since last visit. Pt denies FOL, floaters, or ocular pain OU. /)   HISTORY OF PRESENT ILLNESS: Brent Espinoza is a 70 y.o. male who presents to the clinic today for:   HPI     Retina Follow Up           Diagnosis: CRVO/BRVO   Laterality: right eye   Onset: 9 weeks ago   Severity: mild   Duration: 9 weeks   Course: stable   Comments: 9 week fu OU and Eylea OD Pt states VA OU stable since last visit. Pt denies FOL, floaters, or ocular pain OU.           Comments   9 week fu oct eylea od Patient states vision is stable and unchanged since last visit. Denies any new floaters or FOL.       Last edited by Laurin Coder, COA on 05/26/2021  8:37 AM.      Referring physician: Carlena Hurl, PA-C [place],  [place] [id number]  HISTORICAL INFORMATION:   Selected notes from the MEDICAL RECORD NUMBER    Lab Results  Component Value Date   HGBA1C 6.3 (H) 07/19/2019     CURRENT MEDICATIONS: No current outpatient medications on file. (Ophthalmic Drugs)   No current facility-administered medications for this visit. (Ophthalmic Drugs)   Current Outpatient Medications (Other)  Medication Sig   aspirin EC 81 MG tablet Take 1 tablet (81 mg total) by mouth daily.   atorvastatin (LIPITOR) 40 MG tablet Take 1 tablet (40 mg total) by mouth daily.   naproxen sodium (ALEVE) 220 MG tablet Take 220 mg by mouth.   Current Facility-Administered Medications (Other)  Medication Route   0.9 %  sodium chloride infusion Intravenous      REVIEW OF SYSTEMS:    ALLERGIES Allergies  Allergen Reactions   Penicillins     As child     PAST MEDICAL HISTORY Past Medical History:  Diagnosis Date   Abnormal CT of the chest 06/08/2018   Allergy    mild   Atherosclerosis of aorta ([place]) 06/08/2018   Cataract    removed right eye    Coronary artery disease involving native heart without angina pectoris 06/08/2018   Erectile dysfunction 06/08/2018   Eye disease 02/18/2017   GERD (gastroesophageal reflux disease)    occ   HDL deficiency 02/19/2017   Hyperlipidemia    Impacted cerumen of right ear 02/18/2017   Leukocytosis 02/19/2017   Mixed dyslipidemia 02/19/2017   Overweight 02/18/2017   Smoking    Tobacco use 02/18/2017   Past Surgical History:  Procedure Laterality Date   EYE SURGERY     gets therapy injections for vessel disease, hx/o cataract surgery   MOUTH SURGERY      FAMILY HISTORY Family History  Problem Relation Age of Onset   Other Mother        eye disease, died of old age   Other Father        health history unknown   Cancer Neg Hx    Diabetes Neg Hx    Heart disease Neg Hx    Stroke Neg Hx    Hyperlipidemia Neg Hx    Hypertension Neg Hx    Colon cancer Neg Hx    Colon polyps Neg Hx    Rectal cancer Neg Hx  Stomach cancer Neg Hx     SOCIAL HISTORY Social History   Tobacco Use   Smoking status: Every Day    Packs/day: 1.00    Years: 50.00    Pack years: 50.00    Types: Cigarettes   Smokeless tobacco: Never  Vaping Use   Vaping Use: Never used  Substance Use Topics   Alcohol use: Yes    Comment: occ   Drug use: No         OPHTHALMIC EXAM:  Base Eye Exam     Visual Acuity (ETDRS)       Right Left   Dist cc 20/200 20/20 -2   Dist ph cc 20/100 -1     Correction: Glasses         Tonometry (Tonopen, 8:43 AM)       Right Left   Pressure 19 19         Pupils       Pupils Dark Light Shape React APD   Right PERRL 6 5 Round Brisk None   Left PERRL 6 5 Round Brisk None         Visual Fields (Counting fingers)       Left Right    Full Full         Extraocular Movement       Right Left    Full Full         Neuro/Psych     Oriented x3: Yes   Mood/Affect: Normal         Dilation     Right eye: 1.0% Mydriacyl, 2.5% Phenylephrine @ 8:43 AM            Slit Lamp and Fundus Exam     External Exam       Right Left   External Normal Normal         Slit Lamp Exam       Right Left   Lids/Lashes Normal Normal   Conjunctiva/Sclera White and quiet White and quiet   Cornea Clear Clear   Anterior Chamber Deep and quiet Deep and quiet   Iris Round and reactive Round and reactive   Lens Posterior chamber intraocular lens 2+ Nuclear sclerosis   Anterior Vitreous Normal Normal         Fundus Exam       Right Left   Posterior Vitreous Vitreal papillary traction    Disc Collateralization on the nerve, no NVD    C/D Ratio 0.0    Macula Geographic atrophy, Macular thickening, Cystoid macular edema, Microaneurysms, Epiretinal membrane    Vessels Old central retinal vein occlusion, ischemic at present.    Periphery Normal, good PRP 360             IMAGING AND PROCEDURES  Imaging and Procedures for 05/26/21  OCT, Retina - OU - Both Eyes       Right Eye Quality was good. Scan locations included subfoveal. Central Foveal Thickness: 207. Findings include abnormal foveal contour, outer retinal atrophy, central retinal atrophy, epiretinal membrane.   Left Eye Quality was good. Scan locations included subfoveal. Central Foveal Thickness: 309. Progression has been stable. Findings include normal foveal contour, vitreomacular adhesion .   Notes OD persistent foveal atrophy from longstanding chronic recurrent CME from CRV O.  Less CME OD at 9-week follow-up today, thus viable macular retina remains as it is  responsive to treatment with Eylea currently at 9-week follow-up today we will repeat injection today and examination again  around 9 weeks     Intravitreal Injection, Pharmacologic Agent - OD - Right Eye       Time Out 05/26/2021. 9:14 AM. Confirmed correct patient, procedure, site, and patient consented.   Anesthesia Topical anesthesia was used. Anesthetic medications included [place] 3.5%.    Procedure Preparation included Ofloxacin , 5% betadine to ocular surface, 10% betadine to eyelids. A 30 gauge needle was used.   Injection: 2 mg aflibercept 2 MG/0.05ML   Route: Intravitreal, Site: Right Eye   NDC: [id number], Lot: [id number], Waste: 0 mL   Post-op Post injection exam found visual acuity of at least counting fingers. The patient tolerated the procedure well. There were no complications. The patient received written and verbal post procedure care education. Post injection medications were not given.              ASSESSMENT/PLAN:  Nuclear sclerotic cataract of left eye Moderate, not symptomatic  Vitreomacular adhesion of left eye Physiologic OS by OCT  Central retinal vein occlusion with macular edema of right eye OD stable and improved at 9-week interval, post injection Eylea, repeat injection today     ICD-10-CM   1. Central retinal vein occlusion with macular edema of right eye  H34.8110 OCT, Retina - OU - Both Eyes    Intravitreal Injection, Pharmacologic Agent - OD - Right Eye    aflibercept (EYLEA) SOLN 2 mg    2. Nuclear sclerotic cataract of left eye  H25.12     3. Vitreomacular adhesion of left eye  H43.822       1.  OD, overall improved and stable macular and retinal condition from old ischemic CRV O with acuity at now 20/200 as compared to count fingers off medications and massive CME.  There are multiple recurrences of CME at longer intervals of follow-up post ejection.  Currently at 9-week follow-up and stable repeat again today  2.  3.  Ophthalmic Meds Ordered this visit:  Meds ordered this encounter  Medications   aflibercept (EYLEA) SOLN 2 mg       Return in about 9 weeks (around 07/28/2021) for DILATE OU, EYLEA OCT, OD.  There are no Patient Instructions on file for this visit.   Explained the diagnoses, plan, and follow up with the patient and they expressed understanding.  Patient expressed understanding of the  importance of proper follow up care.   Clent Demark Geran Haithcock M.D. Diseases & Surgery of the Retina and Vitreous [facility] [place] 05/26/21     Abbreviations: M myopia (nearsighted); A astigmatism; H hyperopia (farsighted); P presbyopia; Mrx spectacle prescription;  CTL contact lenses; OD right eye; OS left eye; OU both eyes  XT exotropia; ET esotropia; PEK punctate epithelial keratitis; PEE punctate epithelial erosions; DES dry eye syndrome; MGD meibomian gland dysfunction; ATs artificial tears; PFAT's preservative free artificial tears; [place] nuclear sclerotic cataract; PSC posterior subcapsular cataract; ERM epi-retinal membrane; PVD posterior vitreous detachment; RD retinal detachment; DM diabetes mellitus; DR diabetic retinopathy; NPDR non-proliferative diabetic retinopathy; PDR proliferative diabetic retinopathy; CSME clinically significant macular edema; DME diabetic macular edema; dbh dot blot hemorrhages; CWS cotton wool spot; POAG primary open angle glaucoma; C/D cup-to-disc ratio; HVF humphrey visual field; GVF goldmann visual field; OCT optical coherence tomography; IOP intraocular pressure; BRVO Branch retinal vein occlusion; CRVO central retinal vein occlusion; CRAO central retinal artery occlusion; BRAO branch retinal artery occlusion; RT retinal tear; SB scleral buckle; PPV pars plana vitrectomy; VH Vitreous hemorrhage; PRP panretinal laser photocoagulation; IVK intravitreal kenalog; VMT  vitreomacular traction; MH Macular hole;  NVD neovascularization of the disc; NVE neovascularization elsewhere; AREDS age related eye disease study; ARMD age related macular degeneration; POAG primary open angle glaucoma; EBMD epithelial/anterior basement membrane dystrophy; ACIOL anterior chamber intraocular lens; IOL intraocular lens; PCIOL posterior chamber intraocular lens; Phaco/IOL phacoemulsification with intraocular lens placement; [place] photorefractive keratectomy; LASIK laser assisted in situ  keratomileusis; HTN hypertension; DM diabetes mellitus; COPD chronic obstructive pulmonary disease

## 2021-05-27 DIAGNOSIS — L218 Other seborrheic dermatitis: Secondary | ICD-10-CM | POA: Diagnosis not present

## 2021-05-27 DIAGNOSIS — Z08 Encounter for follow-up examination after completed treatment for malignant neoplasm: Secondary | ICD-10-CM | POA: Diagnosis not present

## 2021-05-27 DIAGNOSIS — L570 Actinic keratosis: Secondary | ICD-10-CM | POA: Diagnosis not present

## 2021-05-27 DIAGNOSIS — X32XXXD Exposure to sunlight, subsequent encounter: Secondary | ICD-10-CM | POA: Diagnosis not present

## 2021-05-27 DIAGNOSIS — Z85828 Personal history of other malignant neoplasm of skin: Secondary | ICD-10-CM | POA: Diagnosis not present

## 2021-05-27 DIAGNOSIS — C44311 Basal cell carcinoma of skin of nose: Secondary | ICD-10-CM | POA: Diagnosis not present

## 2021-05-28 ENCOUNTER — Other Ambulatory Visit: Payer: Self-pay

## 2021-05-28 ENCOUNTER — Telehealth: Payer: Self-pay | Admitting: Medical

## 2021-05-28 ENCOUNTER — Ambulatory Visit (INDEPENDENT_AMBULATORY_CARE_PROVIDER_SITE_OTHER): Payer: Medicare HMO | Admitting: Medical

## 2021-05-28 VITALS — BP 130/80 | HR 83 | Ht 68.0 in | Wt 170.0 lb

## 2021-05-28 DIAGNOSIS — D72829 Elevated white blood cell count, unspecified: Secondary | ICD-10-CM

## 2021-05-28 DIAGNOSIS — F419 Anxiety disorder, unspecified: Secondary | ICD-10-CM

## 2021-05-28 DIAGNOSIS — R7301 Impaired fasting glucose: Secondary | ICD-10-CM | POA: Diagnosis not present

## 2021-05-28 DIAGNOSIS — I739 Peripheral vascular disease, unspecified: Secondary | ICD-10-CM | POA: Diagnosis not present

## 2021-05-28 DIAGNOSIS — Z0000 Encounter for general adult medical examination without abnormal findings: Secondary | ICD-10-CM

## 2021-05-28 DIAGNOSIS — I700 Atherosclerosis of aorta: Secondary | ICD-10-CM | POA: Diagnosis not present

## 2021-05-28 DIAGNOSIS — I2510 Atherosclerotic heart disease of native coronary artery without angina pectoris: Secondary | ICD-10-CM

## 2021-05-28 DIAGNOSIS — Z7189 Other specified counseling: Secondary | ICD-10-CM

## 2021-05-28 DIAGNOSIS — Z720 Tobacco use: Secondary | ICD-10-CM

## 2021-05-28 DIAGNOSIS — E569 Vitamin deficiency, unspecified: Secondary | ICD-10-CM | POA: Insufficient documentation

## 2021-05-28 DIAGNOSIS — Z7185 Encounter for immunization safety counseling: Secondary | ICD-10-CM

## 2021-05-28 DIAGNOSIS — E782 Mixed hyperlipidemia: Secondary | ICD-10-CM

## 2021-05-28 DIAGNOSIS — Z129 Encounter for screening for malignant neoplasm, site unspecified: Secondary | ICD-10-CM

## 2021-05-28 DIAGNOSIS — Z136 Encounter for screening for cardiovascular disorders: Secondary | ICD-10-CM | POA: Diagnosis not present

## 2021-05-28 DIAGNOSIS — N529 Male erectile dysfunction, unspecified: Secondary | ICD-10-CM

## 2021-05-28 NOTE — Patient Instructions (Signed)
This visit was a preventative care visit, also known as wellness visit or routine physical.   Topics typically include healthy lifestyle, diet, exercise, preventative care, vaccinations, sick and well care, proper use of emergency dept and after hours care, as well as other concerns.     Recommendations: Continue to return yearly for your annual wellness and preventative care visits.  This gives [place] a chance to discuss healthy lifestyle, exercise, vaccinations, review your chart record, and perform screenings where appropriate.  I recommend you see your eye doctor yearly for routine vision care.  I recommend you see your dentist yearly for routine dental care including hygiene visits twice yearly.   Vaccination recommendations were reviewed Immunization History  Administered Date(s) Administered   Fluad Quad(high Dose 65+) 06/13/2019   Influenza Split 10/10/2013   Influenza, High Dose Seasonal PF 07/13/2017, 08/01/2018, 08/01/2018   Influenza,inj,Quad PF,6+ Mos 08/21/2014   Influenza-Unspecified 06/24/2016   Pneumococcal Conjugate-13 06/24/2016    Please get [place] a copy of your COVID-vaccine records  Shingles vaccine:  I recommend you have a shingles vaccine to help prevent shingles or herpes zoster outbreak.   Please call your insurer to inquire about coverage for the Shingrix vaccine given in 2 doses.   Some insurers cover this vaccine after age 61, some cover this after age 62.  If your insurer covers this, then call to schedule appointment to have this vaccine here.  Call insurance about coverage for tetanus booster as well.  Get a flu shot yearly in the fall   Screening for cancer: Colon cancer screening: I reviewed your 2018 colonoscopy report Call and schedule as you are due NOW  Skin cancer screening: Check your skin regularly for new changes, growing lesions, or other lesions of concern Come in for evaluation if you have skin lesions of concern. Follow up with your  dermatologist   Lung cancer screening: If you have a greater than 20 pack year history of tobacco use, then you may qualify for lung cancer screening with a chest CT scan.   Please call your insurance company to inquire about coverage for this test.  We currently don't have screenings for other cancers besides breast, cervical, colon, and lung cancers.  If you have a strong family history of cancer or have other cancer screening concerns, please let me know.    Bone health: Get at least 150 minutes of aerobic exercise weekly Get weight bearing exercise at least once weekly Bone density test:  A bone density test is an imaging test that uses a type of X-ray to measure the amount of calcium and other minerals in your bones. The test may be used to diagnose or screen you for a condition that causes weak or thin bones (osteoporosis), predict your risk for a broken bone (fracture), or determine how well your osteoporosis treatment is working. The bone density test is recommended for females 49 and older, or females or males [id number] if certain risk factors such as thyroid disease, long term use of steroids such as for asthma or rheumatological issues, vitamin D deficiency, estrogen deficiency, family history of osteoporosis, self or family history of fragility fracture in first degree relative.    Heart health: Get at least 150 minutes of aerobic exercise weekly Limit alcohol It is important to maintain a healthy blood pressure and healthy cholesterol numbers  Heart disease screening: Screening for heart disease includes screening for blood pressure, fasting lipids, glucose/diabetes screening, BMI height to weight ratio, reviewed of smoking  status, physical activity, and diet.    Goals include blood pressure 120/80 or less, maintaining a healthy lipid/cholesterol profile, preventing diabetes or keeping diabetes numbers under good control, not smoking or using tobacco products, exercising most days  per week or at least 150 minutes per week of exercise, and eating healthy variety of fruits and vegetables, healthy oils, and avoiding unhealthy food choices like fried food, fast food, high sugar and high cholesterol foods.    Other tests may possibly include EKG test, CT coronary calcium score, echocardiogram, exercise treadmill stress test.    Medical care options: I recommend you continue to seek care here first for routine care.  We try really hard to have available appointments Monday through Friday daytime hours for sick visits, acute visits, and physicals.  Urgent care should be used for after hours and weekends for significant issues that cannot wait till the next day.  The emergency department should be used for significant potentially life-threatening emergencies.  The emergency department is expensive, can often have long wait times for less significant concerns, so try to utilize primary care, urgent care, or telemedicine when possible to avoid unnecessary trips to the emergency department.  Virtual visits and telemedicine have been introduced since the pandemic started in 2020, and can be convenient ways to receive medical care.  We offer virtual appointments as well to assist you in a variety of options to seek medical care.    Advanced Directives: I recommend you consider completing a [place] and Living Will.   These documents respect your wishes and help alleviate burdens on your loved ones if you were to become terminally ill or be in a position to need those documents enforced.    You can complete Advanced Directives yourself, have them notarized, then have copies made for our office, for you and for anybody you feel should have them in safe keeping.  Or, you can have an attorney prepare these documents.   If you haven't updated your Last Will and Testament in a while, it may be worthwhile having an attorney prepare these documents together and save on some  costs.      Separate significant issues discussed: High cholesterol, atherosclerosis-continue current medication.  We will update labs today.  Keep in mind that smoking makes it easier for cholesterol to take in the arteries.  You already has known atherosclerosis and buildup in arteries.  The recommendation is low-cholesterol diet, continue cholesterol medicine and stop smoking.  Tobacco use-I strongly recommend you quit smoking to reduce your risk and cancer, worsening atherosclerosis and other complications such as COPD  Peripheral vascular disease-the recommendations are the same as above, continue cholesterol medicine, quit smoking, and exercise regularly.  I reviewed April 2022 office notes of vascular surgery/cardiology.  At that time he had stable mild bilateral leg claudication.  He was advised smoking cessation, continue statin.  He is advised yearly follow-up  Impaired glucose, prediabetes-updated labs today.  I recommend you eat a healthy low-cholesterol and low sugar diet.  Exercise most days per week  Vitamin D screening today  Your white blood cell count has been elevated in the past.  I am rechecking your labs today.

## 2021-05-28 NOTE — Telephone Encounter (Signed)
Noted in chart.

## 2021-05-28 NOTE — Telephone Encounter (Signed)
Pt called back and states that he had the [place] vaccine on 01/18/2020  02/11/2020  09/19/2020

## 2021-05-28 NOTE — Progress Notes (Signed)
Subjective:    Brent Espinoza is a 70 y.o. male who presents for Preventative Services visit and chronic medical problems/med check visit.    Primary Care Provider Tysinger, Camelia Eng, PA-C here for primary care  Current Health Care Team: Dentist, Dr. Gilford Rile- now retired Eye doctor, Dr. Deloria Lair Dr. Bethany Cellar, GI Dr. Allyn Kenner, dermatology Dr. Kathlyn [place], Dr. Jenkins Rouge, cardiovascular   Medical Services you may have received from other than [facility] providers in the past year (date may be approximate) Dr. Nevada Crane- Dermatology  Exercise Current exercise habits:  3-5 times a week    Nutrition/Diet Current diet: in general, a "healthy" diet    Depression Screen Depression screen [facility] 2/9 05/28/2021  Decreased Interest 0  Down, Depressed, Hopeless 0  PHQ - 2 Score 0    Activities of Daily Living Screen/Functional Status Survey Is the patient deaf or have difficulty hearing?: No Does the patient have difficulty seeing, even when wearing glasses/contacts?: No Does the patient have difficulty concentrating, remembering, or making decisions?: No Does the patient have difficulty walking or climbing stairs?: No Does the patient have difficulty dressing or bathing?: No Does the patient have difficulty doing errands alone such as visiting a doctor's office or shopping?: No  Can patient draw a clock face showing 3:15 oclock, yes  Fall Risk Screen Fall Risk  05/28/2021 07/19/2019 02/22/2018 02/18/2017  Falls in the past year? 0 0 No No  Number falls in past yr: 0 - - -  Injury with Fall? 0 - - -  Risk for fall due to : No Fall Risks - - -  Follow up Falls evaluation completed - - -    Gait Assessment: Normal gait observed yes  Advanced directives Does patient have a [place]? Yes Does patient have a Living Will? No  Past Medical History:  Diagnosis Date   Abnormal CT of the chest 06/08/2018   Allergy    mild   Atherosclerosis of aorta ([place])  06/08/2018   Cataract    removed right eye   Coronary artery disease involving native heart without angina pectoris 06/08/2018   Erectile dysfunction 06/08/2018   Eye disease 02/18/2017   GERD (gastroesophageal reflux disease)    occ   HDL deficiency 02/19/2017   Hyperlipidemia    Impacted cerumen of right ear 02/18/2017   Leukocytosis 02/19/2017   Mixed dyslipidemia 02/19/2017   Overweight 02/18/2017   Smoking    Tobacco use 02/18/2017    Past Surgical History:  Procedure Laterality Date   EYE SURGERY     gets therapy injections for vessel disease, hx/o cataract surgery   MOUTH SURGERY      Social History   Socioeconomic History   Marital status: Divorced    Spouse name: Not on file   Number of children: Not on file   Years of education: Not on file   Highest education level: Not on file  Occupational History   Not on file  Tobacco Use   Smoking status: Every Day    Packs/day: 1.00    Years: 50.00    Pack years: 50.00    Types: Cigarettes   Smokeless tobacco: Never  Vaping Use   Vaping Use: Never used  Substance and Sexual Activity   Alcohol use: Yes    Comment: occ   Drug use: No   Sexual activity: Not on file  Other Topics Concern   Not on file  Social History Narrative   Alone.  Retired as of 2018.   Was at [employer].   Exercise - yard work, Stage manager around.  Eats relatively healthy.   Has 2 children, 1 in [place], 1 in [place], 4 grandchildren.  02/2017   Social Determinants of Health   Financial Resource Strain: Not on file  Food Insecurity: Not on file  Transportation Needs: Not on file  Physical Activity: Not on file  Stress: Not on file  Social Connections: Not on file  Intimate Partner Violence: Not on file    Family History  Problem Relation Age of Onset   Other Mother        eye disease, died of old age   Other Father        health history unknown   Cancer Neg Hx    Diabetes Neg Hx    Heart disease Neg Hx    Stroke Neg Hx    Hyperlipidemia  Neg Hx    Hypertension Neg Hx    Colon cancer Neg Hx    Colon polyps Neg Hx    Rectal cancer Neg Hx    Stomach cancer Neg Hx      Current Outpatient Medications:    aspirin EC 81 MG tablet, Take 1 tablet (81 mg total) by mouth daily., Disp: 90 tablet, Rfl: 3   atorvastatin (LIPITOR) 40 MG tablet, Take 1 tablet (40 mg total) by mouth daily., Disp: 90 tablet, Rfl: 3   naproxen sodium (ALEVE) 220 MG tablet, Take 220 mg by mouth., Disp: , Rfl:   Allergies  Allergen Reactions   Penicillins     As child     History reviewed: allergies, current medications, past family history, past medical history, past social history, past surgical history and problem list  Chronic issues discussed: Hyperlipidemia-compliant with medication without complaint  He continues to smoke with no concerns or quitting  He sees his eye doctor regularly for multiple issues.  Acute issues discussed: None  Objective:     BP 130/80   Pulse 83   Ht '5\' 8"'$  (1.727 m)   Wt 170 lb (77.1 kg)   SpO2 98%   BMI 25.85 kg/m    Cognitive Testing  Alert? Yes  Normal Appearance?Yes  Oriented to person? Yes  Place? Yes   Time? Yes  Recall of three objects?  Yes  Can perform simple calculations? Yes  Displays appropriate judgment?Yes  Can read the correct time from a watch face?Yes  General appearance: alert, no distress, WD/WN, white male  Nutritional Status: Inadequate calore intake? no Loss of muscle mass? no Loss of fat beneath skin? no Localized or general edema? no Diminished functional status? no  Other pertinent exam: HEENT: normocephalic, sclerae anicteric, TMs pearly, nares patent, no discharge or erythema, pharynx normal Neck: supple, no lymphadenopathy, no thyromegaly, no masses Heart: RRR, normal S1, S2, no murmurs Lungs: CTA bilaterally, no wheezes, rhonchi, or rales Abdomen: +bs, soft, non tender, non distended, no masses, no hepatomegaly, no splenomegaly Musculoskeletal: nontender, no  swelling, no obvious deformity Extremities: no edema, no cyanosis, no clubbing Pulses: 2+ symmetric, upper and lower extremities, normal cap refill Neurological: alert, oriented x 3, CN2-12 intact, strength normal upper extremities and lower extremities, sensation normal throughout, DTRs 2+ throughout, no cerebellar signs, gait normal Psychiatric: normal affect, behavior normal, pleasant  GU deferred/declined Skin: Few scattered pink-red flat round lesions on face from recent dermatology cryotherapy  Assessment:   Encounter Diagnoses  Name Primary?   Encounter for health maintenance examination in adult Yes  Medicare annual wellness visit, subsequent    Leukocytosis, unspecified type    Vaccine counseling    Tobacco use    Screening for AAA (abdominal aortic aneurysm)    PAD (peripheral artery disease) (HCC)    Mixed dyslipidemia    Atherosclerosis of aorta (HCC)    Anxiety    Coronary artery disease involving native coronary artery of native heart without angina pectoris    Erectile dysfunction, unspecified erectile dysfunction type    Advance directive discussed with patient    Impaired fasting blood sugar    Vitamin deficiency    Screening for cancer      Plan:   A preventative services visit was completed today.  During the course of the visit today, we discussed and counseled about appropriate screening and preventive services.  A health risk assessment was established today that included a review of current medications, allergies, social history, family history, medical and preventative health history, biometrics, and preventative screenings to identify potential safety concerns or impairments.    This visit was a preventative care visit, also known as wellness visit or routine physical.   Topics typically include healthy lifestyle, diet, exercise, preventative care, vaccinations, sick and well care, proper use of emergency dept and after hours care, as well as other  concerns.     Recommendations: Continue to return yearly for your annual wellness and preventative care visits.  This gives [place] a chance to discuss healthy lifestyle, exercise, vaccinations, review your chart record, and perform screenings where appropriate.  I recommend you see your eye doctor yearly for routine vision care.  I recommend you see your dentist yearly for routine dental care including hygiene visits twice yearly.   Vaccination recommendations were reviewed Immunization History  Administered Date(s) Administered   Fluad Quad(high Dose 65+) 06/13/2019   Influenza Split 10/10/2013   Influenza, High Dose Seasonal PF 07/13/2017, 08/01/2018, 08/01/2018   Influenza,inj,Quad PF,6+ Mos 08/21/2014   Influenza-Unspecified 06/24/2016   Pneumococcal Conjugate-13 06/24/2016    Please get [place] a copy of your COVID-vaccine records  Shingles vaccine:  I recommend you have a shingles vaccine to help prevent shingles or herpes zoster outbreak.   Please call your insurer to inquire about coverage for the Shingrix vaccine given in 2 doses.   Some insurers cover this vaccine after age 74, some cover this after age 17.  If your insurer covers this, then call to schedule appointment to have this vaccine here.  Call insurance about coverage for tetanus booster as well.  Get a flu shot yearly in the fall   Screening for cancer: Colon cancer screening: I reviewed your 2018 colonoscopy report Call and schedule as you are due NOW  Skin cancer screening: Check your skin regularly for new changes, growing lesions, or other lesions of concern Come in for evaluation if you have skin lesions of concern. Follow up with your dermatologist   Lung cancer screening: If you have a greater than 20 pack year history of tobacco use, then you may qualify for lung cancer screening with a chest CT scan.   Please call your insurance company to inquire about coverage for this test.  We currently don't have  screenings for other cancers besides breast, cervical, colon, and lung cancers.  If you have a strong family history of cancer or have other cancer screening concerns, please let me know.    Bone health: Get at least 150 minutes of aerobic exercise weekly Get weight bearing exercise at least once weekly  Bone density test:  A bone density test is an imaging test that uses a type of X-ray to measure the amount of calcium and other minerals in your bones. The test may be used to diagnose or screen you for a condition that causes weak or thin bones (osteoporosis), predict your risk for a broken bone (fracture), or determine how well your osteoporosis treatment is working. The bone density test is recommended for females 65 and older, or females or males [id number] if certain risk factors such as thyroid disease, long term use of steroids such as for asthma or rheumatological issues, vitamin D deficiency, estrogen deficiency, family history of osteoporosis, self or family history of fragility fracture in first degree relative.    Heart health: Get at least 150 minutes of aerobic exercise weekly Limit alcohol It is important to maintain a healthy blood pressure and healthy cholesterol numbers  Heart disease screening: Screening for heart disease includes screening for blood pressure, fasting lipids, glucose/diabetes screening, BMI height to weight ratio, reviewed of smoking status, physical activity, and diet.    Goals include blood pressure 120/80 or less, maintaining a healthy lipid/cholesterol profile, preventing diabetes or keeping diabetes numbers under good control, not smoking or using tobacco products, exercising most days per week or at least 150 minutes per week of exercise, and eating healthy variety of fruits and vegetables, healthy oils, and avoiding unhealthy food choices like fried food, fast food, high sugar and high cholesterol foods.    Other tests may possibly include EKG test, CT  coronary calcium score, echocardiogram, exercise treadmill stress test.    Medical care options: I recommend you continue to seek care here first for routine care.  We try really hard to have available appointments Monday through Friday daytime hours for sick visits, acute visits, and physicals.  Urgent care should be used for after hours and weekends for significant issues that cannot wait till the next day.  The emergency department should be used for significant potentially life-threatening emergencies.  The emergency department is expensive, can often have long wait times for less significant concerns, so try to utilize primary care, urgent care, or telemedicine when possible to avoid unnecessary trips to the emergency department.  Virtual visits and telemedicine have been introduced since the pandemic started in 2020, and can be convenient ways to receive medical care.  We offer virtual appointments as well to assist you in a variety of options to seek medical care.    Advanced Directives: I recommend you consider completing a [place] and Living Will.   These documents respect your wishes and help alleviate burdens on your loved ones if you were to become terminally ill or be in a position to need those documents enforced.    You can complete Advanced Directives yourself, have them notarized, then have copies made for our office, for you and for anybody you feel should have them in safe keeping.  Or, you can have an attorney prepare these documents.   If you haven't updated your Last Will and Testament in a while, it may be worthwhile having an attorney prepare these documents together and save on some costs.      Separate significant issues discussed: High cholesterol, atherosclerosis-continue current medication.  We will update labs today.  Keep in mind that smoking makes it easier for cholesterol to take in the arteries.  You already has known atherosclerosis and  buildup in arteries.  The recommendation is low-cholesterol diet, continue cholesterol  medicine and stop smoking.  Tobacco use-I strongly recommend you quit smoking to reduce your risk and cancer, worsening atherosclerosis and other complications such as COPD  Peripheral vascular disease-the recommendations are the same as above, continue cholesterol medicine, quit smoking, and exercise regularly.  I reviewed April 2022 office notes of vascular surgery/cardiology.  At that time he had stable mild bilateral leg claudication.  He was advised smoking cessation, continue statin.  He is advised yearly follow-up  Impaired glucose, prediabetes-updated labs today.  I recommend you eat a healthy low-cholesterol and low sugar diet.  Exercise most days per week  Vitamin D screening today  Your white blood cell count has been elevated in the past.  I am rechecking your labs today.    Chandler was seen today for nonfasting cpe.  Diagnoses and all orders for this visit:  Encounter for health maintenance examination in adult -     Comprehensive metabolic panel -     CBC with Differential/Platelet -     Hemoglobin A1c -     VITAMIN D 25 Hydroxy (Vit-D Deficiency, Fractures) -     Lipid panel -     CT CHEST LUNG CA SCREEN LOW DOSE W/O CM; Future  Medicare annual wellness visit, subsequent  Leukocytosis, unspecified type  Vaccine counseling  Tobacco use -     CT CHEST LUNG CA SCREEN LOW DOSE W/O CM; Future  Screening for AAA (abdominal aortic aneurysm)  PAD (peripheral artery disease) (HCC)  Mixed dyslipidemia -     Lipid panel  Atherosclerosis of aorta (HCC) -     Lipid panel  Anxiety  Coronary artery disease involving native coronary artery of native heart without angina pectoris  Erectile dysfunction, unspecified erectile dysfunction type  Advance directive discussed with patient  Impaired fasting blood sugar -     Hemoglobin A1c  Vitamin deficiency -     VITAMIN D 25 Hydroxy  (Vit-D Deficiency, Fractures)  Screening for cancer -     CT CHEST LUNG CA SCREEN LOW DOSE W/O CM; Future    Medicare Attestation A preventative services visit was completed today.  During the course of the visit the patient was educated and counseled about appropriate screening and preventive services.  A health risk assessment was established with the patient that included a review of current medications, allergies, social history, family history, medical and preventative health history, biometrics, and preventative screenings to identify potential safety concerns or impairments.  A personalized plan was printed today for the patient's records and use.   Personalized health advice and education was given today to reduce health risks and promote self management and wellness.  Information regarding end of life planning was discussed today.  Dorothea Ogle, PA-C   05/28/2021

## 2021-05-29 ENCOUNTER — Other Ambulatory Visit: Payer: Self-pay | Admitting: Internal Medicine

## 2021-05-29 DIAGNOSIS — R7301 Impaired fasting glucose: Secondary | ICD-10-CM

## 2021-05-29 DIAGNOSIS — E782 Mixed hyperlipidemia: Secondary | ICD-10-CM

## 2021-05-29 LAB — CBC WITH DIFFERENTIAL/PLATELET
Basophils Absolute: 0.1 10*3/uL (ref 0.0–0.2)
Basos: 1 %
EOS (ABSOLUTE): 0.1 10*3/uL (ref 0.0–0.4)
Eos: 1 %
Hematocrit: 47.2 % (ref 37.5–51.0)
Hemoglobin: 16.4 g/dL (ref 13.0–17.7)
Immature Grans (Abs): 0 10*3/uL (ref 0.0–0.1)
Immature Granulocytes: 0 %
Lymphocytes Absolute: 4.7 10*3/uL — ABNORMAL HIGH (ref 0.7–3.1)
Lymphs: 33 %
MCH: 29.9 pg (ref 26.6–33.0)
MCHC: 34.7 g/dL (ref 31.5–35.7)
MCV: 86 fL (ref 79–97)
Monocytes Absolute: 0.9 10*3/uL (ref 0.1–0.9)
Monocytes: 6 %
Neutrophils Absolute: 8.4 10*3/uL — ABNORMAL HIGH (ref 1.4–7.0)
Neutrophils: 59 %
Platelets: 323 10*3/uL (ref 150–450)
RBC: 5.48 x10E6/uL (ref 4.14–5.80)
RDW: 12.5 % (ref 11.6–15.4)
WBC: 14.3 10*3/uL — ABNORMAL HIGH (ref 3.4–10.8)

## 2021-05-29 LAB — COMPREHENSIVE METABOLIC PANEL
ALT: 18 IU/L (ref 0–44)
AST: 16 IU/L (ref 0–40)
Albumin/Globulin Ratio: 1.5 (ref 1.2–2.2)
Albumin: 4.5 g/dL (ref 3.7–4.7)
Alkaline Phosphatase: 114 IU/L (ref 44–121)
BUN/Creatinine Ratio: 16 (ref 10–24)
BUN: 14 mg/dL (ref 8–27)
Bilirubin Total: 0.4 mg/dL (ref 0.0–1.2)
CO2: 24 mmol/L (ref 20–29)
Calcium: 9.8 mg/dL (ref 8.6–10.2)
Chloride: 101 mmol/L (ref 96–106)
Creatinine, Ser: 0.88 mg/dL (ref 0.76–1.27)
Globulin, Total: 3.1 g/dL (ref 1.5–4.5)
Glucose: 110 mg/dL — ABNORMAL HIGH (ref 65–99)
Potassium: 4.2 mmol/L (ref 3.5–5.2)
Sodium: 139 mmol/L (ref 134–144)
Total Protein: 7.6 g/dL (ref 6.0–8.5)
eGFR: 92 mL/min/{1.73_m2} (ref >59)

## 2021-05-29 LAB — LIPID PANEL
Chol/HDL Ratio: 3.6 ratio (ref 0.0–5.0)
Cholesterol, Total: 94 mg/dL — ABNORMAL LOW (ref 100–199)
HDL: 26 mg/dL — ABNORMAL LOW (ref >39)
LDL Chol Calc (NIH): 52 mg/dL (ref 0–99)
Triglycerides: 73 mg/dL (ref 0–149)
VLDL Cholesterol Cal: 16 mg/dL (ref 5–40)

## 2021-05-29 LAB — HEMOGLOBIN A1C
Est. average glucose Bld gHb Est-mCnc: 134 mg/dL
Hgb A1c MFr Bld: 6.3 % — ABNORMAL HIGH (ref 4.8–5.6)

## 2021-05-29 LAB — VITAMIN D 25 HYDROXY (VIT D DEFICIENCY, FRACTURES): Vit D, 25-Hydroxy: 42.2 ng/mL (ref 30.0–100.0)

## 2021-05-30 ENCOUNTER — Other Ambulatory Visit: Payer: Self-pay | Admitting: Medical

## 2021-05-30 MED ORDER — ASPIRIN EC 81 MG PO TBEC: 81.0000 mg | DELAYED_RELEASE_TABLET | Freq: Every day | ORAL | 3 refills | Status: DC

## 2021-05-30 MED ORDER — METFORMIN HCL 500 MG PO TABS: 500.0000 mg | ORAL_TABLET | Freq: Every day | ORAL | 0 refills | Status: DC

## 2021-06-11 ENCOUNTER — Encounter: Payer: Self-pay | Admitting: Gastroenterology

## 2021-06-19 ENCOUNTER — Ambulatory Visit
Admission: RE | Admit: 2021-06-19 | Discharge: 2021-06-19 | Disposition: A | Discharge status: Home / Self Care | Payer: Medicare HMO | Source: Ambulatory Visit | Attending: Medical | Admitting: Medical

## 2021-06-19 DIAGNOSIS — Z129 Encounter for screening for malignant neoplasm, site unspecified: Secondary | ICD-10-CM

## 2021-06-19 DIAGNOSIS — Z0000 Encounter for general adult medical examination without abnormal findings: Secondary | ICD-10-CM

## 2021-06-19 DIAGNOSIS — F17210 Nicotine dependence, cigarettes, uncomplicated: Secondary | ICD-10-CM | POA: Diagnosis not present

## 2021-06-19 DIAGNOSIS — Z720 Tobacco use: Secondary | ICD-10-CM

## 2021-06-24 DIAGNOSIS — X32XXXD Exposure to sunlight, subsequent encounter: Secondary | ICD-10-CM | POA: Diagnosis not present

## 2021-06-24 DIAGNOSIS — Z85828 Personal history of other malignant neoplasm of skin: Secondary | ICD-10-CM | POA: Diagnosis not present

## 2021-06-24 DIAGNOSIS — L218 Other seborrheic dermatitis: Secondary | ICD-10-CM | POA: Diagnosis not present

## 2021-06-24 DIAGNOSIS — L570 Actinic keratosis: Secondary | ICD-10-CM | POA: Diagnosis not present

## 2021-06-24 DIAGNOSIS — Z08 Encounter for follow-up examination after completed treatment for malignant neoplasm: Secondary | ICD-10-CM | POA: Diagnosis not present

## 2021-06-30 ENCOUNTER — Telehealth: Payer: Self-pay | Admitting: Medical

## 2021-06-30 NOTE — Telephone Encounter (Signed)
Pt came in and a cope of his COVID vaccine care was made. Sending back to provider.

## 2021-07-12 HISTORY — PX: COLONOSCOPY: SHX174

## 2021-07-17 ENCOUNTER — Ambulatory Visit (AMBULATORY_SURGERY_CENTER): Payer: Medicare HMO | Admitting: *Deleted

## 2021-07-17 ENCOUNTER — Other Ambulatory Visit: Payer: Self-pay

## 2021-07-17 VITALS — Ht 68.0 in | Wt 170.0 lb

## 2021-07-17 DIAGNOSIS — Z86010 Personal history of colonic polyps: Secondary | ICD-10-CM

## 2021-07-17 MED ORDER — NA SULFATE-K SULFATE-MG SULF 17.5-3.13-1.6 GM/177ML PO SOLN: 1.0000 | Freq: Once | ORAL | 0 refills | Status: AC

## 2021-07-17 NOTE — Progress Notes (Signed)
Pt's previsit is done over the phone and all paperwork (prep instructions, blank consent form to just read over) sent to patient.  Pt's name and DOB verified at the beginning of the previsit.  Pt denies any difficulty with ambulating.     No trouble with anesthesia, denies trouble moving neck or hx/fam hx of malignant hyperthermia per pt  Instructions sent via MyChart and Mailed    No egg or soy allergy  No home oxygen use   No medications for weight loss taken  Pt denies constipation issues  Pt informed that we do not do prior authorizations for prep Reviewed the instructions several times with pt

## 2021-07-18 ENCOUNTER — Encounter: Payer: Self-pay | Admitting: Gastroenterology

## 2021-07-28 ENCOUNTER — Encounter (INDEPENDENT_AMBULATORY_CARE_PROVIDER_SITE_OTHER): Payer: Medicare HMO | Admitting: Ophthalmology

## 2021-07-30 ENCOUNTER — Encounter: Payer: Self-pay | Admitting: Gastroenterology

## 2021-07-30 ENCOUNTER — Ambulatory Visit (AMBULATORY_SURGERY_CENTER): Payer: Medicare HMO | Admitting: Gastroenterology

## 2021-07-30 ENCOUNTER — Other Ambulatory Visit: Payer: Self-pay

## 2021-07-30 VITALS — BP 116/70 | HR 76 | Temp 97.8°F | Resp 16 | Ht 68.0 in | Wt 170.0 lb

## 2021-07-30 DIAGNOSIS — D127 Benign neoplasm of rectosigmoid junction: Secondary | ICD-10-CM | POA: Diagnosis not present

## 2021-07-30 DIAGNOSIS — D125 Benign neoplasm of sigmoid colon: Secondary | ICD-10-CM | POA: Diagnosis not present

## 2021-07-30 DIAGNOSIS — D123 Benign neoplasm of transverse colon: Secondary | ICD-10-CM | POA: Diagnosis not present

## 2021-07-30 DIAGNOSIS — D122 Benign neoplasm of ascending colon: Secondary | ICD-10-CM

## 2021-07-30 DIAGNOSIS — Z86010 Personal history of colonic polyps: Secondary | ICD-10-CM | POA: Diagnosis not present

## 2021-07-30 DIAGNOSIS — Z720 Tobacco use: Secondary | ICD-10-CM | POA: Diagnosis not present

## 2021-07-30 DIAGNOSIS — I2510 Atherosclerotic heart disease of native coronary artery without angina pectoris: Secondary | ICD-10-CM | POA: Diagnosis not present

## 2021-07-30 MED ORDER — SODIUM CHLORIDE 0.9 % IV SOLN: 500.0000 mL | Freq: Once | INTRAVENOUS | Status: DC

## 2021-07-30 NOTE — Progress Notes (Signed)
VS taken by C.W. 

## 2021-07-30 NOTE — Patient Instructions (Signed)
Await pathology  Please read over handouts about polyps, hemorrhoids and diverticulosis  Continue your normal medications  YOU HAD AN ENDOSCOPIC PROCEDURE TODAY AT [facility]:   Refer to the procedure report that was given to you for any specific questions about what was found during the examination.  If the procedure report does not answer your questions, please call your gastroenterologist to clarify.  If you requested that your care partner not be given the details of your procedure findings, then the procedure report has been included in a sealed envelope for you to review at your convenience later.  YOU SHOULD EXPECT: Some feelings of bloating in the abdomen. Passage of more gas than usual.  Walking can help get rid of the air that was put into your GI tract during the procedure and reduce the bloating. If you had a lower endoscopy (such as a colonoscopy or flexible sigmoidoscopy) you may notice spotting of blood in your stool or on the toilet paper. If you underwent a bowel prep for your procedure, you may not have a normal bowel movement for a few days.  Please Note:  You might notice some irritation and congestion in your nose or some drainage.  This is from the oxygen used during your procedure.  There is no need for concern and it should clear up in a day or so.  SYMPTOMS TO REPORT IMMEDIATELY:  Following lower endoscopy (colonoscopy or flexible sigmoidoscopy):  Excessive amounts of blood in the stool  Significant tenderness or worsening of abdominal pains  Swelling of the abdomen that is new, acute  Fever of 100F or higher  For urgent or emergent issues, a gastroenterologist can be reached at any hour by calling [phone]. Do not use MyChart messaging for urgent concerns.    DIET:  We do recommend a small meal at first, but then you may proceed to your regular diet.  Drink plenty of fluids but you should avoid alcoholic beverages for 24 hours.  ACTIVITY:   You should plan to take it easy for the rest of today and you should NOT DRIVE or use heavy machinery until tomorrow (because of the sedation medicines used during the test).    FOLLOW UP: Our staff will call the number listed on your records 48-72 hours following your procedure to check on you and address any questions or concerns that you may have regarding the information given to you following your procedure. If we do not reach you, we will leave a message.  We will attempt to reach you two times.  During this call, we will ask if you have developed any symptoms of COVID 19. If you develop any symptoms (ie: fever, flu-like symptoms, shortness of breath, cough etc.) before then, please call [phone].  If you test positive for Covid 19 in the 2 weeks post procedure, please call and report this information to [place].    If any biopsies were taken you will be contacted by phone or by letter within the next 1-3 weeks.  Please call us at [phone] if you have not heard about the biopsies in 3 weeks.    SIGNATURES/CONFIDENTIALITY: You and/or your care partner have signed paperwork which will be entered into your electronic medical record.  These signatures attest to the fact that that the information above on your After Visit Summary has been reviewed and is understood.  Full responsibility of the confidentiality of this discharge information lies with you and/or your care-partner.

## 2021-07-30 NOTE — Progress Notes (Signed)
Called to room to assist during endoscopic procedure.  Patient ID and intended procedure confirmed with present staff. Received instructions for my participation in the procedure from the performing physician.  

## 2021-07-30 NOTE — Progress Notes (Signed)
[facility] History and Physical   Primary Care Physician:  Carlena Hurl, PA-C   Reason for Procedure:   History of colon polyps  Plan:    colonoscopy     HPI: Brent Espinoza is a 70 y.o. male  here for colonoscopy surveillance. He had a colonoscopy 03/2018 - multiple polyps removed including 25mm cecal polyp - most SSPs. Patient denies any bowel symptoms at this time. No family history of colon cancer known. Otherwise feels well without any cardiopulmonary symptoms.   I have discussed risks / benefits of colonoscopy and anesthesia with him and he wishes to proceed.    Past Medical History:  Diagnosis Date   Abnormal CT of the chest 06/08/2018   Allergy    mild   Arthritis    Atherosclerosis of aorta ([place]) 06/08/2018   Cancer ([place]) 2022   skin cancer   Cataract    removed right eye   Coronary artery disease involving native heart without angina pectoris 06/08/2018   Diabetes mellitus without complication ([place])    "pre diabetes"   Erectile dysfunction 06/08/2018   Eye disease 02/18/2017   GERD (gastroesophageal reflux disease)    occ   HDL deficiency 02/19/2017   Hyperlipidemia    Impacted cerumen of right ear 02/18/2017   Leukocytosis 02/19/2017   Mixed dyslipidemia 02/19/2017   Overweight 02/18/2017   Smoking    Tobacco use 02/18/2017    Past Surgical History:  Procedure Laterality Date   COLONOSCOPY     EYE SURGERY     gets therapy injections for vessel disease, hx/o cataract surgery   MOUTH SURGERY      Prior to Admission medications   Medication Sig Start Date End Date Taking? Authorizing Provider  aspirin EC 81 MG tablet Take 1 tablet (81 mg total) by mouth daily. 05/30/21  Yes Tysinger, Camelia Eng, PA-C  atorvastatin (LIPITOR) 40 MG tablet Take 1 tablet (40 mg total) by mouth daily. 12/06/20  Yes Wellington Hampshire, MD  metFORMIN (GLUCOPHAGE) 500 MG tablet Take 1 tablet (500 mg total) by mouth daily with breakfast. 05/30/21  Yes Tysinger,  Camelia Eng, PA-C  naproxen sodium (ALEVE) 220 MG tablet Take 220 mg by mouth. Takes PRN   Yes [provider]    Current Outpatient Medications  Medication Sig Dispense Refill   aspirin EC 81 MG tablet Take 1 tablet (81 mg total) by mouth daily. 90 tablet 3   atorvastatin (LIPITOR) 40 MG tablet Take 1 tablet (40 mg total) by mouth daily. 90 tablet 3   metFORMIN (GLUCOPHAGE) 500 MG tablet Take 1 tablet (500 mg total) by mouth daily with breakfast. 90 tablet 0   naproxen sodium (ALEVE) 220 MG tablet Take 220 mg by mouth. Takes PRN     Current Facility-Administered Medications  Medication Dose Route Frequency Provider Last Rate Last Admin   0.9 %  sodium chloride infusion  500 mL Intravenous Once Phillp Dolores, Carlota Raspberry, MD        Allergies as of 07/30/2021 - Review Complete 07/30/2021  Allergen Reaction Noted   Penicillins  05/17/2012    Family History  Problem Relation Age of Onset   Other Mother        eye disease, died of old age   Other Father        health history unknown   Cancer Neg Hx    Diabetes Neg Hx    Heart disease Neg Hx    Stroke Neg Hx  Hyperlipidemia Neg Hx    Hypertension Neg Hx    Colon cancer Neg Hx    Colon polyps Neg Hx    Rectal cancer Neg Hx    Stomach cancer Neg Hx     Social History   Socioeconomic History   Marital status: Divorced    Spouse name: Not on file   Number of children: Not on file   Years of education: Not on file   Highest education level: Not on file  Occupational History   Not on file  Tobacco Use   Smoking status: Every Day    Packs/day: 1.00    Years: 50.00    Pack years: 50.00    Types: Cigarettes   Smokeless tobacco: Never  Vaping Use   Vaping Use: Never used  Substance and Sexual Activity   Alcohol use: Yes    Comment: occ   Drug use: No   Sexual activity: Not on file  Other Topics Concern   Not on file  Social History Narrative   Alone.   Retired as of 2018.   Was at [employer].   Exercise - yard work,  Stage manager around.  Eats relatively healthy.   Has 2 children, 1 in [place], 1 in [place], 4 grandchildren.  02/2017   Social Determinants of Health   Financial Resource Strain: Not on file  Food Insecurity: Not on file  Transportation Needs: Not on file  Physical Activity: Not on file  Stress: Not on file  Social Connections: Not on file  Intimate Partner Violence: Not on file    Review of Systems: All other review of systems negative except as mentioned in the HPI.  Physical Exam: Vital signs BP (!) 159/85   Pulse 77   Temp 97.8 F (36.6 C)   Resp 15   Ht 5\' 8"  (1.727 m)   Wt 170 lb (77.1 kg)   SpO2 97%   BMI 25.85 kg/m   General:   Alert,  Well-developed,  pleasant and cooperative in NAD Lungs:  Clear throughout to auscultation.   Heart:  Regular rate and rhythm Abdomen:  Soft, nontender and nondistended.   Neuro/Psych:  Alert and cooperative. Normal mood and affect. A and O x 3  Jolly Mango, MD [facility]

## 2021-07-30 NOTE — Progress Notes (Signed)
To PACU, VSS. Report to [email or web address] 

## 2021-07-30 NOTE — Progress Notes (Signed)
Pt's states no medical or surgical changes since previsit or office visit. 

## 2021-07-30 NOTE — Op Note (Signed)
[place] Patient Name: Brent Espinoza Procedure Date: 07/30/2021 10:50 AM MRN: [id number] Endoscopist: Remo Lipps P. Havery Moros , MD Age: 70 Referring MD:  Date of Birth: 03/06/1951 Gender: Male Account #: [id number] Procedure:                Colonoscopy Indications:              High risk colon cancer surveillance: Personal                            history of colonic polyps - last exam 03/2018 - 8                            polyps, most sessile serrated, largest 65mm in the                            cecum Medicines:                Monitored Anesthesia Care Procedure:                Pre-Anesthesia Assessment:                           - Prior to the procedure, a History and Physical                            was performed, and patient medications and                            allergies were reviewed. The patient's tolerance of                            previous anesthesia was also reviewed. The risks                            and benefits of the procedure and the sedation                            options and risks were discussed with the patient.                            All questions were answered, and informed consent                            was obtained. Prior Anticoagulants: The patient has                            taken no previous anticoagulant or antiplatelet                            agents. ASA Grade Assessment: II - A patient with                            mild systemic disease. After reviewing the risks  and benefits, the patient was deemed in                            satisfactory condition to undergo the procedure.                           After obtaining informed consent, the colonoscope                            was passed under direct vision. Throughout the                            procedure, the patient's blood pressure, pulse, and                            oxygen saturations were monitored continuously. The                             Colonoscope was introduced through the anus and                            advanced to the the cecum, identified by                            appendiceal orifice and ileocecal valve. The                            colonoscopy was performed without difficulty. The                            patient tolerated the procedure well. The quality                            of the bowel preparation was adequate. The                            ileocecal valve, appendiceal orifice, and rectum                            were photographed. Scope In: 11:01:28 AM Scope Out: 11:24:54 AM Scope Withdrawal Time: 0 hours 21 minutes 3 seconds  Total Procedure Duration: 0 hours 23 minutes 26 seconds  Findings:                 The perianal and digital rectal examinations were                            normal.                           A single small angiodysplastic lesion was found in                            the cecum.  A 4 mm polyp was found in the ascending colon. The                            polyp was sessile. The polyp was removed with a                            cold snare. Resection and retrieval were complete.                           A 3 mm polyp was found in the hepatic flexure. The                            polyp was sessile. The polyp was removed with a                            cold snare. Resection and retrieval were complete.                           Multiple flat polyps noted in the sigmoid and                            rectosigmoid colon, suspect benign hyperplastic                            polyps. Three representative flat polyps in the                            sigmoid colon, 3 to 4 mm in size, were removed with                            a cold snare. Resection and retrieval were complete.                           Two representative flatpolyps were found in the                            recto-sigmoid colon, 3 to 5 mm in  size, were                            removed with a cold snare. Resection and retrieval                            were complete.                           Multiple small-mouthed diverticula were found in                            the sigmoid colon and ascending colon.                           Internal hemorrhoids were found during retroflexion.  The exam was otherwise without abnormality. Complications:            No immediate complications. Estimated blood loss:                            Minimal. Estimated Blood Loss:     Estimated blood loss was minimal. Impression:               - A single colonic angiodysplastic lesion.                           - One 4 mm polyp in the ascending colon, removed                            with a cold snare. Resected and retrieved.                           - One 3 mm polyp at the hepatic flexure, removed                            with a cold snare. Resected and retrieved.                           - Multiple suspected benign hyperplastic left sided                            polyps - representative samples (largest polyps)                            removed as outlined above. Resected and retrieved.                           - Diverticulosis in the sigmoid colon and in the                            ascending colon.                           - Internal hemorrhoids.                           - The examination was otherwise normal. Recommendation:           - Patient has a contact number available for                            emergencies. The signs and symptoms of potential                            delayed complications were discussed with the                            patient. Return to normal activities tomorrow.                            Written discharge instructions were  provided to the                            patient.                           - Resume previous diet.                           - Continue  present medications.                           - Await pathology results. Remo Lipps P. Azalyn Sliwa, MD 07/30/2021 11:32:55 AM This report has been signed electronically.

## 2021-07-31 ENCOUNTER — Encounter (INDEPENDENT_AMBULATORY_CARE_PROVIDER_SITE_OTHER): Payer: Self-pay | Admitting: Ophthalmology

## 2021-07-31 ENCOUNTER — Ambulatory Visit (INDEPENDENT_AMBULATORY_CARE_PROVIDER_SITE_OTHER): Payer: Medicare HMO | Admitting: Ophthalmology

## 2021-07-31 DIAGNOSIS — H35371 Puckering of macula, right eye: Secondary | ICD-10-CM

## 2021-07-31 DIAGNOSIS — H34811 Central retinal vein occlusion, right eye, with macular edema: Secondary | ICD-10-CM

## 2021-07-31 MED ORDER — AFLIBERCEPT 2MG/0.05ML IZ SOLN FOR KALEIDOSCOPE
2.0000 mg | INTRAVITREAL | Status: AC | PRN
  Administered 2021-07-31: 2 mg via INTRAVITREAL

## 2021-07-31 NOTE — Assessment & Plan Note (Signed)
Chronic active CME yet remains overall stable at 9-week interval post Eylea injection.  Resistant to all other medications  Minor epiretinal membrane exist but vision is limited by central geographic atrophy thus will observe

## 2021-07-31 NOTE — Progress Notes (Signed)
07/31/2021     CHIEF COMPLAINT Patient presents for  Chief Complaint  Patient presents with   Retina Follow Up      HISTORY OF PRESENT ILLNESS: Brent Espinoza is a 70 y.o. male who presents to the clinic today for:   HPI     Retina Follow Up   Patient presents with  CRVO/BRVO.  In right eye.  This started 9 weeks ago.  Severity is mild.  Duration of 9 weeks.  Since onset it is stable.        Comments   9 week fu ou and oct/ eylea od Pt states VA OU stable since last visit. Pt denies FOL, floaters, or ocular pain OU.  A1C: does not remember LBS: does not check        Last edited by Kendra Opitz, COA on 07/31/2021  8:38 AM.      Referring physician: Carlena Hurl, PA-C [place],  [place] [address]  HISTORICAL INFORMATION:   Selected notes from the MEDICAL RECORD NUMBER    Lab Results  Component Value Date   HGBA1C 6.3 (H) 05/28/2021     CURRENT MEDICATIONS: No current outpatient medications on file. (Ophthalmic Drugs)   No current facility-administered medications for this visit. (Ophthalmic Drugs)   Current Outpatient Medications (Other)  Medication Sig   aspirin EC 81 MG tablet Take 1 tablet (81 mg total) by mouth daily.   atorvastatin (LIPITOR) 40 MG tablet Take 1 tablet (40 mg total) by mouth daily.   metFORMIN (GLUCOPHAGE) 500 MG tablet Take 1 tablet (500 mg total) by mouth daily with breakfast.   naproxen sodium (ALEVE) 220 MG tablet Take 220 mg by mouth. Takes PRN   No current facility-administered medications for this visit. (Other)      REVIEW OF SYSTEMS:    ALLERGIES Allergies  Allergen Reactions   Penicillins     As child     PAST MEDICAL HISTORY Past Medical History:  Diagnosis Date   Abnormal CT of the chest 06/08/2018   Allergy    mild   Arthritis    Atherosclerosis of aorta ([place]) 06/08/2018   Cancer ([place]) 2022   skin cancer   Cataract    removed right eye   Coronary artery disease  involving native heart without angina pectoris 06/08/2018   Diabetes mellitus without complication ([place])    "pre diabetes"   Erectile dysfunction 06/08/2018   Eye disease 02/18/2017   GERD (gastroesophageal reflux disease)    occ   HDL deficiency 02/19/2017   Hyperlipidemia    Impacted cerumen of right ear 02/18/2017   Leukocytosis 02/19/2017   Mixed dyslipidemia 02/19/2017   Overweight 02/18/2017   Smoking    Tobacco use 02/18/2017   Past Surgical History:  Procedure Laterality Date   COLONOSCOPY     EYE SURGERY     gets therapy injections for vessel disease, hx/o cataract surgery   MOUTH SURGERY      FAMILY HISTORY Family History  Problem Relation Age of Onset   Other Mother        eye disease, died of old age   Other Father        health history unknown   Cancer Neg Hx    Diabetes Neg Hx    Heart disease Neg Hx    Stroke Neg Hx    Hyperlipidemia Neg Hx    Hypertension Neg Hx    Colon cancer Neg Hx    Colon  polyps Neg Hx    Rectal cancer Neg Hx    Stomach cancer Neg Hx     SOCIAL HISTORY Social History   Tobacco Use   Smoking status: Every Day    Packs/day: 1.00    Years: 50.00    Pack years: 50.00    Types: Cigarettes   Smokeless tobacco: Never  Vaping Use   Vaping Use: Never used  Substance Use Topics   Alcohol use: Yes    Comment: occ   Drug use: No         OPHTHALMIC EXAM:  Base Eye Exam     Visual Acuity (ETDRS)       Right Left   Dist cc 20/400 20/20   Dist ph cc NI     Correction: Glasses         Tonometry (Tonopen, 8:42 AM)       Right Left   Pressure 19 20         Pupils       Pupils Dark Light Shape React APD   Right PERRL 4 3 Round Brisk None   Left PERRL 4 3 Round Brisk None         Visual Fields (Counting fingers)       Left Right    Full Full         Extraocular Movement       Right Left    Full Full         Neuro/Psych     Oriented x3: Yes   Mood/Affect: Normal         Dilation      Right eye: 1.0% Mydriacyl, 2.5% Phenylephrine @ 8:42 AM           Slit Lamp and Fundus Exam     External Exam       Right Left   External Normal Normal         Slit Lamp Exam       Right Left   Lids/Lashes Normal Normal   Conjunctiva/Sclera White and quiet White and quiet   Cornea Clear Clear   Anterior Chamber Deep and quiet Deep and quiet   Iris Round and reactive Round and reactive   Lens Posterior chamber intraocular lens 2+ Nuclear sclerosis   Anterior Vitreous Normal Normal         Fundus Exam       Right Left   Posterior Vitreous Vitreal papillary traction    Disc Collateralization on the nerve, no NVD    C/D Ratio 0.0    Macula Geographic atrophy, Macular thickening, Cystoid macular edema, Microaneurysms, Epiretinal membrane    Vessels Old central retinal vein occlusion, ischemic at present.    Periphery Normal, good PRP 360             IMAGING AND PROCEDURES  Imaging and Procedures for 07/31/21  OCT, Retina - OU - Both Eyes       Right Eye Quality was good. Scan locations included subfoveal. Central Foveal Thickness: 220. Findings include abnormal foveal contour, outer retinal atrophy, central retinal atrophy, epiretinal membrane.   Left Eye Quality was good. Scan locations included subfoveal. Central Foveal Thickness: 309. Progression has been stable. Findings include normal foveal contour, vitreomacular adhesion .   Notes OD persistent foveal atrophy from longstanding chronic recurrent CME from [facility].  Less CME OD at 9-week follow-up today and stable, thus viable macular retina remains as it is  responsive to treatment with [facility]  currently at 9-week follow-up today we will repeat injection today and examination again around 9 weeks     Intravitreal Injection, Pharmacologic Agent - OD - Right Eye       Time Out 07/31/2021. 9:08 AM. Confirmed correct patient, procedure, site, and patient consented.   Anesthesia Topical anesthesia  was used. Anesthetic medications included Akten 3.5%.   Procedure Preparation included Ofloxacin , 5% betadine to ocular surface, 10% betadine to eyelids. A 30 gauge needle was used.   Injection: 2 mg aflibercept 2 MG/0.05ML   Route: Intravitreal, Site: Right Eye   NDC: [id number], Lot: [id number], Waste: 0 mL   Post-op Post injection exam found visual acuity of at least counting fingers. The patient tolerated the procedure well. There were no complications. The patient received written and verbal post procedure care education. Post injection medications included gentamicin.              ASSESSMENT/PLAN:  Central retinal vein occlusion with macular edema of right eye Chronic active CME yet remains overall stable at 9-week interval post Eylea injection.  Resistant to all other medications  Minor epiretinal membrane exist but vision is limited by central geographic atrophy thus will observe  Macular pucker, right eye OD not the limiting factor of acuity     ICD-10-CM   1. Central retinal vein occlusion with macular edema of right eye  H34.8110 OCT, Retina - OU - Both Eyes    Intravitreal Injection, Pharmacologic Agent - OD - Right Eye    aflibercept (EYLEA) SOLN 2 mg    2. Macular pucker, right eye  H35.371       1.  OD, vastly improved overall yet still stable active CME from [place] [facility].  Acuity limited by central geographic RPE , outer retinal atrophy, will repeat examinations in the 9 weeks and follow-up again likely injection again to maintain  2.  3.  Ophthalmic Meds Ordered this visit:  Meds ordered this encounter  Medications   aflibercept (EYLEA) SOLN 2 mg       Return in about 9 weeks (around 10/02/2021) for dilate, OD, EYLEA OCT.  There are no Patient Instructions on file for this visit.   Explained the diagnoses, plan, and follow up with the patient and they expressed understanding.  Patient expressed understanding of the importance of proper follow up  care.   Clent Demark Zohal Reny M.D. Diseases & Surgery of the Retina and Vitreous Retina & Diabetic [place] 07/31/21     Abbreviations: M myopia (nearsighted); A astigmatism; H hyperopia (farsighted); P presbyopia; Mrx spectacle prescription;  CTL contact lenses; OD right eye; OS left eye; OU both eyes  XT exotropia; ET esotropia; PEK punctate epithelial keratitis; PEE punctate epithelial erosions; DES dry eye syndrome; MGD meibomian gland dysfunction; ATs artificial tears; PFAT's preservative free artificial tears; [place] nuclear sclerotic cataract; PSC posterior subcapsular cataract; ERM epi-retinal membrane; PVD posterior vitreous detachment; RD retinal detachment; DM diabetes mellitus; DR diabetic retinopathy; NPDR non-proliferative diabetic retinopathy; PDR proliferative diabetic retinopathy; CSME clinically significant macular edema; DME diabetic macular edema; dbh dot blot hemorrhages; CWS cotton wool spot; POAG primary open angle glaucoma; C/D cup-to-disc ratio; HVF humphrey visual field; GVF goldmann visual field; OCT optical coherence tomography; IOP intraocular pressure; BRVO Branch retinal vein occlusion; CRVO central retinal vein occlusion; CRAO central retinal artery occlusion; BRAO branch retinal artery occlusion; RT retinal tear; SB scleral buckle; PPV pars plana vitrectomy; VH Vitreous hemorrhage; PRP panretinal laser photocoagulation; IVK intravitreal kenalog; VMT vitreomacular traction; MH Macular  hole;  NVD neovascularization of the disc; NVE neovascularization elsewhere; AREDS age related eye disease study; ARMD age related macular degeneration; POAG primary open angle glaucoma; EBMD epithelial/anterior basement membrane dystrophy; ACIOL anterior chamber intraocular lens; IOL intraocular lens; PCIOL posterior chamber intraocular lens; Phaco/IOL phacoemulsification with intraocular lens placement; [place] photorefractive keratectomy; LASIK laser assisted in situ keratomileusis; HTN hypertension;  DM diabetes mellitus; COPD chronic obstructive pulmonary disease

## 2021-07-31 NOTE — Assessment & Plan Note (Signed)
OD not the limiting factor of acuity

## 2021-08-01 ENCOUNTER — Telehealth: Payer: Self-pay | Admitting: *Deleted

## 2021-08-01 ENCOUNTER — Telehealth: Payer: Self-pay

## 2021-08-01 NOTE — Telephone Encounter (Signed)
  Follow up Call-  Call back number 07/30/2021  Post procedure Call Back phone  # [phone]  Permission to leave phone message Yes  Some recent data might be hidden     Patient questions:  Do you have a fever, pain , or abdominal swelling? No. Pain Score  0 *  Have you tolerated food without any problems? Yes.    Have you been able to return to your normal activities? Yes.    Do you have any questions about your discharge instructions: Diet   No. Medications  No. Follow up visit  No.  Do you have questions or concerns about your Care? No.  Actions: * If pain score is 4 or above: No action needed, pain <4.  Have you developed a fever since your procedure? no  2.   Have you had an respiratory symptoms (SOB or cough) since your procedure? no  3.   Have you tested positive for COVID 19 since your procedure no  4.   Have you had any family members/close contacts diagnosed with the COVID 19 since your procedure?  no   If yes to any of these questions please route to Joylene John, RN and Joella Prince, RN

## 2021-08-01 NOTE — Telephone Encounter (Signed)
  Follow up Call-  Call back number 07/30/2021  Post procedure Call Back phone  # [phone]  Permission to leave phone message Yes  Some recent data might be hidden     Patient questions:  Message left to call us if necessary.

## 2021-08-04 ENCOUNTER — Encounter: Payer: Self-pay | Admitting: Internal Medicine

## 2021-08-09 ENCOUNTER — Other Ambulatory Visit: Payer: Self-pay | Admitting: Medical

## 2021-09-29 ENCOUNTER — Encounter (INDEPENDENT_AMBULATORY_CARE_PROVIDER_SITE_OTHER): Payer: Self-pay | Admitting: Ophthalmology

## 2021-09-29 ENCOUNTER — Ambulatory Visit (INDEPENDENT_AMBULATORY_CARE_PROVIDER_SITE_OTHER): Payer: Medicare HMO | Admitting: Ophthalmology

## 2021-09-29 ENCOUNTER — Other Ambulatory Visit: Payer: Self-pay

## 2021-09-29 DIAGNOSIS — H34811 Central retinal vein occlusion, right eye, with macular edema: Secondary | ICD-10-CM | POA: Diagnosis not present

## 2021-09-29 DIAGNOSIS — H353114 Nonexudative age-related macular degeneration, right eye, advanced atrophic with subfoveal involvement: Secondary | ICD-10-CM

## 2021-09-29 MED ORDER — AFLIBERCEPT 2MG/0.05ML IZ SOLN FOR KALEIDOSCOPE
2.0000 mg | INTRAVITREAL | Status: AC | PRN
  Administered 2021-09-29: 09:00:00 2 mg via INTRAVITREAL

## 2021-09-29 NOTE — Assessment & Plan Note (Signed)
Geographic atrophy limits acuity OD

## 2021-09-29 NOTE — Progress Notes (Signed)
09/29/2021     CHIEF COMPLAINT Patient presents for  Chief Complaint  Patient presents with   Retina Follow Up      HISTORY OF PRESENT ILLNESS: Brent Espinoza is a 70 y.o. male who presents to the clinic today for:   HPI     Retina Follow Up           Diagnosis: CRVO/BRVO   Laterality: right eye   Onset: 9 weeks ago   Severity: mild   Duration: 9 weeks   Course: stable         Comments   9 week fu OD and OCT and Eylea OD Pt states VA OU stable since last visit. Pt denies FOL, floaters, or ocular pain OU.  Pt states, "My blood sugars have been doing good and I cannot tell any change in my vision."       Last edited by Kendra Opitz, COA on 09/29/2021  8:17 AM.      Referring physician: Carlena Hurl, PA-C [place],  [place] [id number]  HISTORICAL INFORMATION:   Selected notes from the MEDICAL RECORD NUMBER    Lab Results  Component Value Date   HGBA1C 6.3 (H) 05/28/2021     CURRENT MEDICATIONS: No current outpatient medications on file. (Ophthalmic Drugs)   No current facility-administered medications for this visit. (Ophthalmic Drugs)   Current Outpatient Medications (Other)  Medication Sig   aspirin EC 81 MG tablet Take 1 tablet (81 mg total) by mouth daily.   atorvastatin (LIPITOR) 40 MG tablet Take 1 tablet (40 mg total) by mouth daily.   metFORMIN (GLUCOPHAGE) 500 MG tablet TAKE 1 TABLET EVERY DAY WITH BREAKFAST   naproxen sodium (ALEVE) 220 MG tablet Take 220 mg by mouth. Takes PRN   No current facility-administered medications for this visit. (Other)      REVIEW OF SYSTEMS:    ALLERGIES Allergies  Allergen Reactions   Penicillins     As child     PAST MEDICAL HISTORY Past Medical History:  Diagnosis Date   Abnormal CT of the chest 06/08/2018   Allergy    mild   Arthritis    Atherosclerosis of aorta ([place]) 06/08/2018   Cancer ([place]) 2022   skin cancer   Cataract    removed right eye   Coronary  artery disease involving native heart without angina pectoris 06/08/2018   Diabetes mellitus without complication ([place])    "pre diabetes"   Erectile dysfunction 06/08/2018   Eye disease 02/18/2017   GERD (gastroesophageal reflux disease)    occ   HDL deficiency 02/19/2017   Hyperlipidemia    Impacted cerumen of right ear 02/18/2017   Leukocytosis 02/19/2017   Mixed dyslipidemia 02/19/2017   Overweight 02/18/2017   Smoking    Tobacco use 02/18/2017   Past Surgical History:  Procedure Laterality Date   COLONOSCOPY     EYE SURGERY     gets therapy injections for vessel disease, hx/o cataract surgery   MOUTH SURGERY      FAMILY HISTORY Family History  Problem Relation Age of Onset   Other Mother        eye disease, died of old age   Other Father        health history unknown   Cancer Neg Hx    Diabetes Neg Hx    Heart disease Neg Hx    Stroke Neg Hx    Hyperlipidemia Neg Hx    Hypertension Neg Hx  Colon cancer Neg Hx    Colon polyps Neg Hx    Rectal cancer Neg Hx    Stomach cancer Neg Hx     SOCIAL HISTORY Social History   Tobacco Use   Smoking status: Every Day    Packs/day: 1.00    Years: 50.00    Pack years: 50.00    Types: Cigarettes   Smokeless tobacco: Never  Vaping Use   Vaping Use: Never used  Substance Use Topics   Alcohol use: Yes    Comment: occ   Drug use: No         OPHTHALMIC EXAM:  Base Eye Exam     Visual Acuity (ETDRS)       Right Left   Dist cc 20/200 -1 20/20 -1   Dist ph cc NI          Tonometry (Tonopen, 8:21 AM)       Right Left   Pressure 20 20         Pupils       Pupils Shape React APD   Right PERRL Round Brisk None   Left PERRL Round Brisk None         Visual Fields (Counting fingers)       Left Right    Full Full         Extraocular Movement       Right Left    Full, Ortho Full, Ortho         Neuro/Psych     Oriented x3: Yes   Mood/Affect: Normal         Dilation     Right  eye: 1.0% Mydriacyl, 2.5% Phenylephrine @ 8:21 AM           Slit Lamp and Fundus Exam     External Exam       Right Left   External Normal Normal         Slit Lamp Exam       Right Left   Lids/Lashes Normal Normal   Conjunctiva/Sclera White and quiet White and quiet   Cornea Clear Clear   Anterior Chamber Deep and quiet Deep and quiet   Iris Round and reactive Round and reactive   Lens Posterior chamber intraocular lens 2+ Nuclear sclerosis   Anterior Vitreous Normal Normal         Fundus Exam       Right Left   Posterior Vitreous Vitreal papillary traction    Disc Collateralization on the nerve, no NVD    C/D Ratio 0.0    Macula Geographic atrophy, Macular thickening, Cystoid macular edema, Microaneurysms, Epiretinal membrane    Vessels Old central retinal vein occlusion, ischemic at present.    Periphery Normal, good PRP 360             IMAGING AND PROCEDURES  Imaging and Procedures for 09/29/21  OCT, Retina - OU - Both Eyes       Right Eye Quality was good. Scan locations included subfoveal. Central Foveal Thickness: 261. Findings include abnormal foveal contour, outer retinal atrophy, central retinal atrophy, epiretinal membrane.   Left Eye Quality was good. Scan locations included subfoveal. Central Foveal Thickness: 305. Progression has been stable. Findings include normal foveal contour, vitreomacular adhesion .   Notes OD persistent foveal atrophy from longstanding chronic recurrent CME from CRV O.  Less CME OD at 9-week follow-up today and stable, thus viable macular retina remains as it is  responsive to treatment with  Eylea currently at 9-week follow-up today we will repeat injection today and examination again around 9 weeks     Intravitreal Injection, Pharmacologic Agent - OD - Right Eye       Time Out 09/29/2021. 8:55 AM. Confirmed correct patient, procedure, site, and patient consented.   Anesthesia Topical anesthesia was used.  Anesthetic medications included Lidocaine 4%.   Procedure Preparation included 5% betadine to ocular surface, 10% betadine to eyelids. A 30 gauge needle was used.   Injection: 2 mg aflibercept 2 MG/0.05ML   Route: Intravitreal, Site: Right Eye   NDC: [id number], Lot: [id number], Waste: 0 mL   Post-op Post injection exam found visual acuity of at least counting fingers. The patient tolerated the procedure well. There were no complications. The patient received written and verbal post procedure care education. Post injection medications included ocuflox.              ASSESSMENT/PLAN:  Advanced nonexudative age-related macular degeneration of right eye with subfoveal involvement Geographic atrophy limits acuity OD  Central retinal vein occlusion with macular edema of right eye Perifoveal CME recurs after many times after variable intervals of therapy attempted with extension and trial.  Currently maintained and stable macular perfusion and no CME worsening at 9 weeks.  Interval of examination and Eylea delivery OD.  We will repeat injection today to maintain.     ICD-10-CM   1. Central retinal vein occlusion with macular edema of right eye  H34.8110 OCT, Retina - OU - Both Eyes    Intravitreal Injection, Pharmacologic Agent - OD - Right Eye    aflibercept (EYLEA) SOLN 2 mg    2. Advanced nonexudative age-related macular degeneration of right eye with subfoveal involvement  H35.3114       1.  OD, remained stable on at 9-week follow-up interval intravitreal Eylea for perifoveal CME with history of multiple recurrences over the years.  We will maintain 9-week follow-up interval at this time.  2.  Vision acuity limited OD by subfoveal geographic atrophy.  3.  Ophthalmic Meds Ordered this visit:  Meds ordered this encounter  Medications   aflibercept (EYLEA) SOLN 2 mg       Return in about 9 weeks (around 12/01/2021) for DILATE OU, EYLEA OCT, OD.  There are no Patient  Instructions on file for this visit.   Explained the diagnoses, plan, and follow up with the patient and they expressed understanding.  Patient expressed understanding of the importance of proper follow up care.   Clent Demark Quinnley Colasurdo M.D. Diseases & Surgery of the Retina and Vitreous Retina & Diabetic [place] 09/29/21     Abbreviations: M myopia (nearsighted); A astigmatism; H hyperopia (farsighted); P presbyopia; Mrx spectacle prescription;  CTL contact lenses; OD right eye; OS left eye; OU both eyes  XT exotropia; ET esotropia; PEK punctate epithelial keratitis; PEE punctate epithelial erosions; DES dry eye syndrome; MGD meibomian gland dysfunction; ATs artificial tears; PFAT's preservative free artificial tears; [place] nuclear sclerotic cataract; PSC posterior subcapsular cataract; ERM epi-retinal membrane; PVD posterior vitreous detachment; RD retinal detachment; DM diabetes mellitus; DR diabetic retinopathy; NPDR non-proliferative diabetic retinopathy; PDR proliferative diabetic retinopathy; CSME clinically significant macular edema; DME diabetic macular edema; dbh dot blot hemorrhages; CWS cotton wool spot; POAG primary open angle glaucoma; C/D cup-to-disc ratio; HVF humphrey visual field; GVF goldmann visual field; OCT optical coherence tomography; IOP intraocular pressure; BRVO Branch retinal vein occlusion; CRVO central retinal vein occlusion; CRAO central retinal artery occlusion; BRAO branch retinal artery occlusion;  RT retinal tear; SB scleral buckle; PPV pars plana vitrectomy; VH Vitreous hemorrhage; PRP panretinal laser photocoagulation; IVK intravitreal kenalog; VMT vitreomacular traction; MH Macular hole;  NVD neovascularization of the disc; NVE neovascularization elsewhere; AREDS age related eye disease study; ARMD age related macular degeneration; POAG primary open angle glaucoma; EBMD epithelial/anterior basement membrane dystrophy; ACIOL anterior chamber intraocular lens; IOL intraocular  lens; PCIOL posterior chamber intraocular lens; Phaco/IOL phacoemulsification with intraocular lens placement; [place] photorefractive keratectomy; LASIK laser assisted in situ keratomileusis; HTN hypertension; DM diabetes mellitus; COPD chronic obstructive pulmonary disease

## 2021-09-29 NOTE — Assessment & Plan Note (Signed)
Perifoveal CME recurs after many times after variable intervals of therapy attempted with extension and trial.  Currently maintained and stable macular perfusion and no CME worsening at 9 weeks.  Interval of examination and Eylea delivery OD.  We will repeat injection today to maintain.

## 2021-10-02 ENCOUNTER — Encounter (INDEPENDENT_AMBULATORY_CARE_PROVIDER_SITE_OTHER): Payer: Medicare HMO | Admitting: Ophthalmology

## 2021-12-01 ENCOUNTER — Ambulatory Visit (INDEPENDENT_AMBULATORY_CARE_PROVIDER_SITE_OTHER): Payer: Medicare HMO | Admitting: Ophthalmology

## 2021-12-01 ENCOUNTER — Encounter (INDEPENDENT_AMBULATORY_CARE_PROVIDER_SITE_OTHER): Payer: Self-pay | Admitting: Ophthalmology

## 2021-12-01 ENCOUNTER — Encounter (INDEPENDENT_AMBULATORY_CARE_PROVIDER_SITE_OTHER): Payer: Medicare HMO | Admitting: Ophthalmology

## 2021-12-01 ENCOUNTER — Other Ambulatory Visit: Payer: Self-pay

## 2021-12-01 DIAGNOSIS — H34811 Central retinal vein occlusion, right eye, with macular edema: Secondary | ICD-10-CM | POA: Diagnosis not present

## 2021-12-01 DIAGNOSIS — H353114 Nonexudative age-related macular degeneration, right eye, advanced atrophic with subfoveal involvement: Secondary | ICD-10-CM

## 2021-12-01 DIAGNOSIS — H2512 Age-related nuclear cataract, left eye: Secondary | ICD-10-CM | POA: Diagnosis not present

## 2021-12-01 MED ORDER — AFLIBERCEPT 2MG/0.05ML IZ SOLN FOR KALEIDOSCOPE
2.0000 mg | INTRAVITREAL | Status: AC | PRN
  Administered 2021-12-01: 2 mg via INTRAVITREAL

## 2021-12-01 NOTE — Assessment & Plan Note (Signed)
With good acuity OS continue to observe

## 2021-12-01 NOTE — Assessment & Plan Note (Signed)
Increased CME today from CR[facility] O at 9-week interval post Eylea.  We will need to repeat injection today and follow-up again in 8 weeks to maintain

## 2021-12-01 NOTE — Progress Notes (Signed)
12/01/2021     CHIEF COMPLAINT Patient presents for  Chief Complaint  Patient presents with   Central Retinal Vein Occlusion      HISTORY OF PRESENT ILLNESS: Brent Espinoza is a 71 y.o. male who presents to the clinic today for:   HPI   9 weeks dilate OU Eylea OCT, OD. Patient states vision is stable and unchanged since last visit. Denies any new floaters or FOL. Pt states "last time the q tip irritated my eye for the numbing before the injection. The drops seemed to work better." Pt also requests to use the antibiotic ointment post injection today.  Last edited by Laurin Coder on 12/01/2021  8:46 AM.      Referring physician: Carlena Hurl, PA-C [place],  [place] [id number]  HISTORICAL INFORMATION:   Selected notes from the MEDICAL RECORD NUMBER    Lab Results  Component Value Date   HGBA1C 6.3 (H) 05/28/2021     CURRENT MEDICATIONS: No current outpatient medications on file. (Ophthalmic Drugs)   No current facility-administered medications for this visit. (Ophthalmic Drugs)   Current Outpatient Medications (Other)  Medication Sig   aspirin EC 81 MG tablet Take 1 tablet (81 mg total) by mouth daily.   atorvastatin (LIPITOR) 40 MG tablet Take 1 tablet (40 mg total) by mouth daily.   metFORMIN (GLUCOPHAGE) 500 MG tablet TAKE 1 TABLET EVERY DAY WITH BREAKFAST   naproxen sodium (ALEVE) 220 MG tablet Take 220 mg by mouth. Takes PRN   No current facility-administered medications for this visit. (Other)      REVIEW OF SYSTEMS:    ALLERGIES Allergies  Allergen Reactions   Penicillins     As child     PAST MEDICAL HISTORY Past Medical History:  Diagnosis Date   Abnormal CT of the chest 06/08/2018   Allergy    mild   Arthritis    Atherosclerosis of aorta ([place]) 06/08/2018   Cancer ([place]) 2022   skin cancer   Cataract    removed right eye   Coronary artery disease involving native heart without angina pectoris 06/08/2018    Diabetes mellitus without complication ([place])    "pre diabetes"   Erectile dysfunction 06/08/2018   Eye disease 02/18/2017   GERD (gastroesophageal reflux disease)    occ   HDL deficiency 02/19/2017   Hyperlipidemia    Impacted cerumen of right ear 02/18/2017   Leukocytosis 02/19/2017   Mixed dyslipidemia 02/19/2017   Overweight 02/18/2017   Smoking    Tobacco use 02/18/2017   Past Surgical History:  Procedure Laterality Date   COLONOSCOPY     EYE SURGERY     gets therapy injections for vessel disease, hx/o cataract surgery   MOUTH SURGERY      FAMILY HISTORY Family History  Problem Relation Age of Onset   Other Mother        eye disease, died of old age   Other Father        health history unknown   Cancer Neg Hx    Diabetes Neg Hx    Heart disease Neg Hx    Stroke Neg Hx    Hyperlipidemia Neg Hx    Hypertension Neg Hx    Colon cancer Neg Hx    Colon polyps Neg Hx    Rectal cancer Neg Hx    Stomach cancer Neg Hx     SOCIAL HISTORY Social History   Tobacco Use   Smoking status: Every  Day    Packs/day: 1.00    Years: 50.00    Pack years: 50.00    Types: Cigarettes   Smokeless tobacco: Never  Vaping Use   Vaping Use: Never used  Substance Use Topics   Alcohol use: Yes    Comment: occ   Drug use: No         OPHTHALMIC EXAM:  Base Eye Exam     Visual Acuity (ETDRS)       Right Left   Dist [place] 20/200 -1 20/25 +2   Dist ph [place] NI          Tonometry (Tonopen, 8:36 AM)       Right Left   Pressure 20 24         Pupils       Pupils Dark Light APD   Right PERRL 4 3 None   Left PERRL 4 3 None         Visual Fields (Counting fingers)       Left Right    Full    Restrictions  Partial outer inferior temporal, inferior nasal deficiencies         Extraocular Movement       Right Left    Full Full         Neuro/Psych     Oriented x3: Yes   Mood/Affect: Normal         Dilation     Both eyes: 1.0% Mydriacyl, 2.5%  Phenylephrine @ 8:36 AM           Slit Lamp and Fundus Exam     External Exam       Right Left   External Normal Normal         Slit Lamp Exam       Right Left   Lids/Lashes Normal Normal   Conjunctiva/Sclera White and quiet White and quiet   Cornea Clear Clear   Anterior Chamber Deep and quiet Deep and quiet   Iris Round and reactive Round and reactive   Lens Posterior chamber intraocular lens 2+ Nuclear sclerosis   Anterior Vitreous Normal Normal         Fundus Exam       Right Left   Posterior Vitreous Vitreal papillary traction Normal   Disc Collateralization on the nerve, no NVD Normal   C/D Ratio 0.0 0.0   Macula Geographic atrophy, Macular thickening, Cystoid macular edema, Microaneurysms, Epiretinal membrane Normal   Vessels Old central retinal vein occlusion, ischemic at present. Normal   Periphery Normal, good PRP 360 Normal            IMAGING AND PROCEDURES  Imaging and Procedures for 12/01/21  Intravitreal Injection, Pharmacologic Agent - OD - Right Eye       Time Out 12/01/2021. 8:55 AM. Confirmed correct patient, procedure, site, and patient consented.   Anesthesia Topical anesthesia was used. Anesthetic medications included Lidocaine 4%.   Procedure Preparation included 5% betadine to ocular surface, 10% betadine to eyelids. A 30 gauge needle was used.   Injection: 2 mg aflibercept 2 MG/0.05ML   Route: Intravitreal, Site: Right Eye   NDC: [id number], Lot: [id number], Waste: 0 mL   Post-op Post injection exam found visual acuity of at least counting fingers. The patient tolerated the procedure well. There were no complications. The patient received written and verbal post procedure care education. Post injection medications included ocuflox.      OCT, Retina - OU - Both Eyes  Right Eye Quality was good. Scan locations included subfoveal. Central Foveal Thickness: 387. Findings include abnormal foveal contour, outer  retinal atrophy, central retinal atrophy, epiretinal membrane.   Left Eye Quality was good. Scan locations included subfoveal. Central Foveal Thickness: 307. Progression has been stable. Findings include normal foveal contour, vitreomacular adhesion .   Notes OD persistent foveal atrophy from longstanding chronic recurrent CME from CRV O.  Increased CME OD at 9-week follow-up today and unstable, thus viable macular retina remains as it is  responsive to treatment with Eylea currently at 9-week in the past but now increased to 9 weeks today follow-up today we will repeat injection today and examination again around 8  weeks             ASSESSMENT/PLAN:  Central retinal vein occlusion with macular edema of right eye Increased CME today from CR[facility] O at 9-week interval post Eylea.  We will need to repeat injection today and follow-up again in 8 weeks to maintain  Advanced nonexudative age-related macular degeneration of right eye with subfoveal involvement Minor OD  Nuclear sclerotic cataract of left eye With good acuity OS continue to observe     ICD-10-CM   1. Central retinal vein occlusion with macular edema of right eye  H34.8110 Intravitreal Injection, Pharmacologic Agent - OD - Right Eye    OCT, Retina - OU - Both Eyes    aflibercept (EYLEA) SOLN 2 mg    2. Advanced nonexudative age-related macular degeneration of right eye with subfoveal involvement  H35.3114     3. Nuclear sclerotic cataract of left eye  H25.12       1.  Chronic longstanding CRVO with secondary CME OD, controlled in the past with Eylea intravitreal  at 9-week interval allowing for 20/200 vision.  Now at 9-week interval CME has recurred, will repeat injection today and follow-up next in 8 weeks  2.  OS with minor NSC changes follow-up with Dr. Delight Stare as scheduled  3.  Ophthalmic Meds Ordered this visit:  Meds ordered this encounter  Medications   aflibercept (EYLEA) SOLN 2 mg       Return in  about 8 weeks (around 01/26/2022) for DILATE OU, EYLEA OCT, OD.  There are no Patient Instructions on file for this visit.   Explained the diagnoses, plan, and follow up with the patient and they expressed understanding.  Patient expressed understanding of the importance of proper follow up care.   Clent Demark Sabriah Hobbins M.D. Diseases & Surgery of the Retina and Vitreous Retina & Diabetic [place] 12/01/21     Abbreviations: M myopia (nearsighted); A astigmatism; H hyperopia (farsighted); P presbyopia; Mrx spectacle prescription;  CTL contact lenses; OD right eye; OS left eye; OU both eyes  XT exotropia; ET esotropia; PEK punctate epithelial keratitis; PEE punctate epithelial erosions; DES dry eye syndrome; MGD meibomian gland dysfunction; ATs artificial tears; PFAT's preservative free artificial tears; [place] nuclear sclerotic cataract; PSC posterior subcapsular cataract; ERM epi-retinal membrane; PVD posterior vitreous detachment; RD retinal detachment; DM diabetes mellitus; DR diabetic retinopathy; NPDR non-proliferative diabetic retinopathy; PDR proliferative diabetic retinopathy; CSME clinically significant macular edema; DME diabetic macular edema; dbh dot blot hemorrhages; CWS cotton wool spot; POAG primary open angle glaucoma; C/D cup-to-disc ratio; HVF humphrey visual field; GVF goldmann visual field; OCT optical coherence tomography; IOP intraocular pressure; BRVO Branch retinal vein occlusion; CRVO central retinal vein occlusion; CRAO central retinal artery occlusion; BRAO branch retinal artery occlusion; RT retinal tear; SB scleral buckle; PPV pars  plana vitrectomy; VH Vitreous hemorrhage; PRP panretinal laser photocoagulation; IVK intravitreal kenalog; VMT vitreomacular traction; MH Macular hole;  NVD neovascularization of the disc; NVE neovascularization elsewhere; AREDS age related eye disease study; ARMD age related macular degeneration; POAG primary open angle glaucoma; EBMD  epithelial/anterior basement membrane dystrophy; ACIOL anterior chamber intraocular lens; IOL intraocular lens; PCIOL posterior chamber intraocular lens; Phaco/IOL phacoemulsification with intraocular lens placement; [place] photorefractive keratectomy; LASIK laser assisted in situ keratomileusis; HTN hypertension; DM diabetes mellitus; COPD chronic obstructive pulmonary disease

## 2021-12-01 NOTE — Assessment & Plan Note (Signed)
Minor OD 

## 2021-12-02 ENCOUNTER — Other Ambulatory Visit: Payer: Self-pay | Admitting: Cardiovascular Disease

## 2021-12-02 DIAGNOSIS — Z720 Tobacco use: Secondary | ICD-10-CM

## 2021-12-02 DIAGNOSIS — E782 Mixed hyperlipidemia: Secondary | ICD-10-CM

## 2021-12-03 NOTE — Telephone Encounter (Signed)
Refill request

## 2022-01-08 ENCOUNTER — Encounter (INDEPENDENT_AMBULATORY_CARE_PROVIDER_SITE_OTHER): Payer: Self-pay

## 2022-01-08 DIAGNOSIS — E119 Type 2 diabetes mellitus without complications: Secondary | ICD-10-CM | POA: Diagnosis not present

## 2022-01-08 DIAGNOSIS — H524 Presbyopia: Secondary | ICD-10-CM | POA: Diagnosis not present

## 2022-01-12 ENCOUNTER — Other Ambulatory Visit: Payer: Self-pay | Admitting: Medical

## 2022-01-14 DIAGNOSIS — D225 Melanocytic nevi of trunk: Secondary | ICD-10-CM | POA: Diagnosis not present

## 2022-01-14 DIAGNOSIS — L821 Other seborrheic keratosis: Secondary | ICD-10-CM | POA: Diagnosis not present

## 2022-01-26 ENCOUNTER — Encounter (INDEPENDENT_AMBULATORY_CARE_PROVIDER_SITE_OTHER): Payer: Medicare HMO | Admitting: Ophthalmology

## 2022-01-26 ENCOUNTER — Ambulatory Visit (INDEPENDENT_AMBULATORY_CARE_PROVIDER_SITE_OTHER): Payer: Medicare HMO | Admitting: Ophthalmology

## 2022-01-26 ENCOUNTER — Encounter (INDEPENDENT_AMBULATORY_CARE_PROVIDER_SITE_OTHER): Payer: Self-pay | Admitting: Ophthalmology

## 2022-01-26 DIAGNOSIS — H353114 Nonexudative age-related macular degeneration, right eye, advanced atrophic with subfoveal involvement: Secondary | ICD-10-CM | POA: Diagnosis not present

## 2022-01-26 DIAGNOSIS — H43822 Vitreomacular adhesion, left eye: Secondary | ICD-10-CM | POA: Diagnosis not present

## 2022-01-26 DIAGNOSIS — H34811 Central retinal vein occlusion, right eye, with macular edema: Secondary | ICD-10-CM

## 2022-01-26 DIAGNOSIS — H2512 Age-related nuclear cataract, left eye: Secondary | ICD-10-CM

## 2022-01-26 MED ORDER — AFLIBERCEPT 2MG/0.05ML IZ SOLN FOR KALEIDOSCOPE
2.0000 mg | INTRAVITREAL | Status: AC | PRN
  Administered 2022-01-26: 2 mg via INTRAVITREAL

## 2022-01-26 NOTE — Progress Notes (Signed)
? ? ?01/26/2022 ? ?  ? ?CHIEF COMPLAINT ?Patient presents for  ?Chief Complaint  ?Patient presents with  ? Central Retinal Vein Occlusion  ? ? ? ? ?HISTORY OF PRESENT ILLNESS: ?Brent Espinoza is a 71 y.o. male who presents to the clinic today for:  ? ?HPI   ?8 weeks dilate OU, Eylea OCT OD. ?Patient states vision is stable and unchanged since last visit. Denies any new floaters or FOL. ? ?Last edited by Laurin Coder on 01/26/2022  8:36 AM.  ?  ? ? ?Referring physician: ?Tysinger, Camelia Eng, PA-C ?[address] ?[place],  [place] [id number] ? ?HISTORICAL INFORMATION:  ? ?Selected notes from the [facility] ?  ? ?Lab Results  ?Component Value Date  ? HGBA1C 6.3 (H) 05/28/2021  ?  ? ?CURRENT MEDICATIONS: ?No current outpatient medications on file. (Ophthalmic Drugs)  ? ?No current facility-administered medications for this visit. (Ophthalmic Drugs)  ? ?Current Outpatient Medications (Other)  ?Medication Sig  ? aspirin EC 81 MG tablet Take 1 tablet (81 mg total) by mouth daily.  ? atorvastatin (LIPITOR) 40 MG tablet Take 1 tablet (40 mg total) by mouth daily. Schedule an appointment for further refills, 1st attempt  ? metFORMIN (GLUCOPHAGE) 500 MG tablet TAKE 1 TABLET EVERY DAY WITH BREAKFAST  ? naproxen sodium (ALEVE) 220 MG tablet Take 220 mg by mouth. Takes PRN  ? ?No current facility-administered medications for this visit. (Other)  ? ? ? ? ?REVIEW OF SYSTEMS: ?ROS   ?Negative for: Constitutional, Gastrointestinal, Neurological, Skin, Genitourinary, Musculoskeletal, HENT, Endocrine, Cardiovascular, Eyes, Respiratory, Psychiatric, Allergic/Imm, Heme/Lymph ?Last edited by Hurman Horn, MD on 01/26/2022  9:22 AM.  ?  ? ? ? ?ALLERGIES ?Allergies  ?Allergen Reactions  ? Penicillins   ?  As child   ? ? ?PAST MEDICAL HISTORY ?Past Medical History:  ?Diagnosis Date  ? Abnormal CT of the chest 06/08/2018  ? Allergy   ? mild  ? Arthritis   ? Atherosclerosis of aorta ([place]) 06/08/2018  ? Cancer [facility]) 2022  ? skin  cancer  ? Cataract   ? removed right eye  ? Coronary artery disease involving native heart without angina pectoris 06/08/2018  ? Diabetes mellitus without complication ([place])   ? "pre diabetes"  ? Erectile dysfunction 06/08/2018  ? Eye disease 02/18/2017  ? GERD (gastroesophageal reflux disease)   ? occ  ? HDL deficiency 02/19/2017  ? Hyperlipidemia   ? Impacted cerumen of right ear 02/18/2017  ? Leukocytosis 02/19/2017  ? Mixed dyslipidemia 02/19/2017  ? Overweight 02/18/2017  ? Smoking   ? Tobacco use 02/18/2017  ? ?Past Surgical History:  ?Procedure Laterality Date  ? COLONOSCOPY    ? EYE SURGERY    ? gets therapy injections for vessel disease, hx/o cataract surgery  ? MOUTH SURGERY    ? ? ?FAMILY HISTORY ?Family History  ?Problem Relation Age of Onset  ? Other Mother   ?     eye disease, died of old age  ? Other Father   ?     health history unknown  ? Cancer Neg Hx   ? Diabetes Neg Hx   ? Heart disease Neg Hx   ? Stroke Neg Hx   ? Hyperlipidemia Neg Hx   ? Hypertension Neg Hx   ? Colon cancer Neg Hx   ? Colon polyps Neg Hx   ? Rectal cancer Neg Hx   ? Stomach cancer Neg Hx   ? ? ?SOCIAL HISTORY ?Social History  ? ?  Tobacco Use  ? Smoking status: Every Day  ?  Packs/day: 1.00  ?  Years: 50.00  ?  Pack years: 50.00  ?  Types: Cigarettes  ? Smokeless tobacco: Never  ?Vaping Use  ? Vaping Use: Never used  ?Substance Use Topics  ? Alcohol use: Yes  ?  Comment: occ  ? Drug use: No  ? ?  ? ?  ? ?OPHTHALMIC EXAM: ? ?Base Eye Exam   ? ? Visual Acuity (ETDRS)   ? ?   Right Left  ? Dist cc 20/250 20/25 -2  ? Dist ph cc NI   ? ? Correction: Glasses  ? ?  ?  ? ? Tonometry (Tonopen, 8:36 AM)   ? ?   Right Left  ? Pressure 10 13  ? ?  ?  ? ? Pupils   ? ?   Pupils Dark Light APD  ? Right PERRL 4 3 None  ? Left PERRL 4 3 None  ? ?  ?  ? ? Extraocular Movement   ? ?   Right Left  ?  Full Full  ? ?  ?  ? ? Neuro/Psych   ? ? Oriented x3: Yes  ? Mood/Affect: Normal  ? ?  ?  ? ? Dilation   ? ? Both eyes: 1.0% Mydriacyl, 2.5%  Phenylephrine @ 8:36 AM  ? ?  ?  ? ?  ? ?Slit Lamp and Fundus Exam   ? ? External Exam   ? ?   Right Left  ? External Normal Normal  ? ?  ?  ? ? Slit Lamp Exam   ? ?   Right Left  ? Lids/Lashes Normal Normal  ? Conjunctiva/Sclera White and quiet White and quiet  ? Cornea Clear Clear  ? Anterior Chamber Deep and quiet Deep and quiet  ? Iris Round and reactive Round and reactive  ? Lens Posterior chamber intraocular lens 2+ Nuclear sclerosis  ? Anterior Vitreous Normal Normal  ? ?  ?  ? ? Fundus Exam   ? ?   Right Left  ? Posterior Vitreous Vitreal papillary traction Normal  ? Disc Collateralization on the nerve, no NVD Normal  ? C/D Ratio 0.0 0.0  ? Macula Geographic atrophy, Macular thickening, Cystoid macular edema, Microaneurysms, Epiretinal membrane Normal  ? Vessels Old central retinal vein occlusion, ischemic at present. Normal  ? Periphery Normal, good PRP 360, room temporal and nasal should more be needed Normal  ? ?  ?  ? ?  ? ? ?IMAGING AND PROCEDURES  ?Imaging and Procedures for 01/26/22 ? ?Intravitreal Injection, Pharmacologic Agent - OD - Right Eye   ? ?   ?Time Out ?01/26/2022. 9:24 AM. Confirmed correct patient, procedure, site, and patient consented.  ? ?Anesthesia ?Topical anesthesia was used. Anesthetic medications included Lidocaine 4%.  ? ?Procedure ?Preparation included 5% betadine to ocular surface, 10% betadine to eyelids. A 30 gauge needle was used.  ? ?Injection: ?2 mg aflibercept 2 MG/0.05ML ?  Route: Intravitreal, Site: Right Eye ?  [place]: [id number], Lot: [id number], Waste: 0 mL  ? ?Post-op ?Post injection exam found visual acuity of at least counting fingers. The patient tolerated the procedure well. There were no complications. The patient received written and verbal post procedure care education. Post injection medications included ocuflox.  ? ?  ? ?OCT, Retina - OU - Both Eyes   ? ?   ?Right Eye ?Quality was good. Scan locations included subfoveal. Central Foveal Thickness: 385.  Progression has been stable. Findings include abnormal foveal contour, outer retinal atrophy, central retinal atrophy, epiretinal membrane.  ? ?Left Eye ?Quality was good. Scan locations included subfoveal. Central Foveal Thickness: 305. Progression has been stable. Findings include normal foveal contour, vitreomacular adhesion .  ? ?Notes ?OD persistent foveal atrophy from longstanding chronic recurrent CME from [facility] O.  Stable CME OD at 8-week follow-up today and  thus viable macular retina remains as it is  responsive to treatment with Eylea currently at 8-week.  Yet in the past extension to 9 weeks was accountable for increases in CME and decline in acuity. ? ?  ? ? ?  ?  ? ?  ?ASSESSMENT/PLAN: ? ?Advanced nonexudative age-related macular degeneration of right eye with subfoveal involvement ?Some geographic atrophy does not fact account for some acuity changes OD ? ?Central retinal vein occlusion with macular edema of right eye ?Chronic active and recurrent CME from CR[facility] O yet stabilized at 8 weeks ? ?OD persistent foveal atrophy from longstanding chronic recurrent CME from [facility].  Stable CME OD at 8-week follow-up today and  thus viable macular retina remains as it is  responsive to treatment with Eylea currently at 8-week.  Yet in the past extension to 9 weeks was accountable for increases in CME and decline in acuity. ? ?Nuclear sclerotic cataract of left eye ?Minor OS good acuity ? ?Vitreomacular adhesion of left eye ?Physiologic OS  ? ?  ICD-10-CM   ?1. Central retinal vein occlusion with macular edema of right eye  H34.8110 Intravitreal Injection, Pharmacologic Agent - OD - Right Eye  ?  OCT, Retina - OU - Both Eyes  ?  aflibercept (EYLEA) SOLN 2 mg  ?  ?2. Advanced nonexudative age-related macular degeneration of right eye with subfoveal involvement  H35.3114   ?  ?3. Nuclear sclerotic cataract of left eye  H25.12   ?  ?4. Vitreomacular adhesion of left eye  H43.822   ?  ? ? ?1.  OD vastly improved overall  with controlled CME from CR[facility] O ischemic, room for more peripheral PRP available if NVE were to develop.  Vision limited by some geographic atrophy dry AMD. ? ?Current successful interval to control CME is 8 weeks post [facility]

## 2022-01-26 NOTE — Assessment & Plan Note (Signed)
Chronic active and recurrent CME from CR[facility] O yet stabilized at 8 weeks ? ?OD persistent foveal atrophy from longstanding chronic recurrent CME from [facility] O.  Stable CME OD at 8-week follow-up today and  thus viable macular retina remains as it is  responsive to treatment with Eylea currently at 8-week.  Yet in the past extension to 9 weeks was accountable for increases in CME and decline in acuity. ?

## 2022-01-26 NOTE — Assessment & Plan Note (Signed)
Physiologic OS 

## 2022-01-26 NOTE — Assessment & Plan Note (Signed)
Some geographic atrophy does not fact account for some acuity changes OD ?

## 2022-01-26 NOTE — Assessment & Plan Note (Signed)
Minor OS good acuity ?

## 2022-02-17 ENCOUNTER — Ambulatory Visit: Payer: Medicare HMO | Admitting: Cardiovascular Disease

## 2022-02-17 ENCOUNTER — Encounter: Payer: Self-pay | Admitting: Cardiovascular Disease

## 2022-02-17 VITALS — BP 138/76 | HR 55 | Ht 67.0 in | Wt 162.8 lb

## 2022-02-17 DIAGNOSIS — I739 Peripheral vascular disease, unspecified: Secondary | ICD-10-CM | POA: Diagnosis not present

## 2022-02-17 DIAGNOSIS — Z720 Tobacco use: Secondary | ICD-10-CM

## 2022-02-17 DIAGNOSIS — I2510 Atherosclerotic heart disease of native coronary artery without angina pectoris: Secondary | ICD-10-CM

## 2022-02-17 DIAGNOSIS — E782 Mixed hyperlipidemia: Secondary | ICD-10-CM | POA: Diagnosis not present

## 2022-02-17 MED ORDER — ATORVASTATIN CALCIUM 40 MG PO TABS: 40.0000 mg | ORAL_TABLET | Freq: Every day | ORAL | 3 refills | Status: DC

## 2022-02-17 NOTE — Patient Instructions (Signed)
Medication Instructions:  ?No changes ?*If you need a refill on your cardiac medications before your next appointment, please call your pharmacy* ? ? ?Lab Work: ?None ordered ?If you have labs (blood work) drawn today and your tests are completely normal, you will receive your results only by: ?MyChart Message (if you have MyChart) OR ?A paper copy in the mail ?If you have any lab test that is abnormal or we need to change your treatment, we will call you to review the results. ? ? ?Testing/Procedures: ?None ordered ? ? ?Follow-Up: ?At [facility], you and your health needs are our priority.  As part of our continuing mission to provide you with exceptional heart care, we have created designated Provider Care Teams.  These Care Teams include your primary Cardiologist (physician) and Advanced Practice Providers (APPs -  Physician Assistants and Nurse Practitioners) who all work together to provide you with the care you need, when you need it. ? ?We recommend signing up for the patient portal called "MyChart".  Sign up information is provided on this After Visit Summary.  MyChart is used to connect with patients for Virtual Visits (Telemedicine).  Patients are able to view lab/test results, encounter notes, upcoming appointments, etc.  Non-urgent messages can be sent to your provider as well.   ?To learn more about what you can do with MyChart, go to [email or web address].   ? ?Your next appointment:   ?12 month(s) ? ?The format for your next appointment:   ?In Person ? ?Provider:   ?Dr. Fletcher Anon ? ?Other Instructions ?EXERCISE PROGRAM FOR INDIVIDUALS WITH  ?PERIPHERAL ARTERIAL DISEASE (PAD)  ? ?General Information:  ? ?Research in vascular exercise has demonstrated remarkable improvement in symptoms of leg pain (claudication) without expensive or invasive interventions. Regular walking programs are extremely helpful for patients with PAD and intermittent claudication.  ?These steps are designed to help you get  started with a safe and effective program to help you walk farther with less pain:  ? Walk at least three times a week (preferably every day). ? Your goal is to build up to 30-45 minutes of total walking time (not counting rest breaks). It may take you several weeks to build up your exercise time starting at 5-10 minutes or whatever you can tolerate. ? Walk as far as possible using moderate to maximal pain (7-8 on the scale below) as a signal to stop, and resume walking when the pain goes away. ? On a treadmill, set the speed and grade at a level that brings on the claudication pain within 3 to 5 minutes. Walk at this rate until you experience claudication of moderate severity, rest until the pain improves, and then resume walking. ? Over time, you will be able to walk longer at the designated speed and grade; workload should then be increased until you develop the pain within 3 to 5 minutes once again. ? This regimen will induce a significant benefit. Studies have demonstrated that participants may be able to walk up to three or four times farther and have less leg pain, within twelve weeks, by following this protocol. ? ?Pain Scale  ? ? 0_____1_____2_____3_____4_____5_____6_____7_____8_____9_____10  ? No Pain                                   Moderate Pain  Maximal Pain ? ? ?Important Information About Sugar ? ? ? ? ? ? ?

## 2022-02-17 NOTE — Progress Notes (Signed)
?  ?Cardiology Office Note ? ? ?Date:  02/17/2022  ? ?ID:  Brent Espinoza, DOB [birth year], MRN [id number] ? ?PCP:  Carlena Hurl, PA-C  ?Cardiologist:   Kathlyn Sacramento, MD  ? ?No chief complaint on file. ? ? ?  ?History of Present Illness: ?Brent Espinoza is a 71 y.o. male who is here today for af follow up visit regarding PAD.  ?The patient has prolonged history of smoking and hyperlipidemia.  He smokes 1 pack/day and has been doing so since 71 years old.  He has no family history of coronary artery disease.  He also has known history of coronary artery calcifications noted on prior CT scan ?He has known history of peripheral arterial disease with mild bilateral calf claudication and erectile dysfunction.   ?He had noninvasive vascular studies done in 2019 which showed mildly reduced ABI bilaterally in the 0.7 range.  Aortoiliac duplex showed severe bilateral common iliac artery stenosis in addition to an occluded left SFA. ?The patient did not want to have procedures done and this was treated medically.   ? ?He has been doing reasonably well and reports stable bilateral calf claudication which is currently not lifestyle limiting.  No chest pain or shortness of breath.  He continues to smoke 1 pack/day but takes his medications regularly. ? ?Past Medical History:  ?Diagnosis Date  ? Abnormal CT of the chest 06/08/2018  ? Allergy   ? mild  ? Arthritis   ? Atherosclerosis of aorta ([place]) 06/08/2018  ? Cancer [facility]) 2022  ? skin cancer  ? Cataract   ? removed right eye  ? Coronary artery disease involving native heart without angina pectoris 06/08/2018  ? Diabetes mellitus without complication ([place])   ? "pre diabetes"  ? Erectile dysfunction 06/08/2018  ? Eye disease 02/18/2017  ? GERD (gastroesophageal reflux disease)   ? occ  ? HDL deficiency 02/19/2017  ? Hyperlipidemia   ? Impacted cerumen of right ear 02/18/2017  ? Leukocytosis 02/19/2017  ? Mixed dyslipidemia 02/19/2017  ? Overweight 02/18/2017  ? Smoking    ? Tobacco use 02/18/2017  ? ? ?Past Surgical History:  ?Procedure Laterality Date  ? COLONOSCOPY    ? EYE SURGERY    ? gets therapy injections for vessel disease, hx/o cataract surgery  ? MOUTH SURGERY    ? ? ? ?Current Outpatient Medications  ?Medication Sig Dispense Refill  ? aspirin EC 81 MG tablet Take 1 tablet (81 mg total) by mouth daily. 90 tablet 3  ? metFORMIN (GLUCOPHAGE) 500 MG tablet TAKE 1 TABLET EVERY DAY WITH BREAKFAST 90 tablet 0  ? naproxen sodium (ALEVE) 220 MG tablet Take 220 mg by mouth. Takes PRN    ? atorvastatin (LIPITOR) 40 MG tablet Take 1 tablet (40 mg total) by mouth daily. 90 tablet 3  ? ?No current facility-administered medications for this visit.  ? ? ?Allergies:   Penicillins  ? ? ?Social History:  The patient  reports that he has been smoking cigarettes. He has a 50.00 pack-year smoking history. He has never used smokeless tobacco. He reports current alcohol use. He reports that he does not use drugs.  ? ?Family History:  The patient's family history is negative for coronary artery disease, peripheral arterial disease or cancer. ? ? ?ROS:  Please see the history of present illness.   Otherwise, review of systems are positive for none.   All other systems are reviewed and negative.  ? ? ?PHYSICAL EXAM: ?VS:  BP 138/76  Pulse (!) 55   Ht '5\' 7"'$  (1.702 m)   Wt 162 lb 12.8 oz (73.8 kg)   SpO2 100%   BMI 25.50 kg/m?  , BMI Body mass index is 25.5 kg/m?. ?GEN: Well nourished, well developed, in no acute distress  ?HEENT: normal  ?Neck: no JVD, carotid bruits, or masses ?Cardiac: RRR; no murmurs, rubs, or gallops,no edema  ?Respiratory:  clear to auscultation bilaterally, normal work of breathing ?GI: soft, nontender, nondistended, + BS ?MS: no deformity or atrophy  ?Skin: warm and dry, no rash ?Neuro:  Strength and sensation are intact ?Psych: euthymic mood, full affect ? ? ? ?EKG:  EKG is ordered today. ?EKG showed sinus bradycardia with no significant ST or T wave  changes. ? ? ?Recent Labs: ?05/28/2021: ALT 18; BUN 14; Creatinine, Ser 0.88; Hemoglobin 16.4; Platelets 323; Potassium 4.2; Sodium 139  ? ? ?Lipid Panel ?   ?Component Value Date/Time  ? CHOL 94 (L) 05/28/2021 1111  ? TRIG 73 05/28/2021 1111  ? HDL 26 (L) 05/28/2021 1111  ? CHOLHDL 3.6 05/28/2021 1111  ? CHOLHDL 5.4 (H) 02/18/2017 3419  ? VLDL 12 02/18/2017 0939  ? [place] 52 05/28/2021 1111  ? ?  ? ?Wt Readings from Last 3 Encounters:  ?02/17/22 162 lb 12.8 oz (73.8 kg)  ?07/30/21 170 lb (77.1 kg)  ?07/17/21 170 lb (77.1 kg)  ?  ? ? ? ?   ? View : No data to display.  ?  ?  ?  ? ? ? ? ?ASSESSMENT AND PLAN: ? ?1.  Peripheral arterial disease: He continues to have stable mild bilateral leg claudication Rutherford class II.   His symptoms do not seem to be lifestyle limiting and thus I recommend continuing medical therapy.  He does have significant erectile dysfunction which is likely due to peripheral arterial disease.  I discussed with him the importance of smoking cessation and also provided him instructions on a walking exercise program that he should initiate. ? ?2.  Tobacco use: I again discussed with him the importance of smoking cessation. ? ?3.  Hyperlipidemia: Continue treatment with atorvastatin.  I reviewed his most recent lipid profile done in August which showed an LDL of 52 which is at target. ? ?4.  Coronary atherosclerosis noted on previous CT scan: Currently with no convincing symptoms of angina. ?  ? ?Disposition:   FU with me in 12 months ? ?Signed, ? ?Kathlyn Sacramento, MD  ?02/17/2022 9:31 AM    ?[place] ?

## 2022-03-23 ENCOUNTER — Encounter (INDEPENDENT_AMBULATORY_CARE_PROVIDER_SITE_OTHER): Payer: Self-pay | Admitting: Ophthalmology

## 2022-03-23 ENCOUNTER — Ambulatory Visit (INDEPENDENT_AMBULATORY_CARE_PROVIDER_SITE_OTHER): Payer: Medicare HMO | Admitting: Ophthalmology

## 2022-03-23 DIAGNOSIS — H2512 Age-related nuclear cataract, left eye: Secondary | ICD-10-CM

## 2022-03-23 DIAGNOSIS — H34811 Central retinal vein occlusion, right eye, with macular edema: Secondary | ICD-10-CM | POA: Diagnosis not present

## 2022-03-23 DIAGNOSIS — H43822 Vitreomacular adhesion, left eye: Secondary | ICD-10-CM

## 2022-03-23 MED ORDER — AFLIBERCEPT 2MG/0.05ML IZ SOLN FOR KALEIDOSCOPE
2.0000 mg | INTRAVITREAL | Status: AC | PRN
  Administered 2022-03-23: 2 mg via INTRAVITREAL

## 2022-03-23 NOTE — Assessment & Plan Note (Signed)
Physiologic OS not impactful at this time

## 2022-03-23 NOTE — Assessment & Plan Note (Signed)
Physiologic OS no impact on acuity

## 2022-03-23 NOTE — Assessment & Plan Note (Signed)
Chronic active disease with recurrences beyond 8-week interval.  With CME decline developing worsening.  Repeat Eylea today to maintain.  Patient notices that the vision is remaining stable.  For PRP in the past for previous peripheral neovascularization

## 2022-03-23 NOTE — Progress Notes (Signed)
03/23/2022     CHIEF COMPLAINT Patient presents for  Chief Complaint  Patient presents with   Central Retinal Vein Occlusion      HISTORY OF PRESENT ILLNESS: Brent Espinoza is a 71 y.o. male who presents to the clinic today for:   HPI   8 weeks for DILATE, OD, EYLEA, OCT. Pt stated vision has been stable since last visit. Pt denies new floaters and FOL.   Last edited by Silvestre Moment on 03/23/2022  8:15 AM.      Referring physician: Carlena Hurl, PA-C [place],  [place] [address]  HISTORICAL INFORMATION:   Selected notes from the MEDICAL RECORD NUMBER    Lab Results  Component Value Date   HGBA1C 6.3 (H) 05/28/2021     CURRENT MEDICATIONS: No current outpatient medications on file. (Ophthalmic Drugs)   No current facility-administered medications for this visit. (Ophthalmic Drugs)   Current Outpatient Medications (Other)  Medication Sig   aspirin EC 81 MG tablet Take 1 tablet (81 mg total) by mouth daily.   atorvastatin (LIPITOR) 40 MG tablet Take 1 tablet (40 mg total) by mouth daily.   metFORMIN (GLUCOPHAGE) 500 MG tablet TAKE 1 TABLET EVERY DAY WITH BREAKFAST   naproxen sodium (ALEVE) 220 MG tablet Take 220 mg by mouth. Takes PRN   No current facility-administered medications for this visit. (Other)      REVIEW OF SYSTEMS: ROS   Negative for: Constitutional, Gastrointestinal, Neurological, Skin, Genitourinary, Musculoskeletal, HENT, Endocrine, Cardiovascular, Eyes, Respiratory, Psychiatric, Allergic/Imm, Heme/Lymph Last edited by Silvestre Moment on 03/23/2022  8:15 AM.       ALLERGIES Allergies  Allergen Reactions   Penicillins     As child     PAST MEDICAL HISTORY Past Medical History:  Diagnosis Date   Abnormal CT of the chest 06/08/2018   Allergy    mild   Arthritis    Atherosclerosis of aorta ([place]) 06/08/2018   Cancer ([place]) 2022   skin cancer   Cataract    removed right eye   Coronary artery disease involving native  heart without angina pectoris 06/08/2018   Diabetes mellitus without complication ([place])    "pre diabetes"   Erectile dysfunction 06/08/2018   Eye disease 02/18/2017   GERD (gastroesophageal reflux disease)    occ   HDL deficiency 02/19/2017   Hyperlipidemia    Impacted cerumen of right ear 02/18/2017   Leukocytosis 02/19/2017   Mixed dyslipidemia 02/19/2017   Overweight 02/18/2017   Smoking    Tobacco use 02/18/2017   Past Surgical History:  Procedure Laterality Date   COLONOSCOPY     EYE SURGERY     gets therapy injections for vessel disease, hx/o cataract surgery   MOUTH SURGERY      FAMILY HISTORY Family History  Problem Relation Age of Onset   Other Mother        eye disease, died of old age   Other Father        health history unknown   Cancer Neg Hx    Diabetes Neg Hx    Heart disease Neg Hx    Stroke Neg Hx    Hyperlipidemia Neg Hx    Hypertension Neg Hx    Colon cancer Neg Hx    Colon polyps Neg Hx    Rectal cancer Neg Hx    Stomach cancer Neg Hx     SOCIAL HISTORY Social History   Tobacco Use   Smoking status: Every Day  Packs/day: 1.00    Years: 50.00    Total pack years: 50.00    Types: Cigarettes   Smokeless tobacco: Never  Vaping Use   Vaping Use: Never used  Substance Use Topics   Alcohol use: Yes    Comment: occ   Drug use: No         OPHTHALMIC EXAM:  Base Eye Exam     Visual Acuity (ETDRS)       Right Left   Dist cc 20/200 20/25 +2   Dist ph cc 20/150     Correction: Glasses         Tonometry (Tonopen, 8:20 AM)       Right Left   Pressure 15 17         Pupils       Pupils APD   Right PERRL None   Left PERRL None         Visual Fields       Left Right    Full Full         Extraocular Movement       Right Left    Full Full         Neuro/Psych     Oriented x3: Yes   Mood/Affect: Normal         Dilation     Right eye: 1.0% Mydriacyl, 2.5% Phenylephrine @ 8:20 AM            Slit Lamp and Fundus Exam     External Exam       Right Left   External Normal Normal         Slit Lamp Exam       Right Left   Lids/Lashes Normal Normal   Conjunctiva/Sclera White and quiet White and quiet   Cornea Clear Clear   Anterior Chamber Deep and quiet Deep and quiet   Iris Round and reactive Round and reactive   Lens Posterior chamber intraocular lens 2+ Nuclear sclerosis   Anterior Vitreous Normal Normal         Fundus Exam       Right Left   Posterior Vitreous Vitreal papillary traction    Disc Collateralization on the nerve, no NVD    C/D Ratio 0.0    Macula Geographic atrophy, Macular thickening, Cystoid macular edema, Microaneurysms, Epiretinal membrane    Vessels Old central retinal vein occlusion, ischemic at present.    Periphery Normal, good PRP 360, room temporal and nasal should more be needed             IMAGING AND PROCEDURES  Imaging and Procedures for 03/23/22  OCT, Retina - OU - Both Eyes       Right Eye Quality was borderline. Scan locations included subfoveal. Central Foveal Thickness: 442. Progression has been stable. Findings include abnormal foveal contour, epiretinal membrane, central retinal atrophy, outer retinal atrophy.   Left Eye Quality was good. Scan locations included subfoveal. Central Foveal Thickness: 311. Progression has been stable. Findings include normal foveal contour, retinal drusen , vitreomacular adhesion .   Notes OD persistent foveal atrophy from longstanding chronic recurrent CME from CRV O.  Stable CME OD at 8-week follow-up today and  thus viable macular retina remains as it is  responsive to treatment with Eylea currently at 8-week.       Intravitreal Injection, Pharmacologic Agent - OD - Right Eye       Time Out 03/23/2022. 9:00 AM. Confirmed correct patient, procedure, site, and  patient consented.   Anesthesia Topical anesthesia was used. Anesthetic medications included Lidocaine 4%.    Procedure Preparation included 5% betadine to ocular surface, 10% betadine to eyelids. A 30 gauge needle was used.   Injection: 2 mg aflibercept 2 MG/0.05ML   Route: Intravitreal, Site: Right Eye   NDC: [id number], Lot: [id number], Waste: 0 mL   Post-op Post injection exam found visual acuity of at least counting fingers. The patient tolerated the procedure well. There were no complications. The patient received written and verbal post procedure care education. Post injection medications included ocuflox.              ASSESSMENT/PLAN:  Vitreomacular adhesion of left eye Physiologic OS no impact on acuity  Central retinal vein occlusion with macular edema of right eye Chronic active disease with recurrences beyond 8-week interval.  With CME decline developing worsening.  Repeat Eylea today to maintain.  Patient notices that the vision is remaining stable.  For PRP in the past for previous peripheral neovascularization  Nuclear sclerotic cataract of left eye Physiologic OS not impactful at this time     ICD-10-CM   1. Central retinal vein occlusion with macular edema of right eye  H34.8110 OCT, Retina - OU - Both Eyes    Intravitreal Injection, Pharmacologic Agent - OD - Right Eye    aflibercept (EYLEA) SOLN 2 mg    2. Vitreomacular adhesion of left eye  H43.822     3. Nuclear sclerotic cataract of left eye  H25.12       1.  OD with center involved CME from central retinal vein occlusion.  Stable at 8 weeks.  Multiple recurrences beyond 9 to 10-week intervals.  Repeat injection today to maintain  2.  OS we will continue to monitor and observe.  3.  Ophthalmic Meds Ordered this visit:  Meds ordered this encounter  Medications   aflibercept (EYLEA) SOLN 2 mg       Return in about 8 weeks (around 05/18/2022) for DILATE OU, EYLEA OCT, OD.  There are no Patient Instructions on file for this visit.   Explained the diagnoses, plan, and follow up with the  patient and they expressed understanding.  Patient expressed understanding of the importance of proper follow up care.   Clent Demark Kalyse Meharg M.D. Diseases & Surgery of the Retina and Vitreous Retina & Diabetic [place] 03/23/22     Abbreviations: M myopia (nearsighted); A astigmatism; H hyperopia (farsighted); P presbyopia; Mrx spectacle prescription;  CTL contact lenses; OD right eye; OS left eye; OU both eyes  XT exotropia; ET esotropia; PEK punctate epithelial keratitis; PEE punctate epithelial erosions; DES dry eye syndrome; MGD meibomian gland dysfunction; ATs artificial tears; PFAT's preservative free artificial tears; [place] nuclear sclerotic cataract; PSC posterior subcapsular cataract; ERM epi-retinal membrane; PVD posterior vitreous detachment; RD retinal detachment; DM diabetes mellitus; DR diabetic retinopathy; NPDR non-proliferative diabetic retinopathy; PDR proliferative diabetic retinopathy; CSME clinically significant macular edema; DME diabetic macular edema; dbh dot blot hemorrhages; CWS cotton wool spot; POAG primary open angle glaucoma; C/D cup-to-disc ratio; HVF humphrey visual field; GVF goldmann visual field; OCT optical coherence tomography; IOP intraocular pressure; BRVO Branch retinal vein occlusion; CRVO central retinal vein occlusion; CRAO central retinal artery occlusion; BRAO branch retinal artery occlusion; RT retinal tear; SB scleral buckle; PPV pars plana vitrectomy; VH Vitreous hemorrhage; PRP panretinal laser photocoagulation; IVK intravitreal kenalog; VMT vitreomacular traction; MH Macular hole;  NVD neovascularization of the disc; NVE neovascularization elsewhere; AREDS age related eye disease  study; ARMD age related macular degeneration; POAG primary open angle glaucoma; EBMD epithelial/anterior basement membrane dystrophy; ACIOL anterior chamber intraocular lens; IOL intraocular lens; PCIOL posterior chamber intraocular lens; Phaco/IOL phacoemulsification with intraocular  lens placement; [place] photorefractive keratectomy; LASIK laser assisted in situ keratomileusis; HTN hypertension; DM diabetes mellitus; COPD chronic obstructive pulmonary disease

## 2022-05-18 ENCOUNTER — Encounter (INDEPENDENT_AMBULATORY_CARE_PROVIDER_SITE_OTHER): Payer: Self-pay | Admitting: Ophthalmology

## 2022-05-18 ENCOUNTER — Ambulatory Visit (INDEPENDENT_AMBULATORY_CARE_PROVIDER_SITE_OTHER): Payer: Medicare HMO | Admitting: Ophthalmology

## 2022-05-18 DIAGNOSIS — H35371 Puckering of macula, right eye: Secondary | ICD-10-CM

## 2022-05-18 DIAGNOSIS — H2512 Age-related nuclear cataract, left eye: Secondary | ICD-10-CM | POA: Diagnosis not present

## 2022-05-18 DIAGNOSIS — H34811 Central retinal vein occlusion, right eye, with macular edema: Secondary | ICD-10-CM

## 2022-05-18 DIAGNOSIS — H43822 Vitreomacular adhesion, left eye: Secondary | ICD-10-CM | POA: Diagnosis not present

## 2022-05-18 MED ORDER — AFLIBERCEPT 2MG/0.05ML IZ SOLN FOR KALEIDOSCOPE
2.0000 mg | INTRAVITREAL | Status: AC | PRN
  Administered 2022-05-18: 2 mg via INTRAVITREAL

## 2022-05-18 NOTE — Progress Notes (Signed)
05/18/2022     CHIEF COMPLAINT Patient presents for  Chief Complaint  Patient presents with   Central Retinal Vein Occlusion      HISTORY OF PRESENT ILLNESS: Brent Espinoza is a 71 y.o. male who presents to the clinic today for:   HPI   8 weeks for DILATE OU, EYLEA OCT, OD. Pt stated no changes in vision since last visit.  Last edited by Silvestre Moment on 05/18/2022  8:40 AM.      Referring physician: Carlena Hurl, PA-C [place],  [place] [address]  HISTORICAL INFORMATION:   Selected notes from the MEDICAL RECORD NUMBER    Lab Results  Component Value Date   HGBA1C 6.3 (H) 05/28/2021     CURRENT MEDICATIONS: No current outpatient medications on file. (Ophthalmic Drugs)   No current facility-administered medications for this visit. (Ophthalmic Drugs)   Current Outpatient Medications (Other)  Medication Sig   aspirin EC 81 MG tablet Take 1 tablet (81 mg total) by mouth daily.   atorvastatin (LIPITOR) 40 MG tablet Take 1 tablet (40 mg total) by mouth daily.   metFORMIN (GLUCOPHAGE) 500 MG tablet TAKE 1 TABLET EVERY DAY WITH BREAKFAST   naproxen sodium (ALEVE) 220 MG tablet Take 220 mg by mouth. Takes PRN   No current facility-administered medications for this visit. (Other)      REVIEW OF SYSTEMS: ROS   Negative for: Constitutional, Gastrointestinal, Neurological, Skin, Genitourinary, Musculoskeletal, HENT, Endocrine, Cardiovascular, Eyes, Respiratory, Psychiatric, Allergic/Imm, Heme/Lymph Last edited by Silvestre Moment on 05/18/2022  8:40 AM.       ALLERGIES Allergies  Allergen Reactions   Penicillins     As child     PAST MEDICAL HISTORY Past Medical History:  Diagnosis Date   Abnormal CT of the chest 06/08/2018   Allergy    mild   Arthritis    Atherosclerosis of aorta ([place]) 06/08/2018   Cancer ([place]) 2022   skin cancer   Cataract    removed right eye   Coronary artery disease involving native heart without angina pectoris 06/08/2018    Diabetes mellitus without complication ([place])    "pre diabetes"   Erectile dysfunction 06/08/2018   Eye disease 02/18/2017   GERD (gastroesophageal reflux disease)    occ   HDL deficiency 02/19/2017   Hyperlipidemia    Impacted cerumen of right ear 02/18/2017   Leukocytosis 02/19/2017   Mixed dyslipidemia 02/19/2017   Overweight 02/18/2017   Smoking    Tobacco use 02/18/2017   Past Surgical History:  Procedure Laterality Date   COLONOSCOPY     EYE SURGERY     gets therapy injections for vessel disease, hx/o cataract surgery   MOUTH SURGERY      FAMILY HISTORY Family History  Problem Relation Age of Onset   Other Mother        eye disease, died of old age   Other Father        health history unknown   Cancer Neg Hx    Diabetes Neg Hx    Heart disease Neg Hx    Stroke Neg Hx    Hyperlipidemia Neg Hx    Hypertension Neg Hx    Colon cancer Neg Hx    Colon polyps Neg Hx    Rectal cancer Neg Hx    Stomach cancer Neg Hx     SOCIAL HISTORY Social History   Tobacco Use   Smoking status: Every Day    Packs/day: 1.00  Years: 50.00    Total pack years: 50.00    Types: Cigarettes   Smokeless tobacco: Never  Vaping Use   Vaping Use: Never used  Substance Use Topics   Alcohol use: Yes    Comment: occ   Drug use: No         OPHTHALMIC EXAM:  Base Eye Exam     Visual Acuity (ETDRS)       Right Left   Dist cc 20/200 -1 20/25 +1   Dist ph cc 20/150 -1     Correction: Glasses         Tonometry (Tonopen, 8:44 AM)       Right Left   Pressure 17 18         Pupils       Pupils APD   Right PERRL None   Left PERRL None         Visual Fields       Left Right    Full    Restrictions  Partial outer inferior temporal, inferior nasal deficiencies         Extraocular Movement       Right Left    Full Full         Neuro/Psych     Oriented x3: Yes   Mood/Affect: Normal         Dilation     Both eyes: 1.0% Mydriacyl, 2.5%  Phenylephrine @ 8:44 AM           Slit Lamp and Fundus Exam     External Exam       Right Left   External Normal Normal         Slit Lamp Exam       Right Left   Lids/Lashes Normal Normal   Conjunctiva/Sclera White and quiet White and quiet   Cornea Clear Clear   Anterior Chamber Deep and quiet Deep and quiet   Iris Round and reactive Round and reactive   Lens Posterior chamber intraocular lens 2+ Nuclear sclerosis   Anterior Vitreous Normal Normal         Fundus Exam       Right Left   Posterior Vitreous Vitreal papillary traction Posterior vitreous detachment   Disc Collateralization on the nerve, no NVD Normal   C/D Ratio 0.0 0.0   Macula Geographic atrophy, Macular thickening, Cystoid macular edema, Microaneurysms, Epiretinal membrane Early age related macular degeneration, Hard drusen   Vessels Old central retinal vein occlusion, ischemic at present. Normal   Periphery Normal, good PRP 360, room temporal and nasal should more be needed Normal            IMAGING AND PROCEDURES  Imaging and Procedures for 05/18/22  OCT, Retina - OU - Both Eyes       Right Eye Quality was borderline. Scan locations included subfoveal. Central Foveal Thickness: 403. Progression has been stable. Findings include abnormal foveal contour, epiretinal membrane, central retinal atrophy, outer retinal atrophy.   Left Eye Quality was good. Scan locations included subfoveal. Central Foveal Thickness: 311. Progression has been stable. Findings include normal foveal contour, retinal drusen , vitreomacular adhesion .   Notes OD persistent foveal atrophy from longstanding chronic recurrent CME from CRV O.  Stable CME OD at 8-week follow-up today and  thus viable macular retina remains as it is  responsive to treatment with Eylea currently at 8-week.         Intravitreal Injection, Pharmacologic Agent - OD - Right  Eye       Time Out 05/18/2022. 9:15 AM. Confirmed correct  patient, procedure, site, and patient consented.   Anesthesia Topical anesthesia was used. Anesthetic medications included Lidocaine 4%.   Procedure Preparation included 5% betadine to ocular surface, 10% betadine to eyelids. A 30 gauge needle was used.   Injection: 2 mg aflibercept 2 MG/0.05ML   Route: Intravitreal, Site: Right Eye   NDC: [id number], Lot: [id number], Expiration date: 06/14/2023, Waste: 0 mL   Post-op Post injection exam found visual acuity of at least counting fingers. The patient tolerated the procedure well. There were no complications. The patient received written and verbal post procedure care education. Post injection medications included ocuflox.              ASSESSMENT/PLAN:  Nuclear sclerotic cataract of left eye Good vision left eye, no impact on acuity monitor  Vitreomacular adhesion of left eye Physiologic OS stable no impact on acuity no impact on foveal architecture  Central retinal vein occlusion with macular edema of right eye Chronic CME controlled at 8-week evaluation and examination with delivery of Eylea.  Improved and stable acuity also.  Macular pucker, right eye Minor OD no impact on acuity     ICD-10-CM   1. Central retinal vein occlusion with macular edema of right eye  H34.8110 OCT, Retina - OU - Both Eyes    Intravitreal Injection, Pharmacologic Agent - OD - Right Eye    aflibercept (EYLEA) SOLN 2 mg    2. Nuclear sclerotic cataract of left eye  H25.12     3. Vitreomacular adhesion of left eye  H43.822     4. Macular pucker, right eye  H35.371       1.  OD, with CME secondary to chronic ischemic CRV O despite good peripheral PRP.  Repeat injection today to maintain ambulatory vision in the right eye.  2.  OS minor cataract continue to observe  3.  Ophthalmic Meds Ordered this visit:  Meds ordered this encounter  Medications   aflibercept (EYLEA) SOLN 2 mg       Return in about 8 weeks (around 07/13/2022) for  dilate, OD, EYLEA OCT.  There are no Patient Instructions on file for this visit.   Explained the diagnoses, plan, and follow up with the patient and they expressed understanding.  Patient expressed understanding of the importance of proper follow up care.   Clent Demark Agamjot Kilgallon M.D. Diseases & Surgery of the Retina and Vitreous Retina & Diabetic [place] 05/18/22     Abbreviations: M myopia (nearsighted); A astigmatism; H hyperopia (farsighted); P presbyopia; Mrx spectacle prescription;  CTL contact lenses; OD right eye; OS left eye; OU both eyes  XT exotropia; ET esotropia; PEK punctate epithelial keratitis; PEE punctate epithelial erosions; DES dry eye syndrome; MGD meibomian gland dysfunction; ATs artificial tears; PFAT's preservative free artificial tears; [place] nuclear sclerotic cataract; PSC posterior subcapsular cataract; ERM epi-retinal membrane; PVD posterior vitreous detachment; RD retinal detachment; DM diabetes mellitus; DR diabetic retinopathy; NPDR non-proliferative diabetic retinopathy; PDR proliferative diabetic retinopathy; CSME clinically significant macular edema; DME diabetic macular edema; dbh dot blot hemorrhages; CWS cotton wool spot; POAG primary open angle glaucoma; C/D cup-to-disc ratio; HVF humphrey visual field; GVF goldmann visual field; OCT optical coherence tomography; IOP intraocular pressure; BRVO Branch retinal vein occlusion; CRVO central retinal vein occlusion; CRAO central retinal artery occlusion; BRAO branch retinal artery occlusion; RT retinal tear; SB scleral buckle; PPV pars plana vitrectomy; VH Vitreous hemorrhage; PRP panretinal laser photocoagulation;  IVK intravitreal kenalog; VMT vitreomacular traction; MH Macular hole;  NVD neovascularization of the disc; NVE neovascularization elsewhere; AREDS age related eye disease study; ARMD age related macular degeneration; POAG primary open angle glaucoma; EBMD epithelial/anterior basement membrane dystrophy; ACIOL  anterior chamber intraocular lens; IOL intraocular lens; PCIOL posterior chamber intraocular lens; Phaco/IOL phacoemulsification with intraocular lens placement; [place] photorefractive keratectomy; LASIK laser assisted in situ keratomileusis; HTN hypertension; DM diabetes mellitus; COPD chronic obstructive pulmonary disease

## 2022-05-18 NOTE — Assessment & Plan Note (Signed)
Minor OD no impact on acuity

## 2022-05-18 NOTE — Assessment & Plan Note (Signed)
Physiologic OS stable no impact on acuity no impact on foveal architecture

## 2022-05-18 NOTE — Assessment & Plan Note (Signed)
Chronic CME controlled at 8-week evaluation and examination with delivery of Eylea.  Improved and stable acuity also.

## 2022-05-18 NOTE — Assessment & Plan Note (Signed)
Good vision left eye, no impact on acuity monitor

## 2022-05-29 ENCOUNTER — Encounter: Payer: Self-pay | Admitting: Medical

## 2022-05-29 ENCOUNTER — Ambulatory Visit (INDEPENDENT_AMBULATORY_CARE_PROVIDER_SITE_OTHER): Payer: Medicare HMO | Admitting: Medical

## 2022-05-29 VITALS — BP 118/62 | HR 62 | Ht 69.0 in | Wt 161.2 lb

## 2022-05-29 DIAGNOSIS — E782 Mixed hyperlipidemia: Secondary | ICD-10-CM

## 2022-05-29 DIAGNOSIS — H34811 Central retinal vein occlusion, right eye, with macular edema: Secondary | ICD-10-CM

## 2022-05-29 DIAGNOSIS — Z0000 Encounter for general adult medical examination without abnormal findings: Secondary | ICD-10-CM

## 2022-05-29 DIAGNOSIS — F419 Anxiety disorder, unspecified: Secondary | ICD-10-CM

## 2022-05-29 DIAGNOSIS — Z7189 Other specified counseling: Secondary | ICD-10-CM

## 2022-05-29 DIAGNOSIS — R7301 Impaired fasting glucose: Secondary | ICD-10-CM

## 2022-05-29 DIAGNOSIS — Z720 Tobacco use: Secondary | ICD-10-CM

## 2022-05-29 DIAGNOSIS — I739 Peripheral vascular disease, unspecified: Secondary | ICD-10-CM | POA: Diagnosis not present

## 2022-05-29 DIAGNOSIS — I700 Atherosclerosis of aorta: Secondary | ICD-10-CM

## 2022-05-29 DIAGNOSIS — H353114 Nonexudative age-related macular degeneration, right eye, advanced atrophic with subfoveal involvement: Secondary | ICD-10-CM

## 2022-05-29 DIAGNOSIS — Z122 Encounter for screening for malignant neoplasm of respiratory organs: Secondary | ICD-10-CM | POA: Insufficient documentation

## 2022-05-29 DIAGNOSIS — I2510 Atherosclerotic heart disease of native coronary artery without angina pectoris: Secondary | ICD-10-CM

## 2022-05-29 DIAGNOSIS — Z7185 Encounter for immunization safety counseling: Secondary | ICD-10-CM

## 2022-05-29 NOTE — Addendum Note (Signed)
Addended by: Carlena Hurl on: 05/29/2022 10:34 AM   Modules accepted: Orders

## 2022-05-29 NOTE — Progress Notes (Addendum)
Subjective:    Brent Espinoza is a 71 y.o. male who presents for Preventative Services visit and chronic medical problems/med check visit.    Primary Care Provider Tysinger, Camelia Eng, PA-C here for primary care  Current Health Care Team: Dentist, [place] Dr. [place] Cellar, GI Dr. Deloria Lair, Dr. Sharyne Peach, ophthalomology Dr. Kathlyn [place], cardiology Dr. Allyn Kenner, dermatology   Medical Services you may have received from other than [facility] providers in the past year (date may be approximate) Dermatology- Dr. [place] Crane  Exercise Current exercise habits:  couple times a week  mowing yard, walks regularly.  Nutrition/Diet Current diet: well balanced  Depression Screen    05/29/2022    9:45 AM  Depression screen PHQ 2/9  Decreased Interest 1  Down, Depressed, Hopeless 0  PHQ - 2 Score 1    Activities of Daily Living Screen/Functional Status Survey Is the patient deaf or have difficulty hearing?: No Does the patient have difficulty seeing, even when wearing glasses/contacts?: No Does the patient have difficulty concentrating, remembering, or making decisions?: No Does the patient have difficulty walking or climbing stairs?: No Does the patient have difficulty dressing or bathing?: No Does the patient have difficulty doing errands alone such as visiting a doctor's office or shopping?: No  Can patient draw a clock face showing 3:15 oclock, yes  Fall Risk Screen    05/29/2022    9:45 AM 05/28/2021   10:22 AM 07/19/2019   10:13 AM 02/22/2018    1:01 PM 02/18/2017    9:53 AM  [place] in the past year? 0 0 0 No No  Number falls in past yr: 0 0     Injury with Fall? 0 0     Risk for fall due to : No Fall Risks No Fall Risks     Follow up Falls evaluation completed Falls evaluation completed       Gait Assessment: Normal gait observed yes  Advanced directives Does patient have a [place]? No Does patient have a Living  Will? No   Past Medical History:  Diagnosis Date   Abnormal CT of the chest 06/08/2018   Allergy    mild   Arthritis    Atherosclerosis of aorta ([place]) 06/08/2018   Cancer ([place]) 2022   skin cancer   Cataract    removed right eye   Coronary artery disease involving native heart without angina pectoris 06/08/2018   Diabetes mellitus without complication ([place])    "pre diabetes"   Erectile dysfunction 06/08/2018   Eye disease 02/18/2017   GERD (gastroesophageal reflux disease)    occ   HDL deficiency 02/19/2017   Hyperlipidemia    Impacted cerumen of right ear 02/18/2017   Leukocytosis 02/19/2017   Mixed dyslipidemia 02/19/2017   Overweight 02/18/2017   Smoking    Tobacco use 02/18/2017    Past Surgical History:  Procedure Laterality Date   COLONOSCOPY     EYE SURGERY     gets therapy injections for vessel disease, hx/o cataract surgery   MOUTH SURGERY      Social History   Socioeconomic History   Marital status: Divorced    Spouse name: Not on file   Number of children: Not on file   Years of education: Not on file   Highest education level: Not on file  Occupational History   Not on file  Tobacco Use   Smoking status: Every Day    Packs/day:  1.00    Years: 50.00    Total pack years: 50.00    Types: Cigarettes   Smokeless tobacco: Never  Vaping Use   Vaping Use: Never used  Substance and Sexual Activity   Alcohol use: Yes    Comment: occ   Drug use: No   Sexual activity: Not on file  Other Topics Concern   Not on file  Social History Narrative   Alone.   Retired as of 2018.   Was at [employer].   Exercise - yard work, Stage manager around, walks.  Eats relatively healthy.   Has 2 children, 1 in [place], 1 in [place], 4 grandchildren.  Talks with his child in [place] every week.  05/2022   Social Determinants of Health   Financial Resource Strain: Not on file  Food Insecurity: Not on file  Transportation Needs: Not on file  Physical Activity: Not on file   Stress: Not on file  Social Connections: Not on file  Intimate Partner Violence: Not on file    Family History  Problem Relation Age of Onset   Other Mother        eye disease, died of old age   Other Father        health history unknown   Cancer Neg Hx    Diabetes Neg Hx    Heart disease Neg Hx    Stroke Neg Hx    Hyperlipidemia Neg Hx    Hypertension Neg Hx    Colon cancer Neg Hx    Colon polyps Neg Hx    Rectal cancer Neg Hx    Stomach cancer Neg Hx      Current Outpatient Medications:    aspirin EC 81 MG tablet, Take 1 tablet (81 mg total) by mouth daily., Disp: 90 tablet, Rfl: 3   atorvastatin (LIPITOR) 40 MG tablet, Take 1 tablet (40 mg total) by mouth daily., Disp: 90 tablet, Rfl: 3   metFORMIN (GLUCOPHAGE) 500 MG tablet, TAKE 1 TABLET EVERY DAY WITH BREAKFAST, Disp: 90 tablet, Rfl: 0   naproxen sodium (ALEVE) 220 MG tablet, Take 220 mg by mouth. Takes PRN, Disp: , Rfl:   Allergies  Allergen Reactions   Penicillins     As child     History reviewed: allergies, current medications, past family history, past medical history, past social history, past surgical history and problem list  Chronic issues discussed: Hyperlipidemia-he is compliant with Lipitor 40 mg daily and aspirin daily  Impaired glucose-compliant with metformin 1000 mg daily  Acute issues discussed: No new issues  Objective:   Biometrics BP 118/62   Pulse 62   Ht '5\' 9"'$  (1.753 m)   Wt 161 lb 3.2 oz (73.1 kg)   BMI 23.81 kg/m   Wt Readings from Last 3 Encounters:  05/29/22 161 lb 3.2 oz (73.1 kg)  02/17/22 162 lb 12.8 oz (73.8 kg)  07/30/21 170 lb (77.1 kg)   Gen: wd, wn nad, tobacco odor Skin: Scattered macules, no worrisome lesions HEENT: normocephalic, sclerae anicteric, TMs pearly, nares patent, no discharge or erythema, pharynx normal Oral cavity: MMM, no lesions Neck: supple, no lymphadenopathy, no thyromegaly, no masses, no bruits Heart: RRR, normal S1, S2, no murmurs Lungs:  CTA bilaterally, no wheezes, rhonchi, or rales Abdomen: +bs, soft, non tender, non distended, no masses, no hepatomegaly, no splenomegaly Musculoskeletal: nontender, no swelling, no obvious deformity Extremities: no edema, no cyanosis, no clubbing Pulses: 2+ symmetric, upper and 1+ lower extremities, normal cap refill Neurological: alert, oriented x  3, CN2-12 intact, strength normal upper extremities and lower extremities, sensation normal throughout, DTRs 2+ throughout, no cerebellar signs, gait normal Psychiatric: normal affect, behavior normal, pleasant  GU/rectal - deferred/declined   Assessment:   Encounter Diagnoses  Name Primary?   Encounter for health maintenance examination in adult Yes   Impaired fasting blood sugar    Medicare annual wellness visit, subsequent    Mixed dyslipidemia    PAD (peripheral artery disease) ([place])    Tobacco use    Vaccine counseling    Advance directive discussed with patient    Advanced nonexudative age-related macular degeneration of right eye with subfoveal involvement    Anxiety    Atherosclerosis of aorta (HCC)    Central retinal vein occlusion with macular edema of right eye    Coronary artery disease involving native coronary artery of native heart without angina pectoris    Screening for lung cancer      Plan:     This visit was a preventative care visit, also known as wellness visit or routine physical.   Topics typically include healthy lifestyle, diet, exercise, preventative care, vaccinations, sick and well care, proper use of emergency dept and after hours care, as well as other concerns.     Recommendations: Continue to return yearly for your annual wellness and preventative care visits.  This gives [place] a chance to discuss healthy lifestyle, exercise, vaccinations, review your chart record, and perform screenings where appropriate.  I recommend you see your eye doctor yearly for routine vision care.  I recommend you see your  dentist yearly for routine dental care including hygiene visits twice yearly.   Vaccination recommendations were reviewed Immunization History  Administered Date(s) Administered   Fluad Quad(high Dose 65+) 06/13/2019   Influenza Split 10/10/2013   Influenza, High Dose Seasonal PF 07/13/2017, 08/01/2018, 08/01/2018   Influenza,inj,Quad PF,6+ Mos 08/21/2014   Influenza-Unspecified 06/24/2016, 06/30/2021   PFIZER Comirnaty(Gray Top)Covid-19 Tri-Sucrose Vaccine 01/18/2020, 02/12/2020, 09/19/2020   Pneumococcal Conjugate-13 06/24/2016    I recommend a flu shot yearly in the fall  I recommend you get an updated tetanus and shingles vaccine at the pharmacy  We will contact your pharmacy about prior pneumococcal vaccine the think you may have had in the last 6 years    Screening for cancer: Colon cancer screening: I reviewed your colonoscopy on file that is up to date from 07/2021.  Skin cancer screening: Check your skin regularly for new changes, growing lesions, or other lesions of concern Come in for evaluation if you have skin lesions of concern. Continue routine follow-up with dermatology  Lung cancer screening: If you have a greater than 20 pack year history of tobacco use, then you may qualify for lung cancer screening with a chest CT scan.   Please call your insurance company to inquire about coverage for this test. I reviewed your CT chest from last year.  It is recommended to have a yearly CT lung cancer screening.  If you want to do this let me know and I will put in the order  We currently don't have screenings for other cancers besides breast, cervical, colon, and lung cancers.  If you have a strong family history of cancer or have other cancer screening concerns, please let me know.    Bone health: Get at least 150 minutes of aerobic exercise weekly Get weight bearing exercise at least once weekly Bone density test:  A bone density test is an imaging test that uses a  type  of X-ray to measure the amount of calcium and other minerals in your bones. The test may be used to diagnose or screen you for a condition that causes weak or thin bones (osteoporosis), predict your risk for a broken bone (fracture), or determine how well your osteoporosis treatment is working. The bone density test is recommended for females 69 and older, or females or males <93 if certain risk factors such as thyroid disease, long term use of steroids such as for asthma or rheumatological issues, vitamin D deficiency, estrogen deficiency, family history of osteoporosis, self or family history of fragility fracture in first degree relative.    Heart health: Get at least 150 minutes of aerobic exercise weekly Limit alcohol It is important to maintain a healthy blood pressure and healthy cholesterol numbers  Heart disease screening: Screening for heart disease includes screening for blood pressure, fasting lipids, glucose/diabetes screening, BMI height to weight ratio, reviewed of smoking status, physical activity, and diet.    Goals include blood pressure 120/80 or less, maintaining a healthy lipid/cholesterol profile, preventing diabetes or keeping diabetes numbers under good control, not smoking or using tobacco products, exercising most days per week or at least 150 minutes per week of exercise, and eating healthy variety of fruits and vegetables, healthy oils, and avoiding unhealthy food choices like fried food, fast food, high sugar and high cholesterol foods.    Continue routine follow-up with cardiology.  I reviewed your May 2023 notes.  You do have decreased platelets in the legs and stents have been recommended.  I know you are using a watch and wait approach on this at this point.  When you see cardiology next year asked about doing an updated ultrasound of the abdomen for screening for aneurysm.    Medical care options: I recommend you continue to seek care here first for routine  care.  We try really hard to have available appointments Monday through Friday daytime hours for sick visits, acute visits, and physicals.  Urgent care should be used for after hours and weekends for significant issues that cannot wait till the next day.  The emergency department should be used for significant potentially life-threatening emergencies.  The emergency department is expensive, can often have long wait times for less significant concerns, so try to utilize primary care, urgent care, or telemedicine when possible to avoid unnecessary trips to the emergency department.  Virtual visits and telemedicine have been introduced since the pandemic started in 2020, and can be convenient ways to receive medical care.  We offer virtual appointments as well to assist you in a variety of options to seek medical care.    Separate significant issues discussed: Vein occlusion and eye issues-continue routine follow-up with ophthalmology every 8 weeks like you are doing  Hyperlipidemia-continue cholesterol medication daily.  Labs today.  Eat a healthy low-cholesterol diet  Peripheral arterial disease-I strongly recommend you quit smoking.  Continue walking for exercise.  Continue aspirin and cholesterol pill daily  Impaired glucose, prediabetes-updated labs today  Atherosclerosis of aorta-continue cholesterol medication aspirin daily and I recommend you quit smoking  I recommend a urinalysis for screening for kidney disease but he declines urinalysis today.  He notes that he lost weight intentionally in recent months.  He feels better with a little less weight on him.  [place] was seen today for nonfasting cpe.  Diagnoses and all orders for this visit:  Encounter for health maintenance examination in adult -     Comprehensive metabolic panel -  CBC -     Lipid panel -     Hemoglobin A1c -     Cancel: POCT Urinalysis DIP (Proadvantage Device)  Impaired fasting blood sugar -     Hemoglobin  A1c  Medicare annual wellness visit, subsequent  Mixed dyslipidemia -     Lipid panel  PAD (peripheral artery disease) ([place])  Tobacco use  Vaccine counseling  Advance directive discussed with patient  Advanced nonexudative age-related macular degeneration of right eye with subfoveal involvement  Anxiety  Atherosclerosis of aorta ([facility])  Central retinal vein occlusion with macular edema of right eye  Coronary artery disease involving native coronary artery of native heart without angina pectoris  Screening for lung cancer -     CT CHEST LUNG CA SCREEN LOW DOSE W/O CM; Future           Medicare Attestation A preventative services visit was completed today.  During the course of the visit the patient was educated and counseled about appropriate screening and preventive services.  A health risk assessment was established with the patient that included a review of current medications, allergies, social history, family history, medical and preventative health history, biometrics, and preventative screenings to identify potential safety concerns or impairments.  A personalized plan was printed today for the patient's records and use.   Personalized health advice and education was given today to reduce health risks and promote self management and wellness.  Information regarding end of life planning was discussed today.  Dorothea Ogle, PA-C   05/29/2022

## 2022-05-29 NOTE — Patient Instructions (Signed)
This visit was a preventative care visit, also known as wellness visit or routine physical.   Topics typically include healthy lifestyle, diet, exercise, preventative care, vaccinations, sick and well care, proper use of emergency dept and after hours care, as well as other concerns.     Recommendations: Continue to return yearly for your annual wellness and preventative care visits.  This gives [place] a chance to discuss healthy lifestyle, exercise, vaccinations, review your chart record, and perform screenings where appropriate.  I recommend you see your eye doctor yearly for routine vision care.  I recommend you see your dentist yearly for routine dental care including hygiene visits twice yearly.   Vaccination recommendations were reviewed Immunization History  Administered Date(s) Administered   Fluad Quad(high Dose 65+) 06/13/2019   Influenza Split 10/10/2013   Influenza, High Dose Seasonal PF 07/13/2017, 08/01/2018, 08/01/2018   Influenza,inj,Quad PF,6+ Mos 08/21/2014   Influenza-Unspecified 06/24/2016, 06/30/2021   PFIZER Comirnaty(Gray Top)Covid-19 Tri-Sucrose Vaccine 01/18/2020, 02/12/2020, 09/19/2020   Pneumococcal Conjugate-13 06/24/2016    I recommend a flu shot yearly in the fall  I recommend you get an updated tetanus and shingles vaccine at the pharmacy  We will contact your pharmacy about prior pneumococcal vaccine the think you may have had in the last 6 years    Screening for cancer: Colon cancer screening: I reviewed your colonoscopy on file that is up to date from 07/2021.  Skin cancer screening: Check your skin regularly for new changes, growing lesions, or other lesions of concern Come in for evaluation if you have skin lesions of concern. Continue routine follow-up with dermatology  Lung cancer screening: If you have a greater than 20 pack year history of tobacco use, then you may qualify for lung cancer screening with a chest CT scan.   Please call  your insurance company to inquire about coverage for this test. I reviewed your CT chest from last year.  It is recommended to have a yearly CT lung cancer screening.  If you want to do this let me know and I will put in the order  We currently don't have screenings for other cancers besides breast, cervical, colon, and lung cancers.  If you have a strong family history of cancer or have other cancer screening concerns, please let me know.    Bone health: Get at least 150 minutes of aerobic exercise weekly Get weight bearing exercise at least once weekly Bone density test:  A bone density test is an imaging test that uses a type of X-ray to measure the amount of calcium and other minerals in your bones. The test may be used to diagnose or screen you for a condition that causes weak or thin bones (osteoporosis), predict your risk for a broken bone (fracture), or determine how well your osteoporosis treatment is working. The bone density test is recommended for females 60 and older, or females or males <16 if certain risk factors such as thyroid disease, long term use of steroids such as for asthma or rheumatological issues, vitamin D deficiency, estrogen deficiency, family history of osteoporosis, self or family history of fragility fracture in first degree relative.    Heart health: Get at least 150 minutes of aerobic exercise weekly Limit alcohol It is important to maintain a healthy blood pressure and healthy cholesterol numbers  Heart disease screening: Screening for heart disease includes screening for blood pressure, fasting lipids, glucose/diabetes screening, BMI height to weight ratio, reviewed of smoking status, physical activity, and diet.  Goals include blood pressure 120/80 or less, maintaining a healthy lipid/cholesterol profile, preventing diabetes or keeping diabetes numbers under good control, not smoking or using tobacco products, exercising most days per week or at least  150 minutes per week of exercise, and eating healthy variety of fruits and vegetables, healthy oils, and avoiding unhealthy food choices like fried food, fast food, high sugar and high cholesterol foods.    Continue routine follow-up with cardiology.  I reviewed your May 2023 notes.  You do have decreased platelets in the legs and stents have been recommended.  I know you are using a watch and wait approach on this at this point.  When you see cardiology next year asked about doing an updated ultrasound of the abdomen for screening for aneurysm.    Medical care options: I recommend you continue to seek care here first for routine care.  We try really hard to have available appointments Monday through Friday daytime hours for sick visits, acute visits, and physicals.  Urgent care should be used for after hours and weekends for significant issues that cannot wait till the next day.  The emergency department should be used for significant potentially life-threatening emergencies.  The emergency department is expensive, can often have long wait times for less significant concerns, so try to utilize primary care, urgent care, or telemedicine when possible to avoid unnecessary trips to the emergency department.  Virtual visits and telemedicine have been introduced since the pandemic started in 2020, and can be convenient ways to receive medical care.  We offer virtual appointments as well to assist you in a variety of options to seek medical care.    Separate significant issues discussed: Vein occlusion and eye issues-continue routine follow-up with ophthalmology every 8 weeks like you are doing  Hyperlipidemia-continue cholesterol medication daily.  Labs today.  Eat a healthy low-cholesterol diet  Peripheral arterial disease-I strongly recommend you quit smoking.  Continue walking for exercise.  Continue aspirin and cholesterol pill daily  Impaired glucose, prediabetes-updated labs  today  Atherosclerosis of aorta-continue cholesterol medication aspirin daily and I recommend you quit smoking

## 2022-05-30 LAB — COMPREHENSIVE METABOLIC PANEL
ALT: 16 IU/L (ref 0–44)
AST: 18 IU/L (ref 0–40)
Albumin/Globulin Ratio: 1.4 (ref 1.2–2.2)
Albumin: 4.6 g/dL (ref 3.8–4.8)
Alkaline Phosphatase: 109 IU/L (ref 44–121)
BUN/Creatinine Ratio: 14 (ref 10–24)
BUN: 13 mg/dL (ref 8–27)
Bilirubin Total: 0.3 mg/dL (ref 0.0–1.2)
CO2: 25 mmol/L (ref 20–29)
Calcium: 10 mg/dL (ref 8.6–10.2)
Chloride: 99 mmol/L (ref 96–106)
Creatinine, Ser: 0.94 mg/dL (ref 0.76–1.27)
Globulin, Total: 3.2 g/dL (ref 1.5–4.5)
Glucose: 96 mg/dL (ref 70–99)
Potassium: 4.3 mmol/L (ref 3.5–5.2)
Sodium: 140 mmol/L (ref 134–144)
Total Protein: 7.8 g/dL (ref 6.0–8.5)
eGFR: 86 mL/min/{1.73_m2} (ref >59)

## 2022-05-30 LAB — LIPID PANEL
Chol/HDL Ratio: 2.9 ratio (ref 0.0–5.0)
Cholesterol, Total: 97 mg/dL — ABNORMAL LOW (ref 100–199)
HDL: 34 mg/dL — ABNORMAL LOW (ref >39)
LDL Chol Calc (NIH): 49 mg/dL (ref 0–99)
Triglycerides: 60 mg/dL (ref 0–149)
VLDL Cholesterol Cal: 14 mg/dL (ref 5–40)

## 2022-05-30 LAB — CBC
Hematocrit: 48 % (ref 37.5–51.0)
Hemoglobin: 16.3 g/dL (ref 13.0–17.7)
MCH: 29.8 pg (ref 26.6–33.0)
MCHC: 34 g/dL (ref 31.5–35.7)
MCV: 88 fL (ref 79–97)
Platelets: 299 10*3/uL (ref 150–450)
RBC: 5.47 x10E6/uL (ref 4.14–5.80)
RDW: 13.2 % (ref 11.6–15.4)
WBC: 11.7 10*3/uL — ABNORMAL HIGH (ref 3.4–10.8)

## 2022-05-30 LAB — HEMOGLOBIN A1C
Est. average glucose Bld gHb Est-mCnc: 131 mg/dL
Hgb A1c MFr Bld: 6.2 % — ABNORMAL HIGH (ref 4.8–5.6)

## 2022-06-01 ENCOUNTER — Other Ambulatory Visit: Payer: Self-pay | Admitting: Medical

## 2022-06-01 MED ORDER — ASPIRIN 81 MG PO TBEC: 81.0000 mg | DELAYED_RELEASE_TABLET | Freq: Every day | ORAL | 3 refills | Status: DC

## 2022-06-01 MED ORDER — METFORMIN HCL 500 MG PO TABS: ORAL_TABLET | ORAL | 3 refills | Status: DC

## 2022-06-17 ENCOUNTER — Encounter: Payer: Self-pay | Admitting: Internal Medicine

## 2022-06-26 ENCOUNTER — Ambulatory Visit
Admission: RE | Admit: 2022-06-26 | Discharge: 2022-06-26 | Disposition: A | Discharge status: Home / Self Care | Payer: Medicare HMO | Source: Ambulatory Visit | Attending: Medical | Admitting: Medical

## 2022-06-26 DIAGNOSIS — Z122 Encounter for screening for malignant neoplasm of respiratory organs: Secondary | ICD-10-CM

## 2022-06-26 DIAGNOSIS — F17210 Nicotine dependence, cigarettes, uncomplicated: Secondary | ICD-10-CM | POA: Diagnosis not present

## 2022-07-06 ENCOUNTER — Ambulatory Visit (INDEPENDENT_AMBULATORY_CARE_PROVIDER_SITE_OTHER): Payer: Medicare HMO | Admitting: Ophthalmology

## 2022-07-06 ENCOUNTER — Encounter (INDEPENDENT_AMBULATORY_CARE_PROVIDER_SITE_OTHER): Payer: Self-pay | Admitting: Ophthalmology

## 2022-07-06 DIAGNOSIS — H2512 Age-related nuclear cataract, left eye: Secondary | ICD-10-CM

## 2022-07-06 DIAGNOSIS — H35371 Puckering of macula, right eye: Secondary | ICD-10-CM

## 2022-07-06 DIAGNOSIS — H353114 Nonexudative age-related macular degeneration, right eye, advanced atrophic with subfoveal involvement: Secondary | ICD-10-CM | POA: Diagnosis not present

## 2022-07-06 DIAGNOSIS — H34811 Central retinal vein occlusion, right eye, with macular edema: Secondary | ICD-10-CM | POA: Diagnosis not present

## 2022-07-06 MED ORDER — AFLIBERCEPT 2MG/0.05ML IZ SOLN FOR KALEIDOSCOPE
2.0000 mg | INTRAVITREAL | Status: AC | PRN
  Administered 2022-07-06: 2 mg via INTRAVITREAL

## 2022-07-06 NOTE — Progress Notes (Signed)
07/06/2022     CHIEF COMPLAINT Patient presents for  Chief Complaint  Patient presents with   Central Retinal Vein Occlusion      HISTORY OF PRESENT ILLNESS: Brent Espinoza is a 71 y.o. male who presents to the clinic today for:   HPI   Central retinal vein occlusion with macular edema of right eye  8 week dilate od eylea oct Pt states his vision has been stable Pt denies any new floaters or FOL Last edited by Morene Rankins, CMA on 07/06/2022  8:46 AM.      Referring physician: Carlena Hurl, PA-C [place],  [place] [address]  HISTORICAL INFORMATION:   Selected notes from the MEDICAL RECORD NUMBER    Lab Results  Component Value Date   HGBA1C 6.2 (H) 05/29/2022     CURRENT MEDICATIONS: No current outpatient medications on file. (Ophthalmic Drugs)   No current facility-administered medications for this visit. (Ophthalmic Drugs)   Current Outpatient Medications (Other)  Medication Sig   aspirin EC 81 MG tablet Take 1 tablet (81 mg total) by mouth daily.   atorvastatin (LIPITOR) 40 MG tablet Take 1 tablet (40 mg total) by mouth daily.   metFORMIN (GLUCOPHAGE) 500 MG tablet TAKE 1 TABLET EVERY DAY WITH BREAKFAST   naproxen sodium (ALEVE) 220 MG tablet Take 220 mg by mouth. Takes PRN   No current facility-administered medications for this visit. (Other)      REVIEW OF SYSTEMS: ROS   Negative for: Constitutional, Gastrointestinal, Neurological, Skin, Genitourinary, Musculoskeletal, HENT, Endocrine, Cardiovascular, Eyes, Respiratory, Psychiatric, Allergic/Imm, Heme/Lymph Last edited by Orene Desanctis D, CMA on 07/06/2022  8:46 AM.       ALLERGIES Allergies  Allergen Reactions   Penicillins     As child     PAST MEDICAL HISTORY Past Medical History:  Diagnosis Date   Abnormal CT of the chest 06/08/2018   Allergy    mild   Arthritis    Atherosclerosis of aorta ([place]) 06/08/2018   Cancer ([place]) 2022   skin cancer   Cataract     removed right eye   Coronary artery disease involving native heart without angina pectoris 06/08/2018   Diabetes mellitus without complication ([place])    "pre diabetes"   Erectile dysfunction 06/08/2018   Eye disease 02/18/2017   GERD (gastroesophageal reflux disease)    occ   HDL deficiency 02/19/2017   Hyperlipidemia    Impacted cerumen of right ear 02/18/2017   Leukocytosis 02/19/2017   Mixed dyslipidemia 02/19/2017   Overweight 02/18/2017   Smoking    Tobacco use 02/18/2017   Past Surgical History:  Procedure Laterality Date   COLONOSCOPY     EYE SURGERY     gets therapy injections for vessel disease, hx/o cataract surgery   MOUTH SURGERY      FAMILY HISTORY Family History  Problem Relation Age of Onset   Other Mother        eye disease, died of old age   Other Father        health history unknown   Cancer Neg Hx    Diabetes Neg Hx    Heart disease Neg Hx    Stroke Neg Hx    Hyperlipidemia Neg Hx    Hypertension Neg Hx    Colon cancer Neg Hx    Colon polyps Neg Hx    Rectal cancer Neg Hx    Stomach cancer Neg Hx     SOCIAL HISTORY Social History  Tobacco Use   Smoking status: Every Day    Packs/day: 1.00    Years: 50.00    Total pack years: 50.00    Types: Cigarettes   Smokeless tobacco: Never  Vaping Use   Vaping Use: Never used  Substance Use Topics   Alcohol use: Yes    Comment: occ   Drug use: No         OPHTHALMIC EXAM:  Base Eye Exam     Visual Acuity (ETDRS)       Right Left   Dist cc  20/20 -1   Dist ph cc 20/150     Correction: Glasses         Tonometry (Tonopen, 8:51 AM)       Right Left   Pressure 13 14         Pupils       Pupils APD   Right PERRL +1   Left PERRL None         Visual Fields       Left Right    Full    Restrictions  Partial inner superior temporal, inferior temporal, superior nasal, inferior nasal deficiencies         Extraocular Movement       Right Left    Full, Ortho Full,  Ortho         Neuro/Psych     Oriented x3: Yes   Mood/Affect: Normal         Dilation     Right eye: 2.5% Phenylephrine, 1.0% Mydriacyl @ 8:49 AM           Slit Lamp and Fundus Exam     External Exam       Right Left   External Normal Normal         Slit Lamp Exam       Right Left   Lids/Lashes Normal Normal   Conjunctiva/Sclera White and quiet White and quiet   Cornea Clear Clear   Anterior Chamber Deep and quiet Deep and quiet   Iris Round and reactive Round and reactive   Lens Posterior chamber intraocular lens 2+ Nuclear sclerosis   Anterior Vitreous Normal Normal         Fundus Exam       Right Left   Posterior Vitreous Vitreal papillary traction    Disc Collateralization on the nerve, no NVD    C/D Ratio 0.0    Macula Geographic atrophy, Macular thickening, Cystoid macular edema, Microaneurysms, Epiretinal membrane    Vessels Old central retinal vein occlusion, ischemic at present.    Periphery Normal, good PRP 360, room temporal and nasal should more be needed             IMAGING AND PROCEDURES  Imaging and Procedures for 07/06/22  OCT, Retina - OU - Both Eyes       Right Eye Quality was borderline. Scan locations included subfoveal. Central Foveal Thickness: 413. Progression has been stable. Findings include abnormal foveal contour, epiretinal membrane, central retinal atrophy, outer retinal atrophy.   Left Eye Quality was good. Scan locations included subfoveal. Central Foveal Thickness: 306. Progression has been stable. Findings include normal foveal contour, retinal drusen , vitreomacular adhesion .   Notes OD persistent foveal atrophy from longstanding chronic recurrent CME from [facility] O.  Stable CME OD at 8-week follow-up today and  thus viable macular retina remains as it is  responsive to treatment with Eylea currently at 9-week.  Intravitreal Injection, Pharmacologic Agent - OD - Right Eye       Time  Out 07/06/2022. 9:38 AM. Confirmed correct patient, procedure, site, and patient consented.   Anesthesia Topical anesthesia was used. Anesthetic medications included Lidocaine 4%.   Procedure Preparation included 5% betadine to ocular surface, 10% betadine to eyelids. A 30 gauge needle was used.   Injection: 2 mg aflibercept 2 MG/0.05ML   Route: Intravitreal, Site: Right Eye   NDC: [id number], Lot: [id number], Expiration date: 07/13/2023, Waste: 0 mL   Post-op Post injection exam found visual acuity of at least counting fingers. The patient tolerated the procedure well. There were no complications. The patient received written and verbal post procedure care education. Post injection medications included ocuflox.              ASSESSMENT/PLAN:  Central retinal vein occlusion with macular edema of right eye Overall stable stable acuity now for many years.  At 7 to 9-week interval follow-up.  Repeat injection of Eylea today to maintain.  Reevaluate next in 9 weeks  Advanced nonexudative age-related macular degeneration of right eye with subfoveal involvement Overall stable condition OD.  Reevaluated today and much less CME.  CRVO controlled.  Repeat Eylea today reevaluate next in 9 weeks  Macular pucker, right eye Minor not visually significant  Nuclear sclerotic cataract of left eye Minor with good acuity     ICD-10-CM   1. Central retinal vein occlusion with macular edema of right eye  H34.8110 OCT, Retina - OU - Both Eyes    Intravitreal Injection, Pharmacologic Agent - OD - Right Eye    aflibercept (EYLEA) SOLN 2 mg    2. Advanced nonexudative age-related macular degeneration of right eye with subfoveal involvement  H35.3114     3. Macular pucker, right eye  H35.371     4. Nuclear sclerotic cataract of left eye  H25.12       1.  Repeat Eylea today to maintain.  2.  Reevaluate OD next to 9 weeks  3.  Ophthalmic Meds Ordered this visit:  Meds ordered this  encounter  Medications   aflibercept (EYLEA) SOLN 2 mg       Return in about 9 weeks (around 09/07/2022) for dilate, OD, EYLEA OCT.  Patient Instructions  Patient to contact the office 2 weeks prior to next visit to confirm potentially complete and participation [facility] Medicare   Explained the diagnoses, plan, and follow up with the patient and they expressed understanding.  Patient expressed understanding of the importance of proper follow up care.   Clent Demark Raneen Jaffer M.D. Diseases & Surgery of the Retina and Vitreous Retina & Diabetic [place] 07/06/22     Abbreviations: M myopia (nearsighted); A astigmatism; H hyperopia (farsighted); P presbyopia; Mrx spectacle prescription;  CTL contact lenses; OD right eye; OS left eye; OU both eyes  XT exotropia; ET esotropia; PEK punctate epithelial keratitis; PEE punctate epithelial erosions; DES dry eye syndrome; MGD meibomian gland dysfunction; ATs artificial tears; PFAT's preservative free artificial tears; [place] nuclear sclerotic cataract; PSC posterior subcapsular cataract; ERM epi-retinal membrane; PVD posterior vitreous detachment; RD retinal detachment; DM diabetes mellitus; DR diabetic retinopathy; NPDR non-proliferative diabetic retinopathy; PDR proliferative diabetic retinopathy; CSME clinically significant macular edema; DME diabetic macular edema; dbh dot blot hemorrhages; CWS cotton wool spot; POAG primary open angle glaucoma; C/D cup-to-disc ratio; HVF humphrey visual field; GVF goldmann visual field; OCT optical coherence tomography; IOP intraocular pressure; BRVO Branch retinal vein occlusion; CRVO central retinal vein occlusion;  CRAO central retinal artery occlusion; BRAO branch retinal artery occlusion; RT retinal tear; SB scleral buckle; PPV pars plana vitrectomy; VH Vitreous hemorrhage; PRP panretinal laser photocoagulation; IVK intravitreal kenalog; VMT vitreomacular traction; MH Macular hole;  NVD neovascularization of the disc;  NVE neovascularization elsewhere; AREDS age related eye disease study; ARMD age related macular degeneration; POAG primary open angle glaucoma; EBMD epithelial/anterior basement membrane dystrophy; ACIOL anterior chamber intraocular lens; IOL intraocular lens; PCIOL posterior chamber intraocular lens; Phaco/IOL phacoemulsification with intraocular lens placement; [place] photorefractive keratectomy; LASIK laser assisted in situ keratomileusis; HTN hypertension; DM diabetes mellitus; COPD chronic obstructive pulmonary disease

## 2022-07-06 NOTE — Assessment & Plan Note (Signed)
Minor with good acuity

## 2022-07-06 NOTE — Patient Instructions (Signed)
Patient to contact the office 2 weeks prior to next visit to confirm potentially complete and participation [facility]

## 2022-07-06 NOTE — Assessment & Plan Note (Signed)
Overall stable stable acuity now for many years.  At 7 to 9-week interval follow-up.  Repeat injection of Eylea today to maintain.  Reevaluate next in 9 weeks

## 2022-07-06 NOTE — Assessment & Plan Note (Signed)
Minor not visually significant

## 2022-07-06 NOTE — Assessment & Plan Note (Signed)
Overall stable condition OD.  Reevaluated today and much less CME.  CRVO controlled.  Repeat Eylea today reevaluate next in 9 weeks

## 2022-07-13 ENCOUNTER — Other Ambulatory Visit: Payer: Self-pay | Admitting: Medical

## 2022-07-13 ENCOUNTER — Encounter (INDEPENDENT_AMBULATORY_CARE_PROVIDER_SITE_OTHER): Payer: Medicare HMO | Admitting: Ophthalmology

## 2022-07-20 ENCOUNTER — Encounter (INDEPENDENT_AMBULATORY_CARE_PROVIDER_SITE_OTHER): Payer: Medicare HMO | Admitting: Ophthalmology

## 2022-07-21 ENCOUNTER — Encounter: Payer: Self-pay | Admitting: Internal Medicine

## 2022-08-07 DIAGNOSIS — L570 Actinic keratosis: Secondary | ICD-10-CM | POA: Diagnosis not present

## 2022-08-07 DIAGNOSIS — X32XXXD Exposure to sunlight, subsequent encounter: Secondary | ICD-10-CM | POA: Diagnosis not present

## 2022-08-07 DIAGNOSIS — D225 Melanocytic nevi of trunk: Secondary | ICD-10-CM | POA: Diagnosis not present

## 2022-09-07 ENCOUNTER — Encounter (INDEPENDENT_AMBULATORY_CARE_PROVIDER_SITE_OTHER): Payer: Medicare HMO | Admitting: Ophthalmology

## 2022-09-15 DIAGNOSIS — H43822 Vitreomacular adhesion, left eye: Secondary | ICD-10-CM | POA: Diagnosis not present

## 2022-09-15 DIAGNOSIS — H353114 Nonexudative age-related macular degeneration, right eye, advanced atrophic with subfoveal involvement: Secondary | ICD-10-CM | POA: Diagnosis not present

## 2022-09-15 DIAGNOSIS — H34811 Central retinal vein occlusion, right eye, with macular edema: Secondary | ICD-10-CM | POA: Diagnosis not present

## 2022-09-15 DIAGNOSIS — H35371 Puckering of macula, right eye: Secondary | ICD-10-CM | POA: Diagnosis not present

## 2022-09-15 DIAGNOSIS — H2512 Age-related nuclear cataract, left eye: Secondary | ICD-10-CM | POA: Diagnosis not present

## 2022-10-09 DIAGNOSIS — L570 Actinic keratosis: Secondary | ICD-10-CM | POA: Diagnosis not present

## 2022-10-09 DIAGNOSIS — X32XXXD Exposure to sunlight, subsequent encounter: Secondary | ICD-10-CM | POA: Diagnosis not present

## 2022-10-09 DIAGNOSIS — L820 Inflamed seborrheic keratosis: Secondary | ICD-10-CM | POA: Diagnosis not present

## 2022-11-11 DIAGNOSIS — H353114 Nonexudative age-related macular degeneration, right eye, advanced atrophic with subfoveal involvement: Secondary | ICD-10-CM | POA: Diagnosis not present

## 2022-11-11 DIAGNOSIS — H2512 Age-related nuclear cataract, left eye: Secondary | ICD-10-CM | POA: Diagnosis not present

## 2022-11-11 DIAGNOSIS — H34811 Central retinal vein occlusion, right eye, with macular edema: Secondary | ICD-10-CM | POA: Diagnosis not present

## 2022-11-11 DIAGNOSIS — H43822 Vitreomacular adhesion, left eye: Secondary | ICD-10-CM | POA: Diagnosis not present

## 2022-11-11 DIAGNOSIS — H35371 Puckering of macula, right eye: Secondary | ICD-10-CM | POA: Diagnosis not present

## 2023-01-04 DIAGNOSIS — H353114 Nonexudative age-related macular degeneration, right eye, advanced atrophic with subfoveal involvement: Secondary | ICD-10-CM | POA: Diagnosis not present

## 2023-01-04 DIAGNOSIS — H35371 Puckering of macula, right eye: Secondary | ICD-10-CM | POA: Diagnosis not present

## 2023-01-04 DIAGNOSIS — H353121 Nonexudative age-related macular degeneration, left eye, early dry stage: Secondary | ICD-10-CM | POA: Diagnosis not present

## 2023-01-04 DIAGNOSIS — H43822 Vitreomacular adhesion, left eye: Secondary | ICD-10-CM | POA: Diagnosis not present

## 2023-01-04 DIAGNOSIS — H2512 Age-related nuclear cataract, left eye: Secondary | ICD-10-CM | POA: Diagnosis not present

## 2023-01-04 DIAGNOSIS — H34811 Central retinal vein occlusion, right eye, with macular edema: Secondary | ICD-10-CM | POA: Diagnosis not present

## 2023-02-02 DIAGNOSIS — H349 Unspecified retinal vascular occlusion: Secondary | ICD-10-CM | POA: Diagnosis not present

## 2023-02-02 DIAGNOSIS — H2512 Age-related nuclear cataract, left eye: Secondary | ICD-10-CM | POA: Diagnosis not present

## 2023-02-02 DIAGNOSIS — H5213 Myopia, bilateral: Secondary | ICD-10-CM | POA: Diagnosis not present

## 2023-02-02 DIAGNOSIS — Z961 Presence of intraocular lens: Secondary | ICD-10-CM | POA: Diagnosis not present

## 2023-02-02 DIAGNOSIS — H524 Presbyopia: Secondary | ICD-10-CM | POA: Diagnosis not present

## 2023-02-08 ENCOUNTER — Other Ambulatory Visit: Payer: Self-pay | Admitting: Cardiovascular Disease

## 2023-02-08 DIAGNOSIS — E782 Mixed hyperlipidemia: Secondary | ICD-10-CM

## 2023-02-08 DIAGNOSIS — Z720 Tobacco use: Secondary | ICD-10-CM

## 2023-02-08 NOTE — Telephone Encounter (Signed)
last visit 02/17/2022--FU with me in 12 months next visit 02/23/23

## 2023-02-23 ENCOUNTER — Ambulatory Visit: Payer: Medicare HMO | Attending: Cardiovascular Disease | Admitting: Cardiovascular Disease

## 2023-02-23 ENCOUNTER — Encounter: Payer: Self-pay | Admitting: Cardiovascular Disease

## 2023-02-23 VITALS — BP 142/74 | HR 57 | Ht 68.0 in | Wt 160.0 lb

## 2023-02-23 DIAGNOSIS — I2510 Atherosclerotic heart disease of native coronary artery without angina pectoris: Secondary | ICD-10-CM

## 2023-02-23 DIAGNOSIS — Z720 Tobacco use: Secondary | ICD-10-CM | POA: Diagnosis not present

## 2023-02-23 DIAGNOSIS — I739 Peripheral vascular disease, unspecified: Secondary | ICD-10-CM

## 2023-02-23 DIAGNOSIS — E785 Hyperlipidemia, unspecified: Secondary | ICD-10-CM | POA: Diagnosis not present

## 2023-02-23 NOTE — Patient Instructions (Signed)
Medication Instructions:  No changes *If you need a refill on your cardiac medications before your next appointment, please call your pharmacy*   Lab Work: None ordered If you have labs (blood work) drawn today and your tests are completely normal, you will receive your results only by: MyChart Message (if you have MyChart) OR A paper copy in the mail If you have any lab test that is abnormal or we need to change your treatment, we will call you to review the results.   Testing/Procedures: None ordered   Follow-Up: At [facility], you and your health needs are our priority.  As part of our continuing mission to provide you with exceptional heart care, we have created designated Provider Care Teams.  These Care Teams include your primary Cardiologist (physician) and Advanced Practice Providers (APPs -  Physician Assistants and Nurse Practitioners) who all work together to provide you with the care you need, when you need it.  We recommend signing up for the patient portal called "MyChart".  Sign up information is provided on this After Visit Summary.  MyChart is used to connect with patients for Virtual Visits (Telemedicine).  Patients are able to view lab/test results, encounter notes, upcoming appointments, etc.  Non-urgent messages can be sent to your provider as well.   To learn more about what you can do with MyChart, go to [email or web address].    Your next appointment:   6 month(s)  Provider:   Dr. Kirke Corin Other Instructions Managing the Challenge of Quitting Smoking Quitting smoking is a physical and mental challenge. You may have cravings, withdrawal symptoms, and temptation to smoke. Before quitting, work with your health care provider to make a plan that can help you manage quitting. Making a plan before you quit may keep you from smoking when you have the urge to smoke while trying to quit. How to manage lifestyle changes Managing stress Stress can make you want  to smoke, and wanting to smoke may cause stress. It is important to find ways to manage your stress. You could try some of the following: Practice relaxation techniques. Breathe slowly and deeply, in through your nose and out through your mouth. Listen to music. Soak in a bath or take a shower. Imagine a peaceful place or vacation. Get some support. Talk with family or friends about your stress. Join a support group. Talk with a counselor or therapist. Get some physical activity. Go for a walk, run, or bike ride. Play a favorite sport. Practice yoga.  Medicines Talk with your health care provider about medicines that might help you deal with cravings and make quitting easier for you. Relationships Social situations can be difficult when you are quitting smoking. To manage this, you can: Avoid parties and other social situations where people might be smoking. Avoid alcohol. Leave right away if you have the urge to smoke. Explain to your family and friends that you are quitting smoking. Ask for support and let them know you might be a bit grumpy. Plan activities where smoking is not an option. General instructions Be aware that many people gain weight after they quit smoking. However, not everyone does. To keep from gaining weight, have a plan in place before you quit, and stick to the plan after you quit. Your plan should include: Eating healthy snacks. When you have a craving, it may help to: Eat popcorn, or try carrots, celery, or other cut vegetables. Chew sugar-free gum. Changing how you eat. Eat small portion sizes at  meals. Eat 4-6 small meals throughout the day instead of 1-2 large meals a day. Be mindful when you eat. You should avoid watching television or doing other things that might distract you as you eat. Exercising regularly. Make time to exercise each day. If you do not have time for a long workout, do short bouts of exercise for 5-10 minutes several times a day. Do  some form of strengthening exercise, such as weight lifting. Do some exercise that gets your heart beating and causes you to breathe deeply, such as walking fast, running, swimming, or biking. This is very important. Drinking plenty of water or other low-calorie or no-calorie drinks. Drink enough fluid to keep your urine pale yellow.  How to recognize withdrawal symptoms Your body and mind may experience discomfort as you try to get used to not having nicotine in your system. These effects are called withdrawal symptoms. They may include: Feeling hungrier than normal. Having trouble concentrating. Feeling irritable or restless. Having trouble sleeping. Feeling depressed. Craving a cigarette. These symptoms may surprise you, but they are normal to have when quitting smoking. To manage withdrawal symptoms: Avoid places, people, and activities that trigger your cravings. Remember why you want to quit. Get plenty of sleep. Avoid coffee and other drinks that contain caffeine. These may worsen some of your symptoms. How to manage cravings Come up with a plan for how to deal with your cravings. The plan should include the following: A definition of the specific situation you want to deal with. An activity or action you will take to replace smoking. A clear idea for how this action will help. The name of someone who could help you with this. Cravings usually last for 5-10 minutes. Consider taking the following actions to help you with your plan to deal with cravings: Keep your mouth busy. Chew sugar-free gum. Suck on hard candies or a straw. Brush your teeth. Keep your hands and body busy. Change to a different activity right away. Squeeze or play with a ball. Do an activity or a hobby, such as making bead jewelry, practicing needlepoint, or working with wood. Mix up your normal routine. Take a short exercise break. Go for a quick walk, or run up and down stairs. Focus on doing something  kind or helpful for someone else. Call a friend or family member to talk during a craving. Join a support group. Contact a quitline. Where to find support To get help or find a support group: Call the National Cancer Institute's Smoking Quitline: 1-800-QUIT-NOW 403-171-1105) Text QUIT to SmokefreeTXT: 454098 Where to find more information Visit these websites to find more information on quitting smoking: U.S. Department of Health and Human Services: www.smokefree.gov American Lung Association: www.freedomfromsmoking.org Centers for Disease Control and Prevention (CDC): FootballExhibition.com.br American Heart Association: www.heart.org Contact a health care provider if: You want to change your plan for quitting. The medicines you are taking are not helping. Your eating feels out of control or you cannot sleep. You feel depressed or become very anxious. Summary Quitting smoking is a physical and mental challenge. You will face cravings, withdrawal symptoms, and temptation to smoke again. Preparation can help you as you go through these challenges. Try different techniques to manage stress, handle social situations, and prevent weight gain. You can deal with cravings by keeping your mouth busy (such as by chewing gum), keeping your hands and body busy, calling family or friends, or contacting a quitline for people who want to quit smoking. You can deal  with withdrawal symptoms by avoiding places where people smoke, getting plenty of rest, and avoiding drinks that contain caffeine. This information is not intended to replace advice given to you by your health care provider. Make sure you discuss any questions you have with your health care provider. Document Revised: 09/19/2021 Document Reviewed: 09/19/2021 Elsevier Patient Education  2023 [employer].

## 2023-02-23 NOTE — Progress Notes (Signed)
Cardiology Office Note   Date:  02/23/2023   ID:  Brent, Espinoza [birth year], MRN [id number]  PCP:  Jac Canavan, PA-C  Cardiologist:   Lorine Bears, MD   No chief complaint on file.     History of Present Illness: Brent Espinoza is a 72 y.o. male who is here today for af follow up visit regarding PAD.  The patient has prolonged history of smoking and hyperlipidemia.  He smokes 1 pack/day and has been doing so since 72 years old.  He has no family history of coronary artery disease.  He also has known history of coronary artery calcifications noted on prior CT scan He has known history of peripheral arterial disease with mild bilateral calf claudication and erectile dysfunction.   He had noninvasive vascular studies done in 2019 which showed mildly reduced ABI bilaterally in the 0.7 range.  Aortoiliac duplex showed severe bilateral common iliac artery stenosis in addition to an occluded left SFA. The patient did not want to have procedures done and this was treated medically.    He does report slight worsening of bilateral claudication. in addition, he reports worsening erectile dysfunction.  He continues to smoke 1 pack/day.  He denies chest pain or worsening dyspnea.  Past Medical History:  Diagnosis Date   Abnormal CT of the chest 06/08/2018   Allergy    mild   Arthritis    Atherosclerosis of aorta ([facility]) 06/08/2018   Cancer ([facility]) 2022   skin cancer   Cataract    removed right eye   Coronary artery disease involving native heart without angina pectoris 06/08/2018   Diabetes mellitus without complication ([facility])    "pre diabetes"   Erectile dysfunction 06/08/2018   Eye disease 02/18/2017   GERD (gastroesophageal reflux disease)    occ   HDL deficiency 02/19/2017   Hyperlipidemia    Impacted cerumen of right ear 02/18/2017   Leukocytosis 02/19/2017   Mixed dyslipidemia 02/19/2017   Overweight 02/18/2017   Smoking    Tobacco use 02/18/2017    Past  Surgical History:  Procedure Laterality Date   COLONOSCOPY     EYE SURGERY     gets therapy injections for vessel disease, hx/o cataract surgery   MOUTH SURGERY       Current Outpatient Medications  Medication Sig Dispense Refill   aspirin EC 81 MG tablet Take 1 tablet (81 mg total) by mouth daily. 90 tablet 3   atorvastatin (LIPITOR) 40 MG tablet TAKE 1 TABLET EVERY DAY 90 tablet 0   metFORMIN (GLUCOPHAGE) 500 MG tablet TAKE 1 TABLET EVERY DAY WITH BREAKFAST 90 tablet 1   naproxen sodium (ALEVE) 220 MG tablet Take 220 mg by mouth. Takes PRN (Patient not taking: Reported on 02/23/2023)     No current facility-administered medications for this visit.    Allergies:   Penicillins    Social History:  The patient  reports that he has been smoking cigarettes. He has a 50.00 pack-year smoking history. He has never used smokeless tobacco. He reports current alcohol use. He reports that he does not use drugs.   Family History:  The patient's family history is negative for coronary artery disease, peripheral arterial disease or cancer.   ROS:  Please see the history of present illness.   Otherwise, review of systems are positive for none.   All other systems are reviewed and negative.    PHYSICAL EXAM: VS:  BP (!) 142/74 (BP Location: Left Arm, Patient  Position: Sitting, Cuff Size: Normal)   Pulse (!) 57   Ht 5\' 8"  (1.727 m)   Wt 160 lb (72.6 kg)   SpO2 98%   BMI 24.33 kg/m  , BMI Body mass index is 24.33 kg/m. GEN: Well nourished, well developed, in no acute distress  HEENT: normal  Neck: no JVD, carotid bruits, or masses Cardiac: RRR; no murmurs, rubs, or gallops,no edema  Respiratory:  clear to auscultation bilaterally, normal work of breathing GI: soft, nontender, nondistended, + BS MS: no deformity or atrophy  Skin: warm and dry, no rash Neuro:  Strength and sensation are intact Psych: euthymic mood, full affect Vascular: Femoral pulses barely palpable bilaterally.  Distal  pulses are not palpable.   EKG:  EKG is ordered today. EKG showed sinus bradycardia with no significant ST or T wave changes.   Recent Labs: 05/29/2022: ALT 16; BUN 13; Creatinine, Ser 0.94; Hemoglobin 16.3; Platelets 299; Potassium 4.3; Sodium 140    Lipid Panel    Component Value Date/Time   CHOL 97 (L) 05/29/2022 1032   TRIG 60 05/29/2022 1032   HDL 34 (L) 05/29/2022 1032   CHOLHDL 2.9 05/29/2022 1032   CHOLHDL 5.4 (H) 02/18/2017 0939   VLDL 12 02/18/2017 0939   LDLCALC 49 05/29/2022 1032      Wt Readings from Last 3 Encounters:  02/23/23 160 lb (72.6 kg)  05/29/22 161 lb 3.2 oz (73.1 kg)  02/17/22 162 lb 12.8 oz (73.8 kg)           No data to display            ASSESSMENT AND PLAN:  1.  Peripheral arterial disease: He reports worsening claudication and erectile dysfunction.  He is known to have severe bilateral common iliac artery disease.  I discussed with him the option of proceeding with angiography and possible endovascular intervention but he wants to continue to monitor his symptoms.    2.  Tobacco use: I again discussed with him the importance of smoking cessation.  3.  Hyperlipidemia: Continue treatment with atorvastatin.  I reviewed most recent lipid profile done in August which showed an LDL of 49 which is at target.  4.  Coronary atherosclerosis noted on previous CT scan: Currently with no convincing symptoms of angina.  5.  Erectile dysfunction: Likely due to significant bilateral common iliac artery disease.    Disposition:   FU with me in 6 months  Signed,  Lorine Bears, MD  02/23/2023 10:12 AM    [facility]

## 2023-03-01 DIAGNOSIS — H34811 Central retinal vein occlusion, right eye, with macular edema: Secondary | ICD-10-CM | POA: Diagnosis not present

## 2023-03-01 DIAGNOSIS — H43822 Vitreomacular adhesion, left eye: Secondary | ICD-10-CM | POA: Diagnosis not present

## 2023-03-01 DIAGNOSIS — H35371 Puckering of macula, right eye: Secondary | ICD-10-CM | POA: Diagnosis not present

## 2023-03-01 DIAGNOSIS — H353121 Nonexudative age-related macular degeneration, left eye, early dry stage: Secondary | ICD-10-CM | POA: Diagnosis not present

## 2023-03-01 DIAGNOSIS — H2512 Age-related nuclear cataract, left eye: Secondary | ICD-10-CM | POA: Diagnosis not present

## 2023-03-01 DIAGNOSIS — H353114 Nonexudative age-related macular degeneration, right eye, advanced atrophic with subfoveal involvement: Secondary | ICD-10-CM | POA: Diagnosis not present

## 2023-03-01 LAB — HM DIABETES EYE EXAM

## 2023-03-09 ENCOUNTER — Encounter: Payer: Self-pay | Admitting: Internal Medicine

## 2023-03-10 DIAGNOSIS — Z85828 Personal history of other malignant neoplasm of skin: Secondary | ICD-10-CM | POA: Diagnosis not present

## 2023-03-10 DIAGNOSIS — Z08 Encounter for follow-up examination after completed treatment for malignant neoplasm: Secondary | ICD-10-CM | POA: Diagnosis not present

## 2023-03-13 ENCOUNTER — Other Ambulatory Visit: Payer: Self-pay | Admitting: Cardiovascular Disease

## 2023-03-13 DIAGNOSIS — E782 Mixed hyperlipidemia: Secondary | ICD-10-CM

## 2023-03-13 DIAGNOSIS — Z720 Tobacco use: Secondary | ICD-10-CM

## 2023-03-15 ENCOUNTER — Other Ambulatory Visit: Payer: Self-pay | Admitting: Medical

## 2023-05-10 DIAGNOSIS — H35371 Puckering of macula, right eye: Secondary | ICD-10-CM | POA: Diagnosis not present

## 2023-05-10 DIAGNOSIS — H43822 Vitreomacular adhesion, left eye: Secondary | ICD-10-CM | POA: Diagnosis not present

## 2023-05-10 DIAGNOSIS — H353121 Nonexudative age-related macular degeneration, left eye, early dry stage: Secondary | ICD-10-CM | POA: Diagnosis not present

## 2023-05-10 DIAGNOSIS — H2512 Age-related nuclear cataract, left eye: Secondary | ICD-10-CM | POA: Diagnosis not present

## 2023-05-10 DIAGNOSIS — H34811 Central retinal vein occlusion, right eye, with macular edema: Secondary | ICD-10-CM | POA: Diagnosis not present

## 2023-05-10 DIAGNOSIS — H353114 Nonexudative age-related macular degeneration, right eye, advanced atrophic with subfoveal involvement: Secondary | ICD-10-CM | POA: Diagnosis not present

## 2023-05-10 LAB — HM DIABETES EYE EXAM

## 2023-05-11 ENCOUNTER — Encounter: Payer: Self-pay | Admitting: Internal Medicine

## 2023-05-18 ENCOUNTER — Ambulatory Visit (INDEPENDENT_AMBULATORY_CARE_PROVIDER_SITE_OTHER): Payer: Medicare HMO

## 2023-05-18 DIAGNOSIS — Z0000 Encounter for general adult medical examination without abnormal findings: Secondary | ICD-10-CM

## 2023-05-18 NOTE — Progress Notes (Signed)
Subjective:   Brent Espinoza is a 72 y.o. male who presents for Medicare Annual/Subsequent preventive examination.  Visit Complete: Virtual  I connected with  Brent Espinoza on 05/18/23 by a audio enabled telemedicine application and verified that I am speaking with the correct person using two identifiers.  Patient Location: Home  Provider Location: Office/Clinic  I discussed the limitations of evaluation and management by telemedicine. The patient expressed understanding and agreed to proceed.  Patient Medicare AWV questionnaire was completed by the patient on 05/14/2023; I have confirmed that all information answered by patient is correct and no changes since this date.   Vital Signs: Patient was unable to self-report vital signs via telehealth due to a lack of equipment at home.  Review of Systems     Cardiac Risk Factors include: advanced age (>32men, >57 women);smoking/ tobacco exposure     Objective:    Today's Vitals   There is no height or weight on file to calculate BMI.     05/18/2023   10:50 AM 07/19/2019   10:12 AM 03/18/2018   10:51 AM 02/18/2017   10:00 AM  Advanced Directives  Does Patient Have a Medical Advance Directive? No No No No  Would patient like information on creating a medical advance directive?  No - Patient declined      Current Medications (verified) Outpatient Encounter Medications as of 05/18/2023  Medication Sig   aspirin EC 81 MG tablet Take 1 tablet (81 mg total) by mouth daily.   atorvastatin (LIPITOR) 40 MG tablet TAKE 1 TABLET EVERY DAY   metFORMIN (GLUCOPHAGE) 500 MG tablet TAKE 1 TABLET EVERY DAY WITH BREAKFAST   naproxen sodium (ALEVE) 220 MG tablet Take 220 mg by mouth. Takes PRN   No facility-administered encounter medications on file as of 05/18/2023.    Allergies (verified) Penicillins   History: Past Medical History:  Diagnosis Date   Abnormal CT of the chest 06/08/2018   Allergy    mild   Arthritis     Atherosclerosis of aorta (HCC) 06/08/2018   Cancer (HCC) 2022   skin cancer   Cataract    removed right eye   Coronary artery disease involving native heart without angina pectoris 06/08/2018   Diabetes mellitus without complication (HCC)    "pre diabetes"   Erectile dysfunction 06/08/2018   Eye disease 02/18/2017   GERD (gastroesophageal reflux disease)    occ   HDL deficiency 02/19/2017   Hyperlipidemia    Impacted cerumen of right ear 02/18/2017   Leukocytosis 02/19/2017   Mixed dyslipidemia 02/19/2017   Overweight 02/18/2017   Smoking    Tobacco use 02/18/2017   Past Surgical History:  Procedure Laterality Date   COLONOSCOPY     EYE SURGERY     gets therapy injections for vessel disease, hx/o cataract surgery   MOUTH SURGERY     Family History  Problem Relation Age of Onset   Other Mother        eye disease, died of old age   Other Father        health history unknown   Cancer Neg Hx    Diabetes Neg Hx    Heart disease Neg Hx    Stroke Neg Hx    Hyperlipidemia Neg Hx    Hypertension Neg Hx    Colon cancer Neg Hx    Colon polyps Neg Hx    Rectal cancer Neg Hx    Stomach cancer Neg Hx    Social  History   Socioeconomic History   Marital status: Divorced    Spouse name: Not on file   Number of children: Not on file   Years of education: Not on file   Highest education level: Not on file  Occupational History   Not on file  Tobacco Use   Smoking status: Every Day    Current packs/day: 1.00    Average packs/day: 1 pack/day for 50.0 years (50.0 ttl pk-yrs)    Types: Cigarettes   Smokeless tobacco: Never   Tobacco comments:    02/23/2023 patient smokes a little less then a pack daily  Vaping Use   Vaping status: Never Used  Substance and Sexual Activity   Alcohol use: Yes    Comment: occ   Drug use: No   Sexual activity: Not on file  Other Topics Concern   Not on file  Social History Narrative   Alone.   Retired as of 2018.   Was at [employer].    Exercise - yard work, Programmer, applications around, walks.  Eats relatively healthy.   Has 2 children, 1 in [place], 1 in [place], 4 grandchildren.  Talks with his child in [place] every week.  05/2022   Social Determinants of Health   Financial Resource Strain: Low Risk  (05/14/2023)   Overall Financial Resource Strain (CARDIA)    Difficulty of Paying Living Expenses: Not hard at all  Food Insecurity: No Food Insecurity (05/14/2023)   Hunger Vital Sign    Worried About Running Out of Food in the Last Year: Never true    Ran Out of Food in the Last Year: Never true  Transportation Needs: Unknown (05/14/2023)   PRAPARE - Administrator, Civil Service (Medical): No    Lack of Transportation (Non-Medical): Not on file  Physical Activity: Insufficiently Active (05/14/2023)   Exercise Vital Sign    Days of Exercise per Week: 3 days    Minutes of Exercise per Session: 20 min  Stress: No Stress Concern Present (05/14/2023)   [employer] of Occupational Health - Occupational Stress Questionnaire    Feeling of Stress : Not at all  Social Connections: Unknown (05/14/2023)   Social Connection and Isolation Panel [NHANES]    Frequency of Communication with Friends and Family: Twice a week    Frequency of Social Gatherings with Friends and Family: Not on file    Attends Religious Services: Not on file    Active Member of Clubs or Organizations: No    Attends Banker Meetings: Never    Marital Status: Divorced    Tobacco Counseling Ready to quit: Not Answered Counseling given: Not Answered Tobacco comments: 02/23/2023 patient smokes a little less then a pack daily   Clinical Intake:  Pre-visit preparation completed: Yes  Pain : No/denies pain     Nutritional Risks: None Diabetes: No  How often do you need to have someone help you when you read instructions, pamphlets, or other written materials from your doctor or pharmacy?: 1 - Never  Interpreter Needed?:  No  Information entered by :: NAllen LPN   Activities of Daily Living    05/14/2023    9:44 AM 05/29/2022    9:45 AM  In your present state of health, do you have any difficulty performing the following activities:  Hearing? 0 0  Vision? 0 0  Difficulty concentrating or making decisions? 0 0  Walking or climbing stairs? 0 0  Dressing or bathing? 0 0  Doing errands, shopping?  0 0  Preparing Food and eating ?  N  Using the Toilet? N N  In the past six months, have you accidently leaked urine? N N  Do you have problems with loss of bowel control? N N  Managing your Medications? N N  Managing your Finances? N N  Housekeeping or managing your Housekeeping? N N    Patient Care Team: Tysinger, Kermit Balo, PA-C as PCP - General (Family Medicine) Wendall Stade, MD as PCP - Cardiology (Cardiology) Elise Benne, MD as Consulting Physician (Ophthalmology) Luciana Axe Alford Highland, MD as Consulting Physician (Ophthalmology)  Indicate any recent Medical Services you may have received from other than [facility] providers in the past year (date may be approximate).     Assessment:   This is a routine wellness examination for [place].  Hearing/Vision screen Hearing Screening - Comments:: Denies hearing issues Vision Screening - Comments:: Regular eye exams, Dr. Elise Benne  Dietary issues and exercise activities discussed:     Goals Addressed             This Visit's Progress    Patient Stated       05/18/2023, stay healthy       Depression Screen    05/18/2023   10:52 AM 05/29/2022    9:45 AM 05/28/2021   10:22 AM 07/19/2019   10:13 AM 02/22/2018    1:01 PM 02/18/2017    9:53 AM  PHQ 2/9 Scores  PHQ - 2 Score 0 1 0 0 0 0  PHQ- 9 Score 0         Fall Risk    05/14/2023    9:44 AM 05/29/2022    9:45 AM 05/28/2021   10:22 AM 07/19/2019   10:13 AM 02/22/2018    1:01 PM  Fall Risk   Falls in the past year? 0 0 0 0 No  Number falls in past yr: 0 0 0    Injury with Fall? 0 0 0    Risk for  fall due to : Medication side effect No Fall Risks No Fall Risks    Follow up Falls prevention discussed;Falls evaluation completed Falls evaluation completed Falls evaluation completed      MEDICARE RISK AT HOME:  Medicare Risk at Home - 05/18/23 1052     Any stairs in or around the home? Yes    If so, are there any without handrails? No    Home free of loose throw rugs in walkways, pet beds, electrical cords, etc? Yes    Adequate lighting in your home to reduce risk of falls? Yes    Life alert? No    Use of a cane, walker or w/c? Yes    Grab bars in the bathroom? Yes    Shower chair or bench in shower? No    Elevated toilet seat or a handicapped toilet? No             TIMED UP AND GO:  Was the test performed?  No    Cognitive Function:        05/18/2023   10:53 AM  6CIT Screen  What Year? 0 points  What month? 0 points  What time? 0 points  Count back from 20 0 points  Months in reverse 0 points  Repeat phrase 0 points  Total Score 0 points    Immunizations Immunization History  Administered Date(s) Administered   Fluad Quad(high Dose 65+) 06/13/2019   Influenza Split 10/10/2013   Influenza, High Dose  Seasonal PF 07/13/2017, 08/01/2018, 08/01/2018   Influenza,inj,Quad PF,6+ Mos 08/21/2014   Influenza-Unspecified 06/24/2016, 06/30/2021   PFIZER Comirnaty(Gray Top)Covid-19 Tri-Sucrose Vaccine 01/18/2020, 02/12/2020, 09/19/2020   Pneumococcal Conjugate-13 06/24/2016    TDAP status: Due, Education has been provided regarding the importance of this vaccine. Advised may receive this vaccine at local pharmacy or Health Dept. Aware to provide a copy of the vaccination record if obtained from local pharmacy or Health Dept. Verbalized acceptance and understanding.  Flu Vaccine status: Due, Education has been provided regarding the importance of this vaccine. Advised may receive this vaccine at local pharmacy or Health Dept. Aware to provide a copy of the vaccination  record if obtained from local pharmacy or Health Dept. Verbalized acceptance and understanding.  Pneumococcal vaccine status: Up to date  Covid-19 vaccine status: Information provided on how to obtain vaccines.   Qualifies for Shingles Vaccine? Yes   Zostavax completed No   Shingrix Completed?: No.    Education has been provided regarding the importance of this vaccine. Patient has been advised to call insurance company to determine out of pocket expense if they have not yet received this vaccine. Advised may also receive vaccine at local pharmacy or Health Dept. Verbalized acceptance and understanding.  Screening Tests Health Maintenance  Topic Date Due   Hepatitis C Screening  Never done   DTaP/Tdap/Td (1 - Tdap) Never done   Zoster Vaccines- Shingrix (1 of 2) Never done   Pneumonia Vaccine 13+ Years old (2 of 2 - PPSV23 or PCV20) 08/19/2016   COVID-19 Vaccine (4 - 2023-24 season) 06/12/2022   INFLUENZA VACCINE  05/13/2023   Lung Cancer Screening  06/27/2023   Medicare Annual Wellness (AWV)  05/17/2024   Colonoscopy  07/30/2026   HPV VACCINES  Aged Out    Health Maintenance  Health Maintenance Due  Topic Date Due   Hepatitis C Screening  Never done   DTaP/Tdap/Td (1 - Tdap) Never done   Zoster Vaccines- Shingrix (1 of 2) Never done   Pneumonia Vaccine 26+ Years old (2 of 2 - PPSV23 or PCV20) 08/19/2016   COVID-19 Vaccine (4 - 2023-24 season) 06/12/2022   INFLUENZA VACCINE  05/13/2023    Colorectal cancer screening: Type of screening: Colonoscopy. Completed 07/30/2021. Repeat every 5 years  Lung Cancer Screening: (Low Dose CT Chest recommended if Age 49-80 years, 20 pack-year currently smoking OR have quit w/in 15years.) does qualify.   Lung Cancer Screening Referral: CT scan 06/26/2022  Additional Screening:  Hepatitis C Screening: does qualify;   Vision Screening: Recommended annual ophthalmology exams for early detection of glaucoma and other disorders of the  eye. Is the patient up to date with their annual eye exam?  Yes  Who is the provider or what is the name of the office in which the patient attends annual eye exams? Dr. Emily Filbert If pt is not established with a provider, would they like to be referred to a provider to establish care? No .   Dental Screening: Recommended annual dental exams for proper oral hygiene  Diabetic Foot Exam: n/a  Community Resource Referral / Chronic Care Management: CRR required this visit?  No   CCM required this visit?  No     Plan:     I have personally reviewed and noted the following in the patient's chart:   Medical and social history Use of alcohol, tobacco or illicit drugs  Current medications and supplements including opioid prescriptions. Patient is not currently taking opioid prescriptions. Functional ability and  status Nutritional status Physical activity Advanced directives List of other physicians Hospitalizations, surgeries, and ER visits in previous 12 months Vitals Screenings to include cognitive, depression, and falls Referrals and appointments  In addition, I have reviewed and discussed with patient certain preventive protocols, quality metrics, and best practice recommendations. A written personalized care plan for preventive services as well as general preventive health recommendations were provided to patient.     Barb Merino, LPN   4/0/9811   After Visit Summary: (MyChart) Due to this being a telephonic visit, the after visit summary with patients personalized plan was offered to patient via MyChart   Nurse Notes: none

## 2023-05-18 NOTE — Patient Instructions (Signed)
Brent Espinoza , Thank you for taking time to come for your Medicare Wellness Visit. I appreciate your ongoing commitment to your health goals. Please review the following plan we discussed and let me know if I can assist you in the future.   Referrals/Orders/Follow-Ups/Clinician Recommendations: smoking  This is a list of the screening recommended for you and due dates:  Health Maintenance  Topic Date Due   Hepatitis C Screening  Never done   DTaP/Tdap/Td vaccine (1 - Tdap) Never done   Zoster (Shingles) Vaccine (1 of 2) Never done   Pneumonia Vaccine (2 of 2 - PPSV23 or PCV20) 08/19/2016   COVID-19 Vaccine (4 - 2023-24 season) 06/12/2022   Flu Shot  05/13/2023   Screening for Lung Cancer  06/27/2023   Medicare Annual Wellness Visit  05/17/2024   Colon Cancer Screening  07/30/2026   HPV Vaccine  Aged Out    Advanced directives: (ACP Link)Information on Advanced Care Planning can be found at [facility] of [place] Advance Health Care Directives Advance Health Care Directives ([email or web address]   Next Medicare Annual Wellness Visit scheduled for next year: Yes  Preventive Care 65 Years and Older, Male  Preventive care refers to lifestyle choices and visits with your health care provider that can promote health and wellness. What does preventive care include? A yearly physical exam. This is also called an annual well check. Dental exams once or twice a year. Routine eye exams. Ask your health care provider how often you should have your eyes checked. Personal lifestyle choices, including: Daily care of your teeth and gums. Regular physical activity. Eating a healthy diet. Avoiding tobacco and drug use. Limiting alcohol use. Practicing safe sex. Taking low doses of aspirin every day. Taking vitamin and mineral supplements as recommended by your health care provider. What happens during an annual well check? The services and screenings done by your health care provider during  your annual well check will depend on your age, overall health, lifestyle risk factors, and family history of disease. Counseling  Your health care provider may ask you questions about your: Alcohol use. Tobacco use. Drug use. Emotional well-being. Home and relationship well-being. Sexual activity. Eating habits. History of falls. Memory and ability to understand (cognition). Work and work Astronomer. Screening  You may have the following tests or measurements: Height, weight, and BMI. Blood pressure. Lipid and cholesterol levels. These may be checked every 5 years, or more frequently if you are over 29 years old. Skin check. Lung cancer screening. You may have this screening every year starting at age 63 if you have a 30-pack-year history of smoking and currently smoke or have quit within the past 15 years. Fecal occult blood test (FOBT) of the stool. You may have this test every year starting at age 72. Flexible sigmoidoscopy or colonoscopy. You may have a sigmoidoscopy every 5 years or a colonoscopy every 10 years starting at age 76. Prostate cancer screening. Recommendations will vary depending on your family history and other risks. Hepatitis C blood test. Hepatitis B blood test. Sexually transmitted disease (STD) testing. Diabetes screening. This is done by checking your blood sugar (glucose) after you have not eaten for a while (fasting). You may have this done every 1-3 years. Abdominal aortic aneurysm (AAA) screening. You may need this if you are a current or former smoker. Osteoporosis. You may be screened starting at age 64 if you are at high risk. Talk with your health care provider about your test results, treatment  options, and if necessary, the need for more tests. Vaccines  Your health care provider may recommend certain vaccines, such as: Influenza vaccine. This is recommended every year. Tetanus, diphtheria, and acellular pertussis (Tdap, Td) vaccine. You may need  a Td booster every 10 years. Zoster vaccine. You may need this after age 48. Pneumococcal 13-valent conjugate (PCV13) vaccine. One dose is recommended after age 64. Pneumococcal polysaccharide (PPSV23) vaccine. One dose is recommended after age 66. Talk to your health care provider about which screenings and vaccines you need and how often you need them. This information is not intended to replace advice given to you by your health care provider. Make sure you discuss any questions you have with your health care provider. Document Released: 10/25/2015 Document Revised: 06/17/2016 Document Reviewed: 07/30/2015 Elsevier Interactive Patient Education  2017 [employer].  Fall Prevention in the Home Falls can cause injuries. They can happen to people of all ages. There are many things you can do to make your home safe and to help prevent falls. What can I do on the outside of my home? Regularly fix the edges of walkways and driveways and fix any cracks. Remove anything that might make you trip as you walk through a door, such as a raised step or threshold. Trim any bushes or trees on the path to your home. Use bright outdoor lighting. Clear any walking paths of anything that might make someone trip, such as rocks or tools. Regularly check to see if handrails are loose or broken. Make sure that both sides of any steps have handrails. Any raised decks and porches should have guardrails on the edges. Have any leaves, snow, or ice cleared regularly. Use sand or salt on walking paths during winter. Clean up any spills in your garage right away. This includes oil or grease spills. What can I do in the bathroom? Use night lights. Install grab bars by the toilet and in the tub and shower. Do not use towel bars as grab bars. Use non-skid mats or decals in the tub or shower. If you need to sit down in the shower, use a plastic, non-slip stool. Keep the floor dry. Clean up any water that spills on the  floor as soon as it happens. Remove soap buildup in the tub or shower regularly. Attach bath mats securely with double-sided non-slip rug tape. Do not have throw rugs and other things on the floor that can make you trip. What can I do in the bedroom? Use night lights. Make sure that you have a light by your bed that is easy to reach. Do not use any sheets or blankets that are too big for your bed. They should not hang down onto the floor. Have a firm chair that has side arms. You can use this for support while you get dressed. Do not have throw rugs and other things on the floor that can make you trip. What can I do in the kitchen? Clean up any spills right away. Avoid walking on wet floors. Keep items that you use a lot in easy-to-reach places. If you need to reach something above you, use a strong step stool that has a grab bar. Keep electrical cords out of the way. Do not use floor polish or wax that makes floors slippery. If you must use wax, use non-skid floor wax. Do not have throw rugs and other things on the floor that can make you trip. What can I do with my stairs? Do not  leave any items on the stairs. Make sure that there are handrails on both sides of the stairs and use them. Fix handrails that are broken or loose. Make sure that handrails are as long as the stairways. Check any carpeting to make sure that it is firmly attached to the stairs. Fix any carpet that is loose or worn. Avoid having throw rugs at the top or bottom of the stairs. If you do have throw rugs, attach them to the floor with carpet tape. Make sure that you have a light switch at the top of the stairs and the bottom of the stairs. If you do not have them, ask someone to add them for you. What else can I do to help prevent falls? Wear shoes that: Do not have high heels. Have rubber bottoms. Are comfortable and fit you well. Are closed at the toe. Do not wear sandals. If you use a stepladder: Make sure that  it is fully opened. Do not climb a closed stepladder. Make sure that both sides of the stepladder are locked into place. Ask someone to hold it for you, if possible. Clearly mark and make sure that you can see: Any grab bars or handrails. First and last steps. Where the edge of each step is. Use tools that help you move around (mobility aids) if they are needed. These include: Canes. Walkers. Scooters. Crutches. Turn on the lights when you go into a dark area. Replace any light bulbs as soon as they burn out. Set up your furniture so you have a clear path. Avoid moving your furniture around. If any of your floors are uneven, fix them. If there are any pets around you, be aware of where they are. Review your medicines with your doctor. Some medicines can make you feel dizzy. This can increase your chance of falling. Ask your doctor what other things that you can do to help prevent falls. This information is not intended to replace advice given to you by your health care provider. Make sure you discuss any questions you have with your health care provider. Document Released: 07/25/2009 Document Revised: 03/05/2016 Document Reviewed: 11/02/2014 Elsevier Interactive Patient Education  2017 [employer].

## 2023-05-27 ENCOUNTER — Other Ambulatory Visit: Payer: Self-pay | Admitting: Medical

## 2023-05-27 NOTE — Telephone Encounter (Signed)
Appt in September.

## 2023-06-22 ENCOUNTER — Ambulatory Visit (INDEPENDENT_AMBULATORY_CARE_PROVIDER_SITE_OTHER): Payer: Medicare HMO | Admitting: Medical

## 2023-06-22 ENCOUNTER — Encounter: Payer: Self-pay | Admitting: Medical

## 2023-06-22 VITALS — BP 138/80 | HR 60 | Ht 67.0 in | Wt 161.2 lb

## 2023-06-22 DIAGNOSIS — N529 Male erectile dysfunction, unspecified: Secondary | ICD-10-CM | POA: Diagnosis not present

## 2023-06-22 DIAGNOSIS — E782 Mixed hyperlipidemia: Secondary | ICD-10-CM

## 2023-06-22 DIAGNOSIS — I2510 Atherosclerotic heart disease of native coronary artery without angina pectoris: Secondary | ICD-10-CM

## 2023-06-22 DIAGNOSIS — Z7189 Other specified counseling: Secondary | ICD-10-CM

## 2023-06-22 DIAGNOSIS — Z7185 Encounter for immunization safety counseling: Secondary | ICD-10-CM

## 2023-06-22 DIAGNOSIS — Z0000 Encounter for general adult medical examination without abnormal findings: Secondary | ICD-10-CM | POA: Diagnosis not present

## 2023-06-22 DIAGNOSIS — I700 Atherosclerosis of aorta: Secondary | ICD-10-CM

## 2023-06-22 DIAGNOSIS — Z720 Tobacco use: Secondary | ICD-10-CM | POA: Diagnosis not present

## 2023-06-22 DIAGNOSIS — F419 Anxiety disorder, unspecified: Secondary | ICD-10-CM

## 2023-06-22 DIAGNOSIS — R7301 Impaired fasting glucose: Secondary | ICD-10-CM

## 2023-06-22 DIAGNOSIS — I739 Peripheral vascular disease, unspecified: Secondary | ICD-10-CM | POA: Diagnosis not present

## 2023-06-22 DIAGNOSIS — Z122 Encounter for screening for malignant neoplasm of respiratory organs: Secondary | ICD-10-CM

## 2023-06-22 NOTE — Patient Instructions (Signed)
This visit was a preventative care visit, also known as wellness visit or routine physical.   Topics typically include healthy lifestyle, diet, exercise, preventative care, vaccinations, sick and well care, proper use of emergency dept and after hours care, as well as other concerns.     Recommendations: Continue to return yearly for your annual wellness and preventative care visits.  This gives [place] a chance to discuss healthy lifestyle, exercise, vaccinations, review your chart record, and perform screenings where appropriate.  I recommend you see your eye doctor yearly for routine vision care.  I recommend you see your dentist yearly for routine dental care including hygiene visits twice yearly.   Vaccination recommendations were reviewed Immunization History  Administered Date(s) Administered   Fluad Quad(high Dose 65+) 06/13/2019   Influenza Split 10/10/2013   Influenza, High Dose Seasonal PF 07/13/2017, 08/01/2018, 08/01/2018   Influenza,inj,Quad PF,6+ Mos 08/21/2014   Influenza-Unspecified 06/24/2016, 06/30/2021   PFIZER Comirnaty(Gray Top)Covid-19 Tri-Sucrose Vaccine 01/18/2020, 02/12/2020, 09/19/2020   Pneumococcal Conjugate-13 06/24/2016   He plans to get flu and covid vaccine at [facility] soon  Declines shingrix, tdap and prenvar 20 vaccines   Screening for cancer: Colon cancer screening: I reviewed your colonoscopy on file that is up to date from 07/2021.  Skin cancer screening: Check your skin regularly for new changes, growing lesions, or other lesions of concern Come in for evaluation if you have skin lesions of concern. Continue routine follow-up with dermatology  Lung cancer screening: If you have a greater than 20 pack year history of tobacco use, then you may qualify for lung cancer screening with a chest CT scan.   Please call your insurance company to inquire about coverage for this test.  CT chest lung cancer screen updated order today  We currently  don't have screenings for other cancers besides breast, cervical, colon, and lung cancers.  If you have a strong family history of cancer or have other cancer screening concerns, please let me know.    Bone health: Get at least 150 minutes of aerobic exercise weekly Get weight bearing exercise at least once weekly Bone density test:  A bone density test is an imaging test that uses a type of X-ray to measure the amount of calcium and other minerals in your bones. The test may be used to diagnose or screen you for a condition that causes weak or thin bones (osteoporosis), predict your risk for a broken bone (fracture), or determine how well your osteoporosis treatment is working. The bone density test is recommended for females 65 and older, or females or males <65 if certain risk factors such as thyroid disease, long term use of steroids such as for asthma or rheumatological issues, vitamin D deficiency, estrogen deficiency, family history of osteoporosis, self or family history of fragility fracture in first degree relative.    Heart health: Get at least 150 minutes of aerobic exercise weekly Limit alcohol It is important to maintain a healthy blood pressure and healthy cholesterol numbers  Heart disease screening: Screening for heart disease includes screening for blood pressure, fasting lipids, glucose/diabetes screening, BMI height to weight ratio, reviewed of smoking status, physical activity, and diet.    Goals include blood pressure 120/80 or less, maintaining a healthy lipid/cholesterol profile, preventing diabetes or keeping diabetes numbers under good control, not smoking or using tobacco products, exercising most days per week or at least 150 minutes per week of exercise, and eating healthy variety of fruits and vegetables, healthy oils, and avoiding unhealthy  food choices like fried food, fast food, high sugar and high cholesterol foods.    CT chest 06/28/22: IMPRESSION: 1.  Lung-RADS 2, benign appearance or behavior. Continue annual screening with low-dose chest CT without contrast in 12 months. Which 2. Diffuse bronchial wall thickening with emphysema, as above; imaging findings suggestive of underlying COPD. 3. Coronary artery calcifications. 4. Aortic Atherosclerosis (ICD10-I70.0) and Emphysema (ICD10-J43.9).   Referral for updated abdomina aortic aneurysm screening ultrasound placed today   Medical care options: I recommend you continue to seek care here first for routine care.  We try really hard to have available appointments Monday through Friday daytime hours for sick visits, acute visits, and physicals.  Urgent care should be used for after hours and weekends for significant issues that cannot wait till the next day.  The emergency department should be used for significant potentially life-threatening emergencies.  The emergency department is expensive, can often have long wait times for less significant concerns, so try to utilize primary care, urgent care, or telemedicine when possible to avoid unnecessary trips to the emergency department.  Virtual visits and telemedicine have been introduced since the pandemic started in 2020, and can be convenient ways to receive medical care.  We offer virtual appointments as well to assist you in a variety of options to seek medical care.    Separate significant issues discussed: Vein occlusion and eye issues-continue routine follow-up with ophthalmology  Hyperlipidemia-continue cholesterol medication daily, atorvastatin 40mg  daily.  Labs today.  Eat a healthy low-cholesterol diet  Peripheral arterial disease-I strongly recommend you quit smoking.  Continue walking for exercise.  Continue aspirin and cholesterol pill daily. Follow up with cardiology in 08/2023 as planned  Impaired glucose, prediabetes-updated labs today.  Continue metformin.  Atherosclerosis of aorta-continue cholesterol medication aspirin  daily and I recommend you quit smoking  Tobacco use - your lung CT last year does show COPD which gets worse with time and continued tobacco use.  I strongly recommend you try to stop tobacco.  There are medications such as Wellubtrin, nicotine patches,  and chantix that can be used to help quit smoking.

## 2023-06-22 NOTE — Progress Notes (Signed)
Subjective:    Brent Espinoza is a 72 y.o. male who presents for Preventative Services visit and chronic medical problems/med check visit.    Primary Care Provider Edwyna Dangerfield, Kermit Balo, PA-C here for primary care  Current Health Care Team: Dentist, [place] Dr. Ileene Patrick, GI Dr. Fawn Kirk, Dr. Elise Benne, ophthalmology Dr. Lorine Bears, cardiology Dr. Nita Sells, dermatology   Medical Services you may have received from other than [facility] providers in the past year (date may be approximate) Dermatology- Dr. Margo Aye  Exercise Current exercise habits:  couple times a week  mowing yard, walks regularly.  Nutrition/Diet Current diet: variety, healthy  Depression Screen    06/22/2023    9:16 AM  Depression screen PHQ 2/9  Decreased Interest 0  Down, Depressed, Hopeless 0  PHQ - 2 Score 0    Activities of Daily Living Screen/Functional Status Survey    Can patient draw a clock face showing 3:15 o'clock, yes  Fall Risk Screen    06/22/2023    9:16 AM 05/14/2023    9:44 AM 05/29/2022    9:45 AM 05/28/2021   10:22 AM 07/19/2019   10:13 AM  Fall Risk   Falls in the past year? 0 0 0 0 0  Number falls in past yr: 0 0 0 0   Injury with Fall? 0 0 0 0   Risk for fall due to : No Fall Risks Medication side effect No Fall Risks No Fall Risks   Follow up Falls evaluation completed Falls prevention discussed;Falls evaluation completed Falls evaluation completed Falls evaluation completed     Gait Assessment: Normal gait observed yes  Advanced directives Does patient have a Health Care Power of Attorney? No Does patient have a Living Will? No Still working on this   Past Medical History:  Diagnosis Date   Abnormal CT of the chest 06/08/2018   Allergy    mild   Arthritis    Atherosclerosis of aorta (HCC) 06/08/2018   Cancer (HCC) 2022   skin cancer   Cataract    removed right eye   Coronary artery disease involving native heart without angina  pectoris 06/08/2018   Diabetes mellitus without complication (HCC)    "pre diabetes"   Erectile dysfunction 06/08/2018   Eye disease 02/18/2017   GERD (gastroesophageal reflux disease)    occ   HDL deficiency 02/19/2017   Hyperlipidemia    Impacted cerumen of right ear 02/18/2017   Leukocytosis 02/19/2017   Mixed dyslipidemia 02/19/2017   Overweight 02/18/2017   Smoking    Tobacco use 02/18/2017    Past Surgical History:  Procedure Laterality Date   COLONOSCOPY     EYE SURGERY     gets therapy injections for vessel disease, hx/o cataract surgery   MOUTH SURGERY      Social History   Socioeconomic History   Marital status: Divorced    Spouse name: Not on file   Number of children: Not on file   Years of education: Not on file   Highest education level: Not on file  Occupational History   Not on file  Tobacco Use   Smoking status: Every Day    Current packs/day: 1.00    Average packs/day: 1 pack/day for 51.0 years (51.0 ttl pk-yrs)    Types: Cigarettes   Smokeless tobacco: Never  Vaping Use   Vaping status: Never Used  Substance and Sexual Activity   Alcohol use: Yes    Comment: occ  Drug use: No   Sexual activity: Not on file  Other Topics Concern   Not on file  Social History Narrative   Alone.   Retired as of 2018.   Was at [employer].   Exercise - yard work, Programmer, applications around, walks.  Eats relatively healthy.   Has 2 children, 1 in [place], 1 in [place], 4 grandchildren.  Talks with his child in [place] every week.  Collects coins, other hobbies.  06/2023   Social Determinants of Health   Financial Resource Strain: Low Risk  (06/22/2023)   Overall Financial Resource Strain (CARDIA)    Difficulty of Paying Living Expenses: Not very hard  Food Insecurity: No Food Insecurity (06/22/2023)   Hunger Vital Sign    Worried About Running Out of Food in the Last Year: Never true    Ran Out of Food in the Last Year: Never true  Transportation Needs: No Transportation  Needs (06/22/2023)   PRAPARE - Administrator, Civil Service (Medical): No    Lack of Transportation (Non-Medical): No  Physical Activity: Insufficiently Active (06/22/2023)   Exercise Vital Sign    Days of Exercise per Week: 2 days    Minutes of Exercise per Session: 20 min  Stress: No Stress Concern Present (05/14/2023)   [employer] of Occupational Health - Occupational Stress Questionnaire    Feeling of Stress : Not at all  Social Connections: Unknown (06/22/2023)   Social Connection and Isolation Panel [NHANES]    Frequency of Communication with Friends and Family: Twice a week    Frequency of Social Gatherings with Friends and Family: Not on file    Attends Religious Services: 1 to 4 times per year    Active Member of [employer] or Organizations: No    Attends Banker Meetings: Never    Marital Status: Divorced  Catering manager Violence: Not At Risk (06/22/2023)   Humiliation, Afraid, Rape, and Kick questionnaire    Fear of Current or Ex-Partner: No    Emotionally Abused: No    Physically Abused: No    Sexually Abused: No    Family History  Problem Relation Age of Onset   Other Mother        eye disease, died of old age   Other Father        health history unknown   Cancer Neg Hx    Diabetes Neg Hx    Heart disease Neg Hx    Stroke Neg Hx    Hyperlipidemia Neg Hx    Hypertension Neg Hx    Colon cancer Neg Hx    Colon polyps Neg Hx    Rectal cancer Neg Hx    Stomach cancer Neg Hx      Current Outpatient Medications:    aspirin EC 81 MG tablet, Take 1 tablet (81 mg total) by mouth daily., Disp: 90 tablet, Rfl: 3   atorvastatin (LIPITOR) 40 MG tablet, TAKE 1 TABLET EVERY DAY, Disp: 90 tablet, Rfl: 1   metFORMIN (GLUCOPHAGE) 500 MG tablet, TAKE 1 TABLET EVERY DAY WITH BREAKFAST, Disp: 90 tablet, Rfl: 0   naproxen sodium (ALEVE) 220 MG tablet, Take 220 mg by mouth. Takes PRN (Patient not taking: Reported on 06/22/2023), Disp: , Rfl:   Allergies   Allergen Reactions   Penicillins     As child     History reviewed: allergies, current medications, past family history, past medical history, past social history, past surgical history and problem list  Chronic issues discussed:  Hyperlipidemia-he is compliant with Lipitor 40 mg daily and aspirin daily  Impaired glucose-compliant with metformin 1000 mg daily  Acute issues discussed: No new issues  Objective:   Biometrics BP 138/80 (BP Location: Right Arm, Cuff Size: Normal)   Pulse 60   Ht 5\' 7"  (1.702 m)   Wt 161 lb 3.2 oz (73.1 kg)   BMI 25.25 kg/m   Wt Readings from Last 3 Encounters:  06/22/23 161 lb 3.2 oz (73.1 kg)  02/23/23 160 lb (72.6 kg)  05/29/22 161 lb 3.2 oz (73.1 kg)   Gen: wd, wn nad, tobacco odor Skin: Flaky irritated skin along the face in general chronic, scattered small benign hemangiomas of torso, no worrisome lesion Scattered macules, no worrisome lesions HEENT: normocephalic, sclerae anicteric, nares patent, no discharge or erythema, pharynx normal Oral cavity: MMM, no lesions Neck: supple, no lymphadenopathy, no thyromegaly, no masses, no bruits Heart: RRR, normal S1, S2, no murmurs Lungs: Decreased lower lung fields, otherwise clear without wheezes or rhonchi or rales.   Abdomen: +bs, soft, non tender, non distended, no masses, no hepatomegaly, no splenomegaly, no bruits Musculoskeletal: nontender, no swelling, no obvious deformity Extremities: no edema, no cyanosis, no clubbing Pulses: 2+ symmetric, upper and 1+ lower extremities, normal cap refill Neurological: alert, oriented x 3, CN2-12 intact, strength normal upper extremities and lower extremities, sensation normal throughout, DTRs 2+ throughout, no cerebellar signs, gait normal Psychiatric: normal affect, behavior normal, pleasant  GU/rectal - deferred/declined   Assessment:   Encounter Diagnoses  Name Primary?   Encounter for health maintenance examination in adult Yes   Tobacco  use    Vaccine counseling    PAD (peripheral artery disease) (HCC)    Mixed dyslipidemia    Impaired fasting blood sugar    Erectile dysfunction, unspecified erectile dysfunction type    Coronary artery disease involving native coronary artery of native heart without angina pectoris    Atherosclerosis of aorta [facility])    Anxiety    Advance directive discussed with patient    Screening for lung cancer      Plan:     This visit was a preventative care visit, also known as wellness visit or routine physical.   Topics typically include healthy lifestyle, diet, exercise, preventative care, vaccinations, sick and well care, proper use of emergency dept and after hours care, as well as other concerns.     Recommendations: Continue to return yearly for your annual wellness and preventative care visits.  This gives [place] a chance to discuss healthy lifestyle, exercise, vaccinations, review your chart record, and perform screenings where appropriate.  I recommend you see your eye doctor yearly for routine vision care.  I recommend you see your dentist yearly for routine dental care including hygiene visits twice yearly.   Vaccination recommendations were reviewed Immunization History  Administered Date(s) Administered   Fluad Quad(high Dose 65+) 06/13/2019   Influenza Split 10/10/2013   Influenza, High Dose Seasonal PF 07/13/2017, 08/01/2018, 08/01/2018   Influenza,inj,Quad PF,6+ Mos 08/21/2014   Influenza-Unspecified 06/24/2016, 06/30/2021   PFIZER Comirnaty(Gray Top)Covid-19 Tri-Sucrose Vaccine 01/18/2020, 02/12/2020, 09/19/2020   Pneumococcal Conjugate-13 06/24/2016   He [facility]  Declines shingrix, tdap and prenvar 20 vaccines   Screening for cancer: Colon cancer screening: I reviewed your colonoscopy on file that is up to date from 07/2021.  Skin cancer screening: Check your skin regularly for new changes, growing lesions, or other  lesions of concern Come in for evaluation  if you have skin lesions of concern. Continue routine follow-up with dermatology  Lung cancer screening: If you have a greater than 20 pack year history of tobacco use, then you may qualify for lung cancer screening with a chest CT scan.   Please call your insurance company to inquire about coverage for this test.  CT chest lung cancer screen updated order today  We currently don't have screenings for other cancers besides breast, cervical, colon, and lung cancers.  If you have a strong family history of cancer or have other cancer screening concerns, please let me know.    Bone health: Get at least 150 minutes of aerobic exercise weekly Get weight bearing exercise at least once weekly Bone density test:  A bone density test is an imaging test that uses a type of X-ray to measure the amount of calcium and other minerals in your bones. The test may be used to diagnose or screen you for a condition that causes weak or thin bones (osteoporosis), predict your risk for a broken bone (fracture), or determine how well your osteoporosis treatment is working. The bone density test is recommended for females 65 and older, or females or males <65 if certain risk factors such as thyroid disease, long term use of steroids such as for asthma or rheumatological issues, vitamin D deficiency, estrogen deficiency, family history of osteoporosis, self or family history of fragility fracture in first degree relative.    Heart health: Get at least 150 minutes of aerobic exercise weekly Limit alcohol It is important to maintain a healthy blood pressure and healthy cholesterol numbers  Heart disease screening: Screening for heart disease includes screening for blood pressure, fasting lipids, glucose/diabetes screening, BMI height to weight ratio, reviewed of smoking status, physical activity, and diet.    Goals include blood pressure 120/80 or less, maintaining a  healthy lipid/cholesterol profile, preventing diabetes or keeping diabetes numbers under good control, not smoking or using tobacco products, exercising most days per week or at least 150 minutes per week of exercise, and eating healthy variety of fruits and vegetables, healthy oils, and avoiding unhealthy food choices like fried food, fast food, high sugar and high cholesterol foods.    CT chest 06/28/22: IMPRESSION: 1. Lung-RADS 2, benign appearance or behavior. Continue annual screening with low-dose chest CT without contrast in 12 months. Which 2. Diffuse bronchial wall thickening with emphysema, as above; imaging findings suggestive of underlying COPD. 3. Coronary artery calcifications. 4. Aortic Atherosclerosis (ICD10-I70.0) and Emphysema (ICD10-J43.9).   Referral for updated abdomina aortic aneurysm screening ultrasound placed today   Medical care options: I recommend you continue to seek care here first for routine care.  We try really hard to have available appointments Monday through Friday daytime hours for sick visits, acute visits, and physicals.  Urgent care should be used for after hours and weekends for significant issues that cannot wait till the next day.  The emergency department should be used for significant potentially life-threatening emergencies.  The emergency department is expensive, can often have long wait times for less significant concerns, so try to utilize primary care, urgent care, or telemedicine when possible to avoid unnecessary trips to the emergency department.  Virtual visits and telemedicine have been introduced since the pandemic started in 2020, and can be convenient ways to receive medical care.  We offer virtual appointments as well to assist you in a variety of options to seek medical care.    Separate significant issues discussed: Vein occlusion and  eye issues-continue routine follow-up with ophthalmology  Hyperlipidemia-continue cholesterol  medication daily, atorvastatin 40mg  daily.  Labs today.  Eat a healthy low-cholesterol diet  Peripheral arterial disease-I strongly recommend you quit smoking.  Continue walking for exercise.  Continue aspirin and cholesterol pill daily. Follow up with cardiology in 08/2023 as planned  Impaired glucose, prediabetes-updated labs today.  Continue metformin.  Atherosclerosis of aorta-continue cholesterol medication aspirin daily and I recommend you quit smoking  Tobacco use - your lung CT last year does show COPD which gets worse with time and continued tobacco use.  I strongly recommend you try to stop tobacco.  There are medications such as Wellubtrin, nicotine patches,  and chantix that can be used to help quit smoking.     Brent Covert "WAYNE" was seen today for annual exam.  Diagnoses and all orders for this visit:  Encounter for health maintenance examination in adult -     Hepatitis C antibody -     Cancel: CT CHEST NODULE FOLLOW UP LOW DOSE W/O; Future -     Comprehensive metabolic panel -     CBC -     Lipid panel -     Hemoglobin A1c -     Cancel: POCT Urinalysis DIP (Proadvantage Device) -     CT CHEST LUNG CA SCREEN LOW DOSE W/O CM; Future  Tobacco use -     Cancel: CT CHEST NODULE FOLLOW UP LOW DOSE W/O; Future -     CT CHEST LUNG CA SCREEN LOW DOSE W/O CM; Future -     VAS [place] AAA DUPLEX; Future  Vaccine counseling  PAD (peripheral artery disease) (HCC) -     VAS [place] AAA DUPLEX; Future  Mixed dyslipidemia -     Lipid panel  Impaired fasting blood sugar -     Hemoglobin A1c  Erectile dysfunction, unspecified erectile dysfunction type  Coronary artery disease involving native coronary artery of native heart without angina pectoris -     VAS [place] AAA DUPLEX; Future  Atherosclerosis of aorta (HCC) -     VAS [place] AAA DUPLEX; Future  Anxiety  Advance directive discussed with patient  Screening for lung cancer -     Cancel: CT CHEST NODULE FOLLOW UP LOW DOSE W/O;  Future -     CT CHEST LUNG CA SCREEN LOW DOSE W/O CM; Future     F/u pending labs and scans

## 2023-06-23 ENCOUNTER — Other Ambulatory Visit: Payer: Self-pay | Admitting: Medical

## 2023-06-23 DIAGNOSIS — E782 Mixed hyperlipidemia: Secondary | ICD-10-CM

## 2023-06-23 DIAGNOSIS — Z720 Tobacco use: Secondary | ICD-10-CM

## 2023-06-23 LAB — COMPREHENSIVE METABOLIC PANEL
ALT: 17 IU/L (ref 0–44)
AST: 19 IU/L (ref 0–40)
Albumin: 4.3 g/dL (ref 3.8–4.8)
Alkaline Phosphatase: 115 IU/L (ref 44–121)
BUN/Creatinine Ratio: 15 (ref 10–24)
BUN: 14 mg/dL (ref 8–27)
Bilirubin Total: 0.3 mg/dL (ref 0.0–1.2)
CO2: 25 mmol/L (ref 20–29)
Calcium: 9.5 mg/dL (ref 8.6–10.2)
Chloride: 101 mmol/L (ref 96–106)
Creatinine, Ser: 0.94 mg/dL (ref 0.76–1.27)
Globulin, Total: 3.2 g/dL (ref 1.5–4.5)
Glucose: 73 mg/dL (ref 70–99)
Potassium: 4.5 mmol/L (ref 3.5–5.2)
Sodium: 139 mmol/L (ref 134–144)
Total Protein: 7.5 g/dL (ref 6.0–8.5)
eGFR: 86 mL/min/{1.73_m2} (ref >59)

## 2023-06-23 LAB — CBC
Hematocrit: 46.7 % (ref 37.5–51.0)
Hemoglobin: 15.6 g/dL (ref 13.0–17.7)
MCH: 28 pg (ref 26.6–33.0)
MCHC: 33.4 g/dL (ref 31.5–35.7)
MCV: 84 fL (ref 79–97)
Platelets: 276 10*3/uL (ref 150–450)
RBC: 5.58 x10E6/uL (ref 4.14–5.80)
RDW: 14.2 % (ref 11.6–15.4)
WBC: 9.5 10*3/uL (ref 3.4–10.8)

## 2023-06-23 LAB — HEMOGLOBIN A1C
Est. average glucose Bld gHb Est-mCnc: 134 mg/dL
Hgb A1c MFr Bld: 6.3 % — ABNORMAL HIGH (ref 4.8–5.6)

## 2023-06-23 LAB — LIPID PANEL
Chol/HDL Ratio: 3.3 ratio (ref 0.0–5.0)
Cholesterol, Total: 89 mg/dL — ABNORMAL LOW (ref 100–199)
HDL: 27 mg/dL — ABNORMAL LOW (ref >39)
LDL Chol Calc (NIH): 46 mg/dL (ref 0–99)
Triglycerides: 76 mg/dL (ref 0–149)
VLDL Cholesterol Cal: 16 mg/dL (ref 5–40)

## 2023-06-23 LAB — HEPATITIS C ANTIBODY: Hep C Virus Ab: NONREACTIVE

## 2023-06-23 MED ORDER — ASPIRIN 81 MG PO TBEC: 81.0000 mg | DELAYED_RELEASE_TABLET | Freq: Every day | ORAL | 3 refills | Status: DC

## 2023-06-23 MED ORDER — ATORVASTATIN CALCIUM 40 MG PO TABS
40.0000 mg | ORAL_TABLET | Freq: Every day | ORAL | 3 refills | Status: DC
Start: 2023-06-23 — End: 2023-09-13

## 2023-06-23 MED ORDER — METFORMIN HCL 500 MG PO TABS: ORAL_TABLET | ORAL | 3 refills | Status: DC

## 2023-06-23 NOTE — Progress Notes (Signed)
Results sent through MyChart

## 2023-06-29 ENCOUNTER — Other Ambulatory Visit: Payer: Self-pay | Admitting: Medical

## 2023-06-29 ENCOUNTER — Telehealth: Payer: Self-pay

## 2023-06-29 MED ORDER — FENOFIBRATE 145 MG PO TABS: 145.0000 mg | ORAL_TABLET | Freq: Every day | ORAL | 0 refills | Status: DC

## 2023-06-29 NOTE — Telephone Encounter (Signed)
This was meant for you.

## 2023-06-29 NOTE — Telephone Encounter (Signed)
Pt would like to start fenofibrate, if Vincenza Hews suggests. Requests that the rx is sent through [facility]. Requesting a call back if rx is sent in. Last appt. 06/22/23. Next appt. 06/25/24.

## 2023-06-30 NOTE — Telephone Encounter (Signed)
Called pt, no answer, left voicemail.

## 2023-07-05 ENCOUNTER — Encounter: Payer: Self-pay | Admitting: Medical

## 2023-07-09 ENCOUNTER — Ambulatory Visit (HOSPITAL_COMMUNITY)
Admission: RE | Admit: 2023-07-09 | Discharge: 2023-07-09 | Disposition: A | Discharge status: Home / Self Care | Payer: Medicare HMO | Source: Ambulatory Visit | Attending: Medical | Admitting: Medical

## 2023-07-09 DIAGNOSIS — I2510 Atherosclerotic heart disease of native coronary artery without angina pectoris: Secondary | ICD-10-CM | POA: Diagnosis not present

## 2023-07-09 DIAGNOSIS — I739 Peripheral vascular disease, unspecified: Secondary | ICD-10-CM | POA: Insufficient documentation

## 2023-07-09 DIAGNOSIS — I700 Atherosclerosis of aorta: Secondary | ICD-10-CM | POA: Insufficient documentation

## 2023-07-09 DIAGNOSIS — Z720 Tobacco use: Secondary | ICD-10-CM | POA: Diagnosis not present

## 2023-07-09 NOTE — Progress Notes (Signed)
Results sent through MyChart

## 2023-07-09 NOTE — Progress Notes (Signed)
He had a vascular ultrasound for screening for abdominal aortic aneurysm.  The neck still shows preliminary result not final.  So far there is no signs of aneurysm.  I am expecting a final report at some point soon

## 2023-07-12 DIAGNOSIS — H35352 Cystoid macular degeneration, left eye: Secondary | ICD-10-CM | POA: Diagnosis not present

## 2023-07-12 DIAGNOSIS — H353114 Nonexudative age-related macular degeneration, right eye, advanced atrophic with subfoveal involvement: Secondary | ICD-10-CM | POA: Diagnosis not present

## 2023-07-12 DIAGNOSIS — H35371 Puckering of macula, right eye: Secondary | ICD-10-CM | POA: Diagnosis not present

## 2023-07-12 DIAGNOSIS — H43822 Vitreomacular adhesion, left eye: Secondary | ICD-10-CM | POA: Diagnosis not present

## 2023-07-12 DIAGNOSIS — H2512 Age-related nuclear cataract, left eye: Secondary | ICD-10-CM | POA: Diagnosis not present

## 2023-07-12 DIAGNOSIS — H353121 Nonexudative age-related macular degeneration, left eye, early dry stage: Secondary | ICD-10-CM | POA: Diagnosis not present

## 2023-07-12 DIAGNOSIS — H34811 Central retinal vein occlusion, right eye, with macular edema: Secondary | ICD-10-CM | POA: Diagnosis not present

## 2023-07-13 ENCOUNTER — Inpatient Hospital Stay
Admission: RE | Admit: 2023-07-13 | Discharge: 2023-07-13 | Disposition: A | Discharge status: Home / Self Care | Payer: Medicare HMO | Source: Ambulatory Visit | Attending: Medical | Admitting: Medical

## 2023-07-13 DIAGNOSIS — Z0000 Encounter for general adult medical examination without abnormal findings: Secondary | ICD-10-CM

## 2023-07-13 DIAGNOSIS — Z720 Tobacco use: Secondary | ICD-10-CM

## 2023-07-13 DIAGNOSIS — F17210 Nicotine dependence, cigarettes, uncomplicated: Secondary | ICD-10-CM | POA: Diagnosis not present

## 2023-07-13 DIAGNOSIS — Z122 Encounter for screening for malignant neoplasm of respiratory organs: Secondary | ICD-10-CM

## 2023-07-30 ENCOUNTER — Other Ambulatory Visit: Payer: Self-pay | Admitting: Medical

## 2023-07-30 DIAGNOSIS — R911 Solitary pulmonary nodule: Secondary | ICD-10-CM

## 2023-07-30 NOTE — Progress Notes (Signed)
CT scan shows an area of concern.  They recommend a follow-up CT in 3 months.  Instead I want to go ahead and refer you to pulmonology to get their consult and opinion on this

## 2023-08-12 ENCOUNTER — Ambulatory Visit (INDEPENDENT_AMBULATORY_CARE_PROVIDER_SITE_OTHER): Payer: Medicare HMO | Admitting: Medical

## 2023-08-12 VITALS — BP 124/82 | HR 71 | Wt 158.6 lb

## 2023-08-12 DIAGNOSIS — I739 Peripheral vascular disease, unspecified: Secondary | ICD-10-CM

## 2023-08-12 DIAGNOSIS — Z720 Tobacco use: Secondary | ICD-10-CM

## 2023-08-12 DIAGNOSIS — R911 Solitary pulmonary nodule: Secondary | ICD-10-CM

## 2023-08-12 DIAGNOSIS — R7301 Impaired fasting glucose: Secondary | ICD-10-CM | POA: Diagnosis not present

## 2023-08-12 DIAGNOSIS — R9389 Abnormal findings on diagnostic imaging of other specified body structures: Secondary | ICD-10-CM

## 2023-08-12 DIAGNOSIS — E782 Mixed hyperlipidemia: Secondary | ICD-10-CM

## 2023-08-12 DIAGNOSIS — Z136 Encounter for screening for cardiovascular disorders: Secondary | ICD-10-CM | POA: Diagnosis not present

## 2023-08-12 NOTE — Progress Notes (Signed)
Subjective:  Brent Espinoza is a 72 y.o. male who presents for Chief Complaint  Patient presents with   Follow-up    Follow-up on labs and recheck     Here for recheck.  I saw him recently for physical.  At that time given persistent low HDL we added fenofibrate.  He was already on statin atorvastatin 40 mg daily and aspirin 81 mg daily.  He is taking the fenofibrate without complaint.  He ate 2 muffins today so not completely fasting.  He had a abdominal aortic aneurysm screening recently.  Here to follow-up on results  He had a CT chest lung cancer screening recently that showed a pulmonary nodule.  Here to discuss results  He had already has an appointment time to rescan in January 2025  He is compliant with medications.  He continues to smoke.  Not quite ready to quit.  He has tried nicotine gum before but did not really see improvements.  No other aggravating or relieving factors.    No other c/o.  The following portions of the patient's history were reviewed and updated as appropriate: allergies, current medications, past family history, past medical history, past social history, past surgical history and problem list.  ROS Otherwise as in subjective above  Objective: BP 124/82   Pulse 71   Wt 158 lb 9.6 oz (71.9 kg)   BMI 24.84 kg/m   General appearance: alert, no distress, well developed, well nourished Heart: RRR, normal S1, S2, no murmurs Lungs: CTA bilaterally, no wheezes, rhonchi, or rales Abdomen: +bs, soft, non tender, non distended, no masses, no hepatomegaly, no splenomegaly Bruits present of both inguinal region and over the iliacs Pulses: 2+ radial pulses, 2+ pedal pulses, normal cap refill Ext: no edema   CT chest 07/13/23 IMPRESSION: 1. Lung-RADS 4A, suspicious. Follow up low-dose chest CT without contrast in 3 months (please use the following order, "CT CHEST LCS NODULE FOLLOW-UP W/O CM") is recommended. New right apical pulmonary nodule of  6.5 mm. 2. Aortic atherosclerosis (ICD10-I70.0), coronary artery atherosclerosis and emphysema (ICD10-J43.9).   AAA screen 07/09/23 showed no AAA, but there was >50% narrowing of left common iliac    Assessment: Encounter Diagnoses  Name Primary?   Mixed dyslipidemia Yes   Pulmonary nodule    Tobacco use    Screening for AAA (abdominal aortic aneurysm)    PAD (peripheral artery disease) (HCC)    Impaired fasting blood sugar    Abnormal CT of the chest      Plan: Mixed dyslipidemia-last visit we continued atorvastatin 40 mg daily, aspirin 81 mg daily and initiated fenofibrate given low HDL.  Here to follow-up on this.  Labs today.  If HDL not improved then we will discontinue fenofibrate.  Tobacco use-counseled on cessation.  We discussed potential medication options.  He has failed Nicorette gum.  He is not ready to quit but has cut down  Screening for AAA recently showed no AAA but did show more than 50% stenosis of left common iliac.  He has bruits of both iliac today.  He already sees vascular surgery.  He has follow-up on November 12 with vascular surgery to discuss  Impaired glucose-continue metformin efforts to eat healthy low sugar diet  Abnormal CT of chest showing pulmonary nodule recently.  He has a repeat scan in January.  We did discuss the possibility of a lung tumor.  Again reiterated the need to quit smoking.  He is already scheduled for January for repeat scan  Brent "WAYNE" was seen today for follow-up.  Diagnoses and all orders for this visit:  Mixed dyslipidemia -     Lipid panel -     Hepatic function panel  Pulmonary nodule  Tobacco use  Screening for AAA (abdominal aortic aneurysm)  PAD (peripheral artery disease) (HCC)  Impaired fasting blood sugar  Abnormal CT of the chest    Follow up: pending labs

## 2023-08-13 ENCOUNTER — Other Ambulatory Visit: Payer: Self-pay | Admitting: Medical

## 2023-08-13 LAB — HEPATIC FUNCTION PANEL
ALT: 21 [IU]/L (ref 0–44)
AST: 26 [IU]/L (ref 0–40)
Albumin: 4.4 g/dL (ref 3.8–4.8)
Alkaline Phosphatase: 60 [IU]/L (ref 44–121)
Bilirubin Total: 0.4 mg/dL (ref 0.0–1.2)
Bilirubin, Direct: 0.15 mg/dL (ref 0.00–0.40)
Total Protein: 7.4 g/dL (ref 6.0–8.5)

## 2023-08-13 LAB — LIPID PANEL
Chol/HDL Ratio: 3.3 ratio (ref 0.0–5.0)
Cholesterol, Total: 79 mg/dL — ABNORMAL LOW (ref 100–199)
HDL: 24 mg/dL — ABNORMAL LOW (ref >39)
LDL Chol Calc (NIH): 42 mg/dL (ref 0–99)
Triglycerides: 53 mg/dL (ref 0–149)
VLDL Cholesterol Cal: 13 mg/dL (ref 5–40)

## 2023-08-13 NOTE — Progress Notes (Signed)
Results sent through MyChart

## 2023-08-17 DIAGNOSIS — D225 Melanocytic nevi of trunk: Secondary | ICD-10-CM | POA: Diagnosis not present

## 2023-08-17 DIAGNOSIS — L570 Actinic keratosis: Secondary | ICD-10-CM | POA: Diagnosis not present

## 2023-08-17 DIAGNOSIS — X32XXXD Exposure to sunlight, subsequent encounter: Secondary | ICD-10-CM | POA: Diagnosis not present

## 2023-08-24 ENCOUNTER — Ambulatory Visit: Payer: Medicare HMO | Attending: Cardiovascular Disease | Admitting: Cardiovascular Disease

## 2023-08-24 ENCOUNTER — Encounter: Payer: Self-pay | Admitting: Cardiovascular Disease

## 2023-08-24 VITALS — BP 166/80 | HR 58 | Ht 68.0 in | Wt 162.0 lb

## 2023-08-24 DIAGNOSIS — Z720 Tobacco use: Secondary | ICD-10-CM

## 2023-08-24 DIAGNOSIS — E785 Hyperlipidemia, unspecified: Secondary | ICD-10-CM

## 2023-08-24 DIAGNOSIS — I2510 Atherosclerotic heart disease of native coronary artery without angina pectoris: Secondary | ICD-10-CM

## 2023-08-24 DIAGNOSIS — I739 Peripheral vascular disease, unspecified: Secondary | ICD-10-CM | POA: Diagnosis not present

## 2023-08-24 NOTE — Patient Instructions (Addendum)
Medication Instructions:  No changes *If you need a refill on your cardiac medications before your next appointment, please call your pharmacy*   Lab Work: None ordered If you have labs (blood work) drawn today and your tests are completely normal, you will receive your results only by: MyChart Message (if you have MyChart) OR A paper copy in the mail If you have any lab test that is abnormal or we need to change your treatment, we will call you to review the results.   Testing/Procedures: None ordered   Follow-Up: At [facility], you and your health needs are our priority.  As part of our continuing mission to provide you with exceptional heart care, we have created designated Provider Care Teams.  These Care Teams include your primary Cardiologist (physician) and Advanced Practice Providers (APPs -  Physician Assistants and Nurse Practitioners) who all work together to provide you with the care you need, when you need it.  We recommend signing up for the patient portal called "MyChart".  Sign up information is provided on this After Visit Summary.  MyChart is used to connect with patients for Virtual Visits (Telemedicine).  Patients are able to view lab/test results, encounter notes, upcoming appointments, etc.  Non-urgent messages can be sent to your provider as well.   To learn more about what you can do with MyChart, go to [email or web address].    Your next appointment:   12 month(s)  Provider:   Dr. Kirke Corin  Managing the Challenge of Quitting Smoking Quitting smoking is a physical and mental challenge. You may have cravings, withdrawal symptoms, and temptation to smoke. Before quitting, work with your health care provider to make a plan that can help you manage quitting. Making a plan before you quit may keep you from smoking when you have the urge to smoke while trying to quit. How to manage lifestyle changes Managing stress Stress can make you want to smoke, and  wanting to smoke may cause stress. It is important to find ways to manage your stress. You could try some of the following: Practice relaxation techniques. Breathe slowly and deeply, in through your nose and out through your mouth. Listen to music. Soak in a bath or take a shower. Imagine a peaceful place or vacation. Get some support. Talk with family or friends about your stress. Join a support group. Talk with a counselor or therapist. Get some physical activity. Go for a walk, run, or bike ride. Play a favorite sport. Practice yoga.  Medicines Talk with your health care provider about medicines that might help you deal with cravings and make quitting easier for you. Relationships Social situations can be difficult when you are quitting smoking. To manage this, you can: Avoid parties and other social situations where people might be smoking. Avoid alcohol. Leave right away if you have the urge to smoke. Explain to your family and friends that you are quitting smoking. Ask for support and let them know you might be a bit grumpy. Plan activities where smoking is not an option. General instructions Be aware that many people gain weight after they quit smoking. However, not everyone does. To keep from gaining weight, have a plan in place before you quit, and stick to the plan after you quit. Your plan should include: Eating healthy snacks. When you have a craving, it may help to: Eat popcorn, or try carrots, celery, or other cut vegetables. Chew sugar-free gum. Changing how you eat. Eat small portion sizes at meals.  Eat 4-6 small meals throughout the day instead of 1-2 large meals a day. Be mindful when you eat. You should avoid watching television or doing other things that might distract you as you eat. Exercising regularly. Make time to exercise each day. If you do not have time for a long workout, do short bouts of exercise for 5-10 minutes several times a day. Do some form of  strengthening exercise, such as weight lifting. Do some exercise that gets your heart beating and causes you to breathe deeply, such as walking fast, running, swimming, or biking. This is very important. Drinking plenty of water or other low-calorie or no-calorie drinks. Drink enough fluid to keep your urine pale yellow.  How to recognize withdrawal symptoms Your body and mind may experience discomfort as you try to get used to not having nicotine in your system. These effects are called withdrawal symptoms. They may include: Feeling hungrier than normal. Having trouble concentrating. Feeling irritable or restless. Having trouble sleeping. Feeling depressed. Craving a cigarette. These symptoms may surprise you, but they are normal to have when quitting smoking. To manage withdrawal symptoms: Avoid places, people, and activities that trigger your cravings. Remember why you want to quit. Get plenty of sleep. Avoid coffee and other drinks that contain caffeine. These may worsen some of your symptoms. How to manage cravings Come up with a plan for how to deal with your cravings. The plan should include the following: A definition of the specific situation you want to deal with. An activity or action you will take to replace smoking. A clear idea for how this action will help. The name of someone who could help you with this. Cravings usually last for 5-10 minutes. Consider taking the following actions to help you with your plan to deal with cravings: Keep your mouth busy. Chew sugar-free gum. Suck on hard candies or a straw. Brush your teeth. Keep your hands and body busy. Change to a different activity right away. Squeeze or play with a ball. Do an activity or a hobby, such as making bead jewelry, practicing needlepoint, or working with wood. Mix up your normal routine. Take a short exercise break. Go for a quick walk, or run up and down stairs. Focus on doing something kind or  helpful for someone else. Call a friend or family member to talk during a craving. Join a support group. Contact a quitline. Where to find support To get help or find a support group: Call the National Cancer Institute's Smoking Quitline: 1-800-QUIT-NOW (323)846-1524) Text QUIT to SmokefreeTXT: 147829 Where to find more information Visit these websites to find more information on quitting smoking: U.S. Department of Health and Human Services: www.smokefree.gov American Lung Association: www.freedomfromsmoking.org Centers for Disease Control and Prevention (CDC): FootballExhibition.com.br American Heart Association: www.heart.org Contact a health care provider if: You want to change your plan for quitting. The medicines you are taking are not helping. Your eating feels out of control or you cannot sleep. You feel depressed or become very anxious. Summary Quitting smoking is a physical and mental challenge. You will face cravings, withdrawal symptoms, and temptation to smoke again. Preparation can help you as you go through these challenges. Try different techniques to manage stress, handle social situations, and prevent weight gain. You can deal with cravings by keeping your mouth busy (such as by chewing gum), keeping your hands and body busy, calling family or friends, or contacting a quitline for people who want to quit smoking. You can deal with  withdrawal symptoms by avoiding places where people smoke, getting plenty of rest, and avoiding drinks that contain caffeine. This information is not intended to replace advice given to you by your health care provider. Make sure you discuss any questions you have with your health care provider. Document Revised: 09/19/2021 Document Reviewed: 09/19/2021 Elsevier Patient Education  2024 [employer].

## 2023-08-24 NOTE — Progress Notes (Signed)
Cardiology Office Note   Date:  08/24/2023   ID:  Willson, Grate September 04, 1951, MRN [id number]  PCP:  Jac Canavan, PA-C  Cardiologist:   Lorine Bears, MD   No chief complaint on file.     History of Present Illness: Brent Espinoza is a 72 y.o. male who is here today for af follow up visit regarding PAD.  The patient has prolonged history of smoking and hyperlipidemia.  He smokes 1 pack/day and has been doing so since 72 years old.  He has no family history of coronary artery disease.  He also has known history of coronary artery calcifications noted on prior CT scan He has known history of peripheral arterial disease with mild bilateral calf claudication and erectile dysfunction.   He had noninvasive vascular studies done in 2019 which showed mildly reduced ABI bilaterally in the 0.7 range.  Aortoiliac duplex showed severe bilateral common iliac artery stenosis in addition to an occluded left SFA. The patient did not want to have procedures done and this was treated medically.   He had ultrasound done in September that showed no evidence of aortic aneurysm.  There was evidence of iliac artery disease.  He continues to smoke 1 pack/day.  He denies chest pain or worsening dyspnea.  He reports lower extremity heaviness only with overexertion but not with regular activities.  He does not feel limited by this and can walk as much as he wants.  Past Medical History:  Diagnosis Date   Abnormal CT of the chest 06/08/2018   Allergy    mild   Arthritis    Atherosclerosis of aorta (HCC) 06/08/2018   Cancer (HCC) 2022   skin cancer   Cataract    removed right eye   Coronary artery disease involving native heart without angina pectoris 06/08/2018   Diabetes mellitus without complication (HCC)    "pre diabetes"   Erectile dysfunction 06/08/2018   Eye disease 02/18/2017   GERD (gastroesophageal reflux disease)    occ   HDL deficiency 02/19/2017   Hyperlipidemia     Impacted cerumen of right ear 02/18/2017   Leukocytosis 02/19/2017   Mixed dyslipidemia 02/19/2017   Overweight 02/18/2017   Smoking    Tobacco use 02/18/2017    Past Surgical History:  Procedure Laterality Date   COLONOSCOPY     EYE SURGERY     gets therapy injections for vessel disease, hx/o cataract surgery   MOUTH SURGERY       Current Outpatient Medications  Medication Sig Dispense Refill   aspirin EC 81 MG tablet Take 1 tablet (81 mg total) by mouth daily. 90 tablet 3   atorvastatin (LIPITOR) 40 MG tablet Take 1 tablet (40 mg total) by mouth daily. 90 tablet 3   Coenzyme Q10 (CO Q-10) 100 MG CAPS Take by mouth daily.     metFORMIN (GLUCOPHAGE) 500 MG tablet TAKE 1 TABLET EVERY DAY WITH BREAKFAST 90 tablet 3   naproxen sodium (ALEVE) 220 MG tablet Take 220 mg by mouth. Takes PRN     No current facility-administered medications for this visit.    Allergies:   Penicillins    Social History:  The patient  reports that he has been smoking cigarettes. He has a 51 pack-year smoking history. He has never used smokeless tobacco. He reports current alcohol use. He reports that he does not use drugs.   Family History:  The patient's family history is negative for coronary artery disease, peripheral arterial  disease or cancer.   ROS:  Please see the history of present illness.   Otherwise, review of systems are positive for none.   All other systems are reviewed and negative.    PHYSICAL EXAM: VS:  BP (!) 166/80 (BP Location: Right Arm, Patient Position: Sitting, Cuff Size: Normal)   Pulse (!) 58   Ht 5\' 8"  (1.727 m)   Wt 162 lb (73.5 kg)   SpO2 95%   BMI 24.63 kg/m  , BMI Body mass index is 24.63 kg/m. GEN: Well nourished, well developed, in no acute distress  HEENT: normal  Neck: no JVD, carotid bruits, or masses Cardiac: RRR; no murmurs, rubs, or gallops,no edema  Respiratory:  clear to auscultation bilaterally, normal work of breathing GI: soft, nontender,  nondistended, + BS MS: no deformity or atrophy  Skin: warm and dry, no rash Neuro:  Strength and sensation are intact Psych: euthymic mood, full affect Vascular: Femoral pulses barely palpable bilaterally.  Distal pulses are not palpable.   EKG:  EKG is not ordered today.    Recent Labs: 06/22/2023: BUN 14; Creatinine, Ser 0.94; Hemoglobin 15.6; Platelets 276; Potassium 4.5; Sodium 139 08/12/2023: ALT 21    Lipid Panel    Component Value Date/Time   CHOL 79 (L) 08/12/2023 1048   TRIG 53 08/12/2023 1048   HDL 24 (L) 08/12/2023 1048   CHOLHDL 3.3 08/12/2023 1048   CHOLHDL 5.4 (H) 02/18/2017 0939   VLDL 12 02/18/2017 0939   LDLCALC 42 08/12/2023 1048      Wt Readings from Last 3 Encounters:  08/24/23 162 lb (73.5 kg)  08/12/23 158 lb 9.6 oz (71.9 kg)  06/22/23 161 lb 3.2 oz (73.1 kg)           No data to display            ASSESSMENT AND PLAN:  1.  Peripheral arterial disease: He reports stable mild lower extremity claudication which is due to bilateral iliac disease.  In addition, he is known to have occluded left SFA.  Recent duplex showed no evidence of aortic aneurysm.  His symptoms are still not lifestyle limiting and thus we will continue medical therapy.  I advised him to continue daily walks.  2.  Tobacco use: I again discussed with him the importance of smoking cessation.  3.  Hyperlipidemia: Continue treatment with atorvastatin.  I reviewed his recent lipid profile which showed an LDL of 42.  4.  Coronary atherosclerosis noted on previous CT scan: Currently with no convincing symptoms of angina.  Most recent EKG was normal.  5.  Erectile dysfunction: Likely due to significant bilateral common iliac artery disease.    Disposition:   FU with me in 12 months  Signed,  Lorine Bears, MD  08/24/2023 9:01 AM    [facility]

## 2023-09-11 ENCOUNTER — Other Ambulatory Visit: Payer: Self-pay | Admitting: Cardiovascular Disease

## 2023-09-11 DIAGNOSIS — Z720 Tobacco use: Secondary | ICD-10-CM

## 2023-09-11 DIAGNOSIS — E782 Mixed hyperlipidemia: Secondary | ICD-10-CM

## 2023-09-13 DIAGNOSIS — H353114 Nonexudative age-related macular degeneration, right eye, advanced atrophic with subfoveal involvement: Secondary | ICD-10-CM | POA: Diagnosis not present

## 2023-09-13 DIAGNOSIS — H2512 Age-related nuclear cataract, left eye: Secondary | ICD-10-CM | POA: Diagnosis not present

## 2023-09-13 DIAGNOSIS — H353121 Nonexudative age-related macular degeneration, left eye, early dry stage: Secondary | ICD-10-CM | POA: Diagnosis not present

## 2023-09-13 DIAGNOSIS — H34811 Central retinal vein occlusion, right eye, with macular edema: Secondary | ICD-10-CM | POA: Diagnosis not present

## 2023-09-13 DIAGNOSIS — H35371 Puckering of macula, right eye: Secondary | ICD-10-CM | POA: Diagnosis not present

## 2023-09-13 DIAGNOSIS — H35352 Cystoid macular degeneration, left eye: Secondary | ICD-10-CM | POA: Diagnosis not present

## 2023-09-13 DIAGNOSIS — H43822 Vitreomacular adhesion, left eye: Secondary | ICD-10-CM | POA: Diagnosis not present

## 2023-10-18 ENCOUNTER — Ambulatory Visit: Payer: Medicare HMO | Admitting: Nurse Practitioner

## 2023-10-18 ENCOUNTER — Encounter: Payer: Self-pay | Admitting: Nurse Practitioner

## 2023-10-18 VITALS — BP 138/84 | HR 65 | Temp 97.8°F | Ht 67.0 in | Wt 163.6 lb

## 2023-10-18 DIAGNOSIS — F17210 Nicotine dependence, cigarettes, uncomplicated: Secondary | ICD-10-CM | POA: Diagnosis not present

## 2023-10-18 DIAGNOSIS — R911 Solitary pulmonary nodule: Secondary | ICD-10-CM | POA: Diagnosis not present

## 2023-10-18 DIAGNOSIS — R053 Chronic cough: Secondary | ICD-10-CM | POA: Diagnosis not present

## 2023-10-18 DIAGNOSIS — J432 Centrilobular emphysema: Secondary | ICD-10-CM | POA: Diagnosis not present

## 2023-10-18 NOTE — Assessment & Plan Note (Addendum)
 New 6.5 mm right apical lung nodule, Lung RADS 4A, visualized on lung cancer screening CT chest 07/13/2023. Discussed possibility of malignancy given smoking history and appearance. We also reviewed potential next steps, dependent on appearance of the nodule, including PET imaging, bronchoscopy, referral for surgical resection with thoracic surgery, or continued surveillance. Plan for repeat Super D CT chest in next 1-2 weeks for further evaluation. He is hesitant to have any procedures at this time but is open to further discussions, pending results. He has no desire to quit smoking at this time. We did discuss that if this appears to be a cancerous lesion and surgical resection is an option, he would need to quit smoking. He will also need PFTs to assess lung function and DLCO, which we will order today. Will review results/discuss next steps with Dr. Brenna at follow up.  Patient Instructions  Your lung nodule is consider suspicious and will need a repeat CT chest for further evaluation. Someone should contact you in a few days to schedule this in 1-2 weeks. Depending on the nodule size and growth, we will decide on next steps which may include a bronchoscopy, referral to thoracic surgery for surgical options, or continuing to monitor with CTs.  We will also get lung function testing on you - someone should contact you for this  Follow up with Dr. Brenna to review CT scan results and decide next steps, or sooner, if needed

## 2023-10-18 NOTE — Patient Instructions (Addendum)
 Your lung nodule is consider suspicious and will need a repeat CT chest for further evaluation. Someone should contact you in a few days to schedule this in 1-2 weeks. Depending on the nodule size and growth, we will decide on next steps which may include a bronchoscopy, referral to thoracic surgery for surgical options, or continuing to monitor with CTs.  We will also get lung function testing on you - someone should contact you for this  Follow up with Dr. Brenna to review CT scan results and decide next steps, or sooner, if needed

## 2023-10-18 NOTE — Assessment & Plan Note (Signed)
 Emphysema and evidence of chronic bronchitis on CT imaging. Chronic cough present, which he reports at non-bothersome. Smoking cessation advised. No indication for scheduled bronchodilator regimen at this time. Remain up to date on vaccines. Avoid sick exposures. Aware to notify if he develops new/worsening respiratory symptoms.

## 2023-10-18 NOTE — Progress Notes (Signed)
 @Patient  ID: Brent Espinoza, male    DOB: 05-04-1951, 73 y.o.   MRN: [id number]  Chief Complaint  Patient presents with   Consult    Review CT lung cancer screening from 07/13/2023.  No sx noted.    Referring provider: Bulah Alm GORMAN DEVONNA  HPI: 73 year old male, active smoker referred for lung nodule. Past medical history significant for CAD, PAD, anxiety, macular degeneration, HLD.   TEST/EVENTS:  06/26/2022 LDCT chest lung cancer screen: atherosclerosis. No LAD. Emphysema. Bronchial wall thickening. Multiple calcified and noncalcified lung nodules, stable. Largest 4 mm in LLL. Lung RADS 2 07/13/2023 LDCT chest lung cancer screen: atherosclerosis. No LAD. Emphysema. Right apical nodule, 6.5 mm, new. Lung RADS 4A, suspicious.   10/18/2023: Today - consult Patient presents today for consult regarding lung nodule. He has been followed with lung cancer screening CTs ordered by his PCP. His last scan in October 2024 revealed a new 6.5 mm right apical nodule, Lung RADs 4A suspicious. He has not had a repeat scan since this time. He is an active smoker with 51 pack year history. Smoking a pack a day. He denies any DOE, hemoptysis, anorexia, weight loss, night sweats, fevers, chills. He does have a chronic daily cough that is productive with white to yellow phlegm. He's had this for many years. He has no desire to quit smoking.  He has no other pulmonary concerns. Cough does not bother him. He has a history of HLD. No history of pulmonary disease or family history of lung disease. Not on any inhalers. No oxygen use. No episodes of recurrent bronchitis or lung infections. No hospitalizations.  He lives alone. He is widowed. He has adult children. No illicit drug use. Quit drinking alcohol in February 2024. 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 No significant occupational exposures.   Allergies  Allergen Reactions   Penicillins     As child      Immunization History  Administered Date(s) Administered   Fluad Quad(high Dose 65+) 06/13/2019   Influenza Split 10/10/2013   Influenza, High Dose Seasonal PF 07/13/2017, 08/01/2018, 08/01/2018, 07/05/2023   Influenza,inj,Quad PF,6+ Mos 08/21/2014   Influenza-Unspecified 06/24/2016, 06/30/2021   PFIZER Comirnaty(Gray Top)Covid-19 Tri-Sucrose Vaccine 01/18/2020, 02/12/2020, 09/19/2020   PNEUMOCOCCAL CONJUGATE-20 08/17/2023   Pneumococcal Conjugate-13 06/24/2016    Past Medical History:  Diagnosis Date   Abnormal CT of the chest 06/08/2018   Allergy    mild   Arthritis    Atherosclerosis of aorta (HCC) 06/08/2018   Cancer (HCC) 2022   skin cancer   Cataract    removed right eye   Coronary artery disease involving native heart without angina pectoris 06/08/2018   Diabetes mellitus without complication (HCC)    pre diabetes   Erectile dysfunction 06/08/2018   Eye disease 02/18/2017   GERD (gastroesophageal reflux disease)    occ   HDL deficiency 02/19/2017   Hyperlipidemia    Impacted cerumen of right ear 02/18/2017   Leukocytosis 02/19/2017   Mixed dyslipidemia 02/19/2017   Overweight 02/18/2017   Smoking    Tobacco use 02/18/2017    Tobacco History: Social History   Tobacco Use  Smoking Status Every Day   Current packs/day: 1.00   Average packs/day: 1 pack/day for 51.0 years (51.0 ttl pk-yrs)   Types: Cigarettes  Smokeless Tobacco Never   Ready to quit: Not Answered Counseling given: Not Answered   Outpatient Medications Prior to Visit  Medication Sig Dispense Refill  aspirin  EC 81 MG tablet Take 1 tablet (81 mg total) by mouth daily. 90 tablet 3   atorvastatin  (LIPITOR) 40 MG tablet TAKE 1 TABLET EVERY DAY 90 tablet 3   Coenzyme Q10 (CO Q-10) 100 MG CAPS Take by mouth daily.     metFORMIN  (GLUCOPHAGE ) 500 MG tablet TAKE 1 TABLET EVERY DAY WITH BREAKFAST 90 tablet 3   naproxen sodium (ALEVE) 220 MG tablet Take 220 mg by mouth. Takes PRN     No  facility-administered medications prior to visit.     Review of Systems:   Constitutional: No weight loss or gain, night sweats, fevers, chills, fatigue, or lassitude. HEENT: No headaches, difficulty swallowing, tooth/dental problems, or sore throat. No sneezing, itching, ear ache, nasal congestion, or post nasal drip CV:  No chest pain, orthopnea, PND, swelling in lower extremities, anasarca, dizziness, palpitations, syncope Resp: +chronic productive cough. No shortness of breath with exertion or at rest. No excess mucus or change in color of mucus. No hemoptysis. No wheezing.  No chest wall deformity GI:  No heartburn, indigestion, abdominal pain, nausea, vomiting, diarrhea, change in bowel habits, loss of appetite, bloody stools.  GU: No dysuria, change in color of urine, urgency or frequency.  No flank pain, no hematuria  Skin: No rash, lesions, ulcerations MSK:  No joint pain or swelling.   Neuro: No dizziness or lightheadedness.  Psych: No depression or anxiety. Mood stable.     Physical Exam:  BP 138/84 (BP Location: Right Arm, Patient Position: Sitting, Cuff Size: Large)   Pulse 65   Temp 97.8 F (36.6 C) (Oral)   Ht 5' 7 (1.702 m)   Wt 163 lb 9.6 oz (74.2 kg)   SpO2 98%   BMI 25.62 kg/m   GEN: Pleasant, interactive, well-kempt; in no acute distress HEENT:  Normocephalic and atraumatic. PERRLA. Sclera white. Nasal turbinates pink, moist and patent bilaterally. No rhinorrhea present. Oropharynx pink and moist, without exudate or edema. No lesions, ulcerations, or postnasal drip.  NECK:  Supple w/ fair ROM. No JVD present. Normal carotid impulses w/o bruits. Thyroid symmetrical with no goiter or nodules palpated. No lymphadenopathy.   CV: RRR, no m/r/g, no peripheral edema. Pulses intact, +2 bilaterally. No cyanosis, pallor or clubbing. PULMONARY:  Unlabored, regular breathing. Scattered rhonchi b/l lower lobes otherwise clear bilaterally A&P w/o wheezes/rales/rhonchi. No  accessory muscle use.  GI: BS present and normoactive. Soft, non-tender to palpation. No organomegaly or masses detected.  MSK: No erythema, warmth or tenderness. Cap refil <2 sec all extrem. No deformities or joint swelling noted.  Neuro: A/Ox3. No focal deficits noted.   Skin: Warm, no lesions or rashe Psych: Normal affect and behavior. Judgement and thought content appropriate.     Lab Results:  CBC    Component Value Date/Time   WBC 9.5 06/22/2023 0956   WBC 13.2 (H) 02/18/2017 0939   RBC 5.58 06/22/2023 0956   RBC 5.25 02/18/2017 0939   HGB 15.6 06/22/2023 0956   HCT 46.7 06/22/2023 0956   PLT 276 06/22/2023 0956   MCV 84 06/22/2023 0956   MCH 28.0 06/22/2023 0956   MCH 30.7 02/18/2017 0939   MCHC 33.4 06/22/2023 0956   MCHC 35.3 02/18/2017 0939   RDW 14.2 06/22/2023 0956   LYMPHSABS 4.7 (H) 05/28/2021 1111   EOSABS 0.1 05/28/2021 1111   BASOSABS 0.1 05/28/2021 1111    BMET    Component Value Date/Time   NA 139 06/22/2023 0956   K 4.5 06/22/2023 0956  CL 101 06/22/2023 0956   CO2 25 06/22/2023 0956   GLUCOSE 73 06/22/2023 0956   GLUCOSE 70 02/18/2017 0939   BUN 14 06/22/2023 0956   CREATININE 0.94 06/22/2023 0956   CREATININE 0.93 02/18/2017 0939   CALCIUM  9.5 06/22/2023 0956   GFRNONAA 85 07/19/2019 1056   GFRAA 98 07/19/2019 1056    BNP No results found for: BNP   Imaging:  No results found.  Administration History     None           No data to display          No results found for: NITRICOXIDE      Assessment & Plan:   Nodule of right lung New 6.5 mm right apical lung nodule, Lung RADS 4A, visualized on lung cancer screening CT chest 07/13/2023. Discussed possibility of malignancy given smoking history and appearance. We also reviewed potential next steps, dependent on appearance of the nodule, including PET imaging, bronchoscopy, referral for surgical resection with thoracic surgery, or continued surveillance. Plan for  repeat Super D CT chest in next 1-2 weeks for further evaluation. He is hesitant to have any procedures at this time but is open to further discussions, pending results. He has no desire to quit smoking at this time. We did discuss that if this appears to be a cancerous lesion and surgical resection is an option, he would need to quit smoking. He will also need PFTs to assess lung function and DLCO, which we will order today. Will review results/discuss next steps with Dr. Brenna at follow up.  Patient Instructions  Your lung nodule is consider suspicious and will need a repeat CT chest for further evaluation. Someone should contact you in a few days to schedule this in 1-2 weeks. Depending on the nodule size and growth, we will decide on next steps which may include a bronchoscopy, referral to thoracic surgery for surgical options, or continuing to monitor with CTs.  We will also get lung function testing on you - someone should contact you for this  Follow up with Dr. Brenna to review CT scan results and decide next steps, or sooner, if needed    Cigarette smoker The patient's current tobacco use: 1 ppd The patient was advised to quit and impact of smoking on their health.  I assessed the patient's willingness to attempt to quit.  I provided methods and skills for cessation. Resources to help quit smoking were provided. A smoking cessation quit date was set: n/a Follow-up was arranged in our clinic.  The amount of time spent counseling patient was 4 mins    Chronic cough Emphysema and evidence of chronic bronchitis on CT imaging. Chronic cough present, which he reports at non-bothersome. Smoking cessation advised. No indication for scheduled bronchodilator regimen at this time. Remain up to date on vaccines. Avoid sick exposures. Aware to notify if he develops new/worsening respiratory symptoms.    Advised if symptoms do not improve or worsen, to please contact office for sooner follow up or  seek emergency care.   I spent 45 minutes of dedicated to the care of this patient on the date of this encounter to include pre-visit review of records, face-to-face time with the patient discussing conditions above, post visit ordering of testing, clinical documentation with the electronic health record, making appropriate referrals as documented, and communicating necessary findings to members of the patients care team.  Comer LULLA Rouleau, NP 10/18/2023  Pt aware and understands NP's role.

## 2023-10-18 NOTE — Assessment & Plan Note (Addendum)
 The patient's current tobacco use: 1 ppd The patient was advised to quit and impact of smoking on their health.  I assessed the patient's willingness to attempt to quit.  I provided methods and skills for cessation. Resources to help quit smoking were provided. A smoking cessation quit date was set: n/a Follow-up was arranged in our clinic.  The amount of time spent counseling patient was 4 mins

## 2023-10-19 ENCOUNTER — Ambulatory Visit (INDEPENDENT_AMBULATORY_CARE_PROVIDER_SITE_OTHER): Payer: Medicare HMO | Admitting: Pulmonary Disease

## 2023-10-19 DIAGNOSIS — J432 Centrilobular emphysema: Secondary | ICD-10-CM

## 2023-10-19 DIAGNOSIS — R911 Solitary pulmonary nodule: Secondary | ICD-10-CM | POA: Diagnosis not present

## 2023-10-19 DIAGNOSIS — R053 Chronic cough: Secondary | ICD-10-CM

## 2023-10-19 DIAGNOSIS — F17210 Nicotine dependence, cigarettes, uncomplicated: Secondary | ICD-10-CM

## 2023-10-19 LAB — PULMONARY FUNCTION TEST
DL/VA % pred: 72 %
DL/VA: 2.94 ml/min/mmHg/L
DLCO unc % pred: 60 %
DLCO unc: 14.13 ml/min/mmHg
FEF 25-75 Post: 2.53 L/s
FEF 25-75 Pre: 2.63 L/s
FEF2575-%Change-Post: -3 %
FEF2575-%Pred-Post: 123 %
FEF2575-%Pred-Pre: 128 %
FEV1-%Change-Post: -3 %
FEV1-%Pred-Post: 93 %
FEV1-%Pred-Pre: 96 %
FEV1-Post: 2.59 L
FEV1-Pre: 2.68 L
FEV1FVC-%Change-Post: -1 %
FEV1FVC-%Pred-Pre: 111 %
FEV6-%Change-Post: -3 %
FEV6-%Pred-Post: 89 %
FEV6-%Pred-Pre: 92 %
FEV6-Post: 3.19 L
FEV6-Pre: 3.3 L
FEV6FVC-%Change-Post: 0 %
FEV6FVC-%Pred-Post: 106 %
FEV6FVC-%Pred-Pre: 106 %
FVC-%Change-Post: -2 %
FVC-%Pred-Post: 84 %
FVC-%Pred-Pre: 86 %
FVC-Post: 3.22 L
FVC-Pre: 3.3 L
Post FEV1/FVC ratio: 80 %
Post FEV6/FVC ratio: 100 %
Pre FEV1/FVC ratio: 81 %
Pre FEV6/FVC Ratio: 100 %
RV % pred: 79 %
RV: 1.85 L
TLC % pred: 83 %
TLC: 5.34 L

## 2023-10-19 NOTE — Patient Instructions (Signed)
 Full PFT performed today.

## 2023-10-19 NOTE — Progress Notes (Signed)
 Full PFT performed today.

## 2023-10-25 ENCOUNTER — Institutional Professional Consult (permissible substitution): Payer: Medicare HMO | Admitting: Internal Medicine

## 2023-10-27 ENCOUNTER — Telehealth: Payer: Self-pay | Admitting: Nurse Practitioner

## 2023-10-27 NOTE — Telephone Encounter (Signed)
 Patient would like a call from [facility] [phone]

## 2023-10-28 ENCOUNTER — Ambulatory Visit
Admission: RE | Admit: 2023-10-28 | Discharge: 2023-10-28 | Disposition: A | Discharge status: Home / Self Care | Payer: Medicare HMO | Source: Ambulatory Visit | Attending: Nurse Practitioner | Admitting: Nurse Practitioner

## 2023-10-28 DIAGNOSIS — R911 Solitary pulmonary nodule: Secondary | ICD-10-CM | POA: Diagnosis not present

## 2023-10-28 DIAGNOSIS — R599 Enlarged lymph nodes, unspecified: Secondary | ICD-10-CM | POA: Diagnosis not present

## 2023-10-28 DIAGNOSIS — I2510 Atherosclerotic heart disease of native coronary artery without angina pectoris: Secondary | ICD-10-CM | POA: Diagnosis not present

## 2023-10-28 DIAGNOSIS — J439 Emphysema, unspecified: Secondary | ICD-10-CM | POA: Diagnosis not present

## 2023-11-08 NOTE — Telephone Encounter (Signed)
I called the pt and there was no answer- LMTCB. ?

## 2023-11-12 ENCOUNTER — Telehealth: Payer: Self-pay | Admitting: Nurse Practitioner

## 2023-11-15 DIAGNOSIS — H35352 Cystoid macular degeneration, left eye: Secondary | ICD-10-CM | POA: Diagnosis not present

## 2023-11-15 DIAGNOSIS — H353121 Nonexudative age-related macular degeneration, left eye, early dry stage: Secondary | ICD-10-CM | POA: Diagnosis not present

## 2023-11-15 DIAGNOSIS — H43822 Vitreomacular adhesion, left eye: Secondary | ICD-10-CM | POA: Diagnosis not present

## 2023-11-15 DIAGNOSIS — H34811 Central retinal vein occlusion, right eye, with macular edema: Secondary | ICD-10-CM | POA: Diagnosis not present

## 2023-11-15 DIAGNOSIS — H353114 Nonexudative age-related macular degeneration, right eye, advanced atrophic with subfoveal involvement: Secondary | ICD-10-CM | POA: Diagnosis not present

## 2023-11-15 DIAGNOSIS — H35371 Puckering of macula, right eye: Secondary | ICD-10-CM | POA: Diagnosis not present

## 2023-11-15 DIAGNOSIS — H2512 Age-related nuclear cataract, left eye: Secondary | ICD-10-CM | POA: Diagnosis not present

## 2023-11-15 NOTE — Telephone Encounter (Signed)
Patient is returning phone call. Patient phone number is [phone].

## 2023-11-15 NOTE — Telephone Encounter (Signed)
 Lm for patient.

## 2023-11-16 NOTE — Telephone Encounter (Signed)
 Lm for patient.

## 2023-11-16 NOTE — Telephone Encounter (Signed)
Patient is returning phone call. Patient phone number is [phone]. Available until 3:00 pm.

## 2023-11-16 NOTE — Telephone Encounter (Signed)
Katie, please contact patient after 3:00. Thanks

## 2023-11-19 ENCOUNTER — Institutional Professional Consult (permissible substitution): Payer: Medicare HMO | Admitting: Pulmonary Disease

## 2023-11-19 NOTE — Telephone Encounter (Signed)
 Attempted to contact pt x 2.  His nodule has enlarged and is concerning for a lung cancer. It is still on the smaller side at 7 mm so sometimes this is not picked up on PET imaging. When I spoke to the patient last, he was pretty adamant that he would not want to undergo invasive testing (bronchoscopy) if he did not absolutely have to. If he wants to be very conservative, we could obtain short interval 3 month follow up imaging with a PET scan but he would need to be aware that if this is a cancer, it could grow in the interim. If Dr. Shelah feels like he would be an appropriate bronchoscopy candidate, then we could move forward with this to sample the nodule and find out if this is indeed a lung cancer then decide next steps. I'll reach out to Dr. Shelah prior to his follow up and they can discuss then. Thanks.

## 2023-11-22 NOTE — Telephone Encounter (Signed)
 Spoke to patient and below message/recommendation. He stated that he has an upcoming appt on 2/26 and he will discuss results further at that time.  Routing to Dr.  Baldwin Levee and Alston Jerry to make aware.

## 2023-11-22 NOTE — Telephone Encounter (Signed)
 Thank you :)

## 2023-12-08 ENCOUNTER — Telehealth: Payer: Self-pay | Admitting: *Deleted

## 2023-12-08 ENCOUNTER — Encounter: Payer: Self-pay | Admitting: *Deleted

## 2023-12-08 ENCOUNTER — Institutional Professional Consult (permissible substitution): Payer: Medicare HMO | Admitting: Emergency Medicine

## 2023-12-08 NOTE — Telephone Encounter (Signed)
 ATC patient x1.  Left detailed message per [facility] requesting he return our call to get him rescheduled for a consult with Dr. Delton Coombes.  When he returns call, please reschedule.

## 2023-12-27 DIAGNOSIS — H353114 Nonexudative age-related macular degeneration, right eye, advanced atrophic with subfoveal involvement: Secondary | ICD-10-CM | POA: Diagnosis not present

## 2023-12-27 DIAGNOSIS — H2512 Age-related nuclear cataract, left eye: Secondary | ICD-10-CM | POA: Diagnosis not present

## 2023-12-27 DIAGNOSIS — H43822 Vitreomacular adhesion, left eye: Secondary | ICD-10-CM | POA: Diagnosis not present

## 2023-12-27 DIAGNOSIS — H35371 Puckering of macula, right eye: Secondary | ICD-10-CM | POA: Diagnosis not present

## 2023-12-27 DIAGNOSIS — H35352 Cystoid macular degeneration, left eye: Secondary | ICD-10-CM | POA: Diagnosis not present

## 2023-12-27 DIAGNOSIS — H353121 Nonexudative age-related macular degeneration, left eye, early dry stage: Secondary | ICD-10-CM | POA: Diagnosis not present

## 2023-12-27 DIAGNOSIS — H34811 Central retinal vein occlusion, right eye, with macular edema: Secondary | ICD-10-CM | POA: Diagnosis not present

## 2024-01-20 ENCOUNTER — Ambulatory Visit: Payer: Medicare HMO | Admitting: Emergency Medicine

## 2024-01-20 ENCOUNTER — Encounter: Payer: Self-pay | Admitting: Emergency Medicine

## 2024-01-20 VITALS — BP 170/90 | HR 75 | Ht 68.0 in | Wt 165.8 lb

## 2024-01-20 DIAGNOSIS — R590 Localized enlarged lymph nodes: Secondary | ICD-10-CM | POA: Diagnosis not present

## 2024-01-20 DIAGNOSIS — F17210 Nicotine dependence, cigarettes, uncomplicated: Secondary | ICD-10-CM

## 2024-01-20 DIAGNOSIS — R911 Solitary pulmonary nodule: Secondary | ICD-10-CM | POA: Diagnosis not present

## 2024-01-20 NOTE — Patient Instructions (Signed)
 VISIT SUMMARY:  You came in today for a follow-up on a pulmonary nodule that was found during your lung cancer screening. The nodule has slightly increased in size and density since your last scan. We discussed the potential for early lung cancer and the next steps for further evaluation.  YOUR PLAN:  -RIGHT UPPER LOBE PULMONARY NODULE: A pulmonary nodule is a small, round growth in the lung. Your nodule has increased in size and density, which raises concern for early lung cancer, especially given your smoking history. We will order a PET CT scan to assess the activity of the nodule and nearby lymph nodes. After the scan, we will meet again to discuss the results and plan the next steps.  -LYMPHADENOPATHY: Lymphadenopathy refers to the enlargement of lymph nodes. Small lymph nodes were found near your lungs, which will be evaluated with the PET CT scan to check for any abnormal activity.  -TOBACCO USE DISORDER: Tobacco use disorder is a condition where a person is dependent on smoking. We discussed how quitting smoking could improve your overall health and surgical options if needed. We will continue to discuss smoking cessation options in future visits.  INSTRUCTIONS:  Please schedule a PET CT scan as soon as possible. After the scan, we will have a follow-up appointment to review the results and discuss the next steps.

## 2024-01-20 NOTE — Progress Notes (Signed)
 Subjective:    Patient ID: Brent Espinoza, male    DOB: December 27, 1950, 73 y.o.   MRN: [id number]  HPI  Brent Espinoza "Brent Espinoza" is a 73 year old male who presents for follow-up of a pulmonary nodule.  He has been part of a lung cancer screening program for several years. An October CT scan identified a new 6.5 mm right apical nodule. A follow-up CT scan on October 28, 2023, showed the nodule increased to 7 mm with increased density. Small mediastinal and hilar nodes were also noted, including an 8 mm node adjacent to the right main stem and an 8 mm subcarinal node.  He experiences no significant breathing difficulties and can perform daily activities without limitation. Pulmonary function testing on October 19, 2023, showed normal results despite his smoking history.  He has a 51 pack-year smoking history and continues to smoke. His past medical history includes coronary artery disease, diabetes, GERD, hyperlipidemia, and skin cancer. There is no family history of lung cancer.  Brock calculation for cancer risk approximately 11%   RADIOLOGY Chest CT: 7 mm spiculated solid right upper lobe pulmonary nodule, increased density, 8 mm node adjacent to the right main stem, 8 mm subcarinal node (10/28/2023)   Pulmonary function testing 10/19/2023 reviewed by me, shows normal airflows without a bronchodilator response, normal lung volumes, decreased diffusion capacity that does not fully correct when adjusted for alveolar volume.  Review of Systems As per HPI  Past Medical History:  Diagnosis Date   Abnormal CT of the chest 06/08/2018   Allergy    mild   Arthritis    Atherosclerosis of aorta (HCC) 06/08/2018   Cancer (HCC) 2022   skin cancer   Cataract    removed right eye   Coronary artery disease involving native heart without angina pectoris 06/08/2018   Diabetes mellitus without complication (HCC)    "pre diabetes"   Erectile dysfunction 06/08/2018   Eye disease 02/18/2017    GERD (gastroesophageal reflux disease)    occ   HDL deficiency 02/19/2017   Hyperlipidemia    Impacted cerumen of right ear 02/18/2017   Leukocytosis 02/19/2017   Mixed dyslipidemia 02/19/2017   Overweight 02/18/2017   Smoking    Tobacco use 02/18/2017     Family History  Problem Relation Age of Onset   Other Mother        eye disease, died of old age   Other Father        health history unknown   Cancer Neg Hx    Diabetes Neg Hx    Heart disease Neg Hx    Stroke Neg Hx    Hyperlipidemia Neg Hx    Hypertension Neg Hx    Colon cancer Neg Hx    Colon polyps Neg Hx    Rectal cancer Neg Hx    Stomach cancer Neg Hx      Social History   Socioeconomic History   Marital status: Divorced    Spouse name: Not on file   Number of children: Not on file   Years of education: Not on file   Highest education level: Not on file  Occupational History   Not on file  Tobacco Use   Smoking status: Every Day    Current packs/day: 1.00    Average packs/day: 1 pack/day for 51.0 years (51.0 ttl pk-yrs)    Types: Cigarettes   Smokeless tobacco: Never   Tobacco comments:    1pk daily 01/20/24  Vaping Use  Vaping status: Never Used  Substance and Sexual Activity   Alcohol use: Yes    Comment: occ   Drug use: No   Sexual activity: Not on file  Other Topics Concern   Not on file  Social History Narrative   Alone.   Retired as of 2018.   Was at [employer].   Exercise - yard work, Programmer, applications around, walks.  Eats relatively healthy.   Has 2 children, 1 in [place], 1 in [place], 4 grandchildren.  Talks with his child in [place] every week.  Collects coins, other hobbies.  06/2023   Social Drivers of Health   Financial Resource Strain: Low Risk  (06/22/2023)   Overall Financial Resource Strain (CARDIA)    Difficulty of Paying Living Expenses: Not very hard  Food Insecurity: No Food Insecurity (06/22/2023)   Hunger Vital Sign    Worried About Running Out of Food in the Last Year: Never  true    Ran Out of Food in the Last Year: Never true  Transportation Needs: No Transportation Needs (06/22/2023)   PRAPARE - Administrator, Civil Service (Medical): No    Lack of Transportation (Non-Medical): No  Physical Activity: Insufficiently Active (06/22/2023)   Exercise Vital Sign    Days of Exercise per Week: 2 days    Minutes of Exercise per Session: 20 min  Stress: No Stress Concern Present (05/14/2023)   [employer] of Occupational Health - Occupational Stress Questionnaire    Feeling of Stress : Not at all  Social Connections: Unknown (06/22/2023)   Social Connection and Isolation Panel [NHANES]    Frequency of Communication with Friends and Family: Twice a week    Frequency of Social Gatherings with Friends and Family: Not on file    Attends Religious Services: 1 to 4 times per year    Active Member of [employer] or Organizations: No    Attends Banker Meetings: Never    Marital Status: Divorced  Catering manager Violence: Not At Risk (06/22/2023)   Humiliation, Afraid, Rape, and Kick questionnaire    Fear of Current or Ex-Partner: No    Emotionally Abused: No    Physically Abused: No    Sexually Abused: No     Allergies  Allergen Reactions   Penicillins     As child      Outpatient Medications Prior to Visit  Medication Sig Dispense Refill   aspirin EC 81 MG tablet Take 1 tablet (81 mg total) by mouth daily. 90 tablet 3   atorvastatin (LIPITOR) 40 MG tablet TAKE 1 TABLET EVERY DAY 90 tablet 3   Coenzyme Q10 (CO Q-10) 100 MG CAPS Take by mouth daily.     metFORMIN (GLUCOPHAGE) 500 MG tablet TAKE 1 TABLET EVERY DAY WITH BREAKFAST 90 tablet 3   naproxen sodium (ALEVE) 220 MG tablet Take 220 mg by mouth. Takes PRN     No facility-administered medications prior to visit.         Objective:   Physical Exam Vitals:   01/20/24 1025 01/20/24 1027  BP: (!) 180/84 (!) 170/90  Pulse: 75   SpO2: 95%   Weight: 165 lb 12.8 oz (75.2 kg)    Height: 5\' 8"  (1.727 m)    Gen: Pleasant, well-nourished, in no distress,  normal affect  ENT: No lesions,  mouth clear,  oropharynx clear, no postnasal drip  Neck: No JVD, no stridor  Lungs: No use of accessory muscles, no crackles or wheezing on normal respiration, no  wheeze on forced expiration  Cardiovascular: RRR, heart sounds normal, no murmur or gallops, no peripheral edema  Musculoskeletal: No deformities, no cyanosis or clubbing  Neuro: alert, awake, non focal  Skin: Warm, no lesions or rash      Assessment & Plan:   Nodule of right lung Right upper lobe pulmonary nodule 7 mm speculated solid nodule in right upper lobe, increased size and density. Moderate to high suspicion for early lung cancer due to tobacco use. Discussed early intervention benefits and non-invasive monitoring preference. Explained PET scan limitations and 11% lung cancer probability. - Order PET CT scan to assess metabolic activity of nodule and lymph nodes. - Schedule follow-up to review PET CT results and discuss management.   Mediastinal adenopathy Lymphadenopathy Small mediastinal and hilar lymph nodes present, monitored due to pulmonary nodule. PET scan to assess metabolic activity. - Evaluate lymph nodes with PET CT scan.   Cigarette smoker Tobacco use disorder Significant tobacco use history, active smoker, 51 pack-year history. Discussed smoking cessation impact on surgical candidacy and health. Not ready to quit. - Discuss smoking cessation options at future visits.  Time spent 44 minutes  Levy Pupa, MD, PhD 01/20/2024, 11:16 AM [facility] [phone] or if no answer before 7:00PM call [phone] For any issues after 7:00PM please call eLink [phone]

## 2024-01-20 NOTE — Assessment & Plan Note (Signed)
 Lymphadenopathy Small mediastinal and hilar lymph nodes present, monitored due to pulmonary nodule. PET scan to assess metabolic activity. - Evaluate lymph nodes with PET CT scan.

## 2024-01-20 NOTE — Assessment & Plan Note (Signed)
 Right upper lobe pulmonary nodule 7 mm speculated solid nodule in right upper lobe, increased size and density. Moderate to high suspicion for early lung cancer due to tobacco use. Discussed early intervention benefits and non-invasive monitoring preference. Explained PET scan limitations and 11% lung cancer probability. - Order PET CT scan to assess metabolic activity of nodule and lymph nodes. - Schedule follow-up to review PET CT results and discuss management.

## 2024-01-20 NOTE — Assessment & Plan Note (Signed)
 Tobacco use disorder Significant tobacco use history, active smoker, 51 pack-year history. Discussed smoking cessation impact on surgical candidacy and health. Not ready to quit. - Discuss smoking cessation options at future visits.

## 2024-02-02 ENCOUNTER — Ambulatory Visit (HOSPITAL_COMMUNITY)
Admission: RE | Admit: 2024-02-02 | Discharge: 2024-02-02 | Disposition: A | Discharge status: Home / Self Care | Source: Ambulatory Visit | Attending: Emergency Medicine | Admitting: Emergency Medicine

## 2024-02-02 ENCOUNTER — Encounter (HOSPITAL_COMMUNITY): Payer: Self-pay

## 2024-02-02 DIAGNOSIS — R918 Other nonspecific abnormal finding of lung field: Secondary | ICD-10-CM | POA: Diagnosis not present

## 2024-02-02 DIAGNOSIS — R590 Localized enlarged lymph nodes: Secondary | ICD-10-CM

## 2024-02-02 DIAGNOSIS — R911 Solitary pulmonary nodule: Secondary | ICD-10-CM

## 2024-02-02 DIAGNOSIS — R9089 Other abnormal findings on diagnostic imaging of central nervous system: Secondary | ICD-10-CM | POA: Diagnosis not present

## 2024-02-02 LAB — GLUCOSE, CAPILLARY: Glucose-Capillary: 108 mg/dL — ABNORMAL HIGH (ref 70–99)

## 2024-02-02 MED ORDER — FLUDEOXYGLUCOSE F - 18 (FDG) INJECTION
8.2500 | Freq: Once | INTRAVENOUS | Status: AC
Start: 2024-02-02 — End: 2024-02-02
  Administered 2024-02-02: 8.19 via INTRAVENOUS

## 2024-02-08 DIAGNOSIS — H524 Presbyopia: Secondary | ICD-10-CM | POA: Diagnosis not present

## 2024-02-08 DIAGNOSIS — E119 Type 2 diabetes mellitus without complications: Secondary | ICD-10-CM | POA: Diagnosis not present

## 2024-02-08 DIAGNOSIS — Z961 Presence of intraocular lens: Secondary | ICD-10-CM | POA: Diagnosis not present

## 2024-02-08 DIAGNOSIS — H2512 Age-related nuclear cataract, left eye: Secondary | ICD-10-CM | POA: Diagnosis not present

## 2024-02-14 DIAGNOSIS — H353121 Nonexudative age-related macular degeneration, left eye, early dry stage: Secondary | ICD-10-CM | POA: Diagnosis not present

## 2024-02-14 DIAGNOSIS — H34811 Central retinal vein occlusion, right eye, with macular edema: Secondary | ICD-10-CM | POA: Diagnosis not present

## 2024-02-14 DIAGNOSIS — H43822 Vitreomacular adhesion, left eye: Secondary | ICD-10-CM | POA: Diagnosis not present

## 2024-02-14 DIAGNOSIS — H2512 Age-related nuclear cataract, left eye: Secondary | ICD-10-CM | POA: Diagnosis not present

## 2024-02-14 DIAGNOSIS — H35352 Cystoid macular degeneration, left eye: Secondary | ICD-10-CM | POA: Diagnosis not present

## 2024-02-14 DIAGNOSIS — H353114 Nonexudative age-related macular degeneration, right eye, advanced atrophic with subfoveal involvement: Secondary | ICD-10-CM | POA: Diagnosis not present

## 2024-02-14 DIAGNOSIS — H35371 Puckering of macula, right eye: Secondary | ICD-10-CM | POA: Diagnosis not present

## 2024-02-23 ENCOUNTER — Encounter: Payer: Self-pay | Admitting: Emergency Medicine

## 2024-02-23 ENCOUNTER — Ambulatory Visit: Admitting: Emergency Medicine

## 2024-02-23 VITALS — BP 160/86 | HR 56 | Ht 68.0 in | Wt 162.8 lb

## 2024-02-23 DIAGNOSIS — F17210 Nicotine dependence, cigarettes, uncomplicated: Secondary | ICD-10-CM | POA: Diagnosis not present

## 2024-02-23 DIAGNOSIS — R911 Solitary pulmonary nodule: Secondary | ICD-10-CM

## 2024-02-23 NOTE — Progress Notes (Signed)
 Subjective:    Patient ID: Brent Espinoza, male    DOB: 11/09/1950, 73 y.o.   MRN: [id number]  HPI  ROV 02/23/2024 --follow-up visit for 73 year old man with history of tobacco use, CAD, GERD, hyperlipidemia.  He was found to have a 7 mm spiculated right upper lobe nodule with some associated lymphadenopathy on lung cancer screening CT.  We arranged for a PET/CT to further evaluate as below He overall feels well. He has trepidation about surgery or bronchoscopy at this time. .   Super D CT chest 02/02/24 reviewed by me showed centrilobular emphysema, some dependent atelectasis.  There is a spiculated 8 x 6 mm right upper lobe pulmonary nodule, right precarinal 6 mm nodule, subcarinal nodule, both stable  PET scan 02/02/24 reviewed by me shows some mild SUV uptake in the right upper lobe nodule.  No uptake in his mediastinal nodes  Review of Systems As per HPI  Past Medical History:  Diagnosis Date   Abnormal CT of the chest 06/08/2018   Allergy    mild   Arthritis    Atherosclerosis of aorta (HCC) 06/08/2018   Cancer (HCC) 2022   skin cancer   Cataract    removed right eye   Coronary artery disease involving native heart without angina pectoris 06/08/2018   Diabetes mellitus without complication (HCC)    "pre diabetes"   Erectile dysfunction 06/08/2018   Eye disease 02/18/2017   GERD (gastroesophageal reflux disease)    occ   HDL deficiency 02/19/2017   Hyperlipidemia    Impacted cerumen of right ear 02/18/2017   Leukocytosis 02/19/2017   Mixed dyslipidemia 02/19/2017   Overweight 02/18/2017   Smoking    Tobacco use 02/18/2017     Family History  Problem Relation Age of Onset   Other Mother        eye disease, died of old age   Other Father        health history unknown   Cancer Neg Hx    Diabetes Neg Hx    Heart disease Neg Hx    Stroke Neg Hx    Hyperlipidemia Neg Hx    Hypertension Neg Hx    Colon cancer Neg Hx    Colon polyps Neg Hx    Rectal cancer Neg  Hx    Stomach cancer Neg Hx      Social History   Socioeconomic History   Marital status: Divorced    Spouse name: Not on file   Number of children: Not on file   Years of education: Not on file   Highest education level: Not on file  Occupational History   Not on file  Tobacco Use   Smoking status: Every Day    Current packs/day: 1.00    Average packs/day: 1 pack/day for 51.0 years (51.0 ttl pk-yrs)    Types: Cigarettes   Smokeless tobacco: Never   Tobacco comments:    1pk daily 02/23/24  Vaping Use   Vaping status: Never Used  Substance and Sexual Activity   Alcohol use: Yes    Comment: occ   Drug use: No   Sexual activity: Not on file  Other Topics Concern   Not on file  Social History Narrative   Alone.   Retired as of 2018.   Was at [employer].   Exercise - yard work, Programmer, applications around, walks.  Eats relatively healthy.   Has 2 children, 1 in [place], 1 in [place], 4 grandchildren.  Talks with his child  in [place] every week.  Collects coins, other hobbies.  06/2023   Social Drivers of Health   Financial Resource Strain: Low Risk  (06/22/2023)   Overall Financial Resource Strain (CARDIA)    Difficulty of Paying Living Expenses: Not very hard  Food Insecurity: No Food Insecurity (06/22/2023)   Hunger Vital Sign    Worried About Running Out of Food in the Last Year: Never true    Ran Out of Food in the Last Year: Never true  Transportation Needs: No Transportation Needs (06/22/2023)   PRAPARE - Administrator, Civil Service (Medical): No    Lack of Transportation (Non-Medical): No  Physical Activity: Insufficiently Active (06/22/2023)   Exercise Vital Sign    Days of Exercise per Week: 2 days    Minutes of Exercise per Session: 20 min  Stress: No Stress Concern Present (05/14/2023)   [employer] of Occupational Health - Occupational Stress Questionnaire    Feeling of Stress : Not at all  Social Connections: Unknown (06/22/2023)   Social Connection and  Isolation Panel [NHANES]    Frequency of Communication with Friends and Family: Twice a week    Frequency of Social Gatherings with Friends and Family: Not on file    Attends Religious Services: 1 to 4 times per year    Active Member of [employer] or Organizations: No    Attends Banker Meetings: Never    Marital Status: Divorced  Catering manager Violence: Not At Risk (06/22/2023)   Humiliation, Afraid, Rape, and Kick questionnaire    Fear of Current or Ex-Partner: No    Emotionally Abused: No    Physically Abused: No    Sexually Abused: No     Allergies  Allergen Reactions   Penicillins     As child      Outpatient Medications Prior to Visit  Medication Sig Dispense Refill   aspirin  EC 81 MG tablet Take 1 tablet (81 mg total) by mouth daily. 90 tablet 3   atorvastatin  (LIPITOR) 40 MG tablet TAKE 1 TABLET EVERY DAY 90 tablet 3   Coenzyme Q10 (CO Q-10) 100 MG CAPS Take by mouth daily.     metFORMIN  (GLUCOPHAGE ) 500 MG tablet TAKE 1 TABLET EVERY DAY WITH BREAKFAST 90 tablet 3   naproxen sodium (ALEVE) 220 MG tablet Take 220 mg by mouth. Takes PRN     No facility-administered medications prior to visit.         Objective:   Physical Exam Vitals:   02/23/24 1317 02/23/24 1319  BP: (!) 172/96 (!) 160/86  Pulse: (!) 56   SpO2: 96%   Weight: 162 lb 12.8 oz (73.8 kg)   Height: 5\' 8"  (1.727 m)     Gen: Pleasant, well-nourished, in no distress,  normal affect  ENT: No lesions,  mouth clear,  oropharynx clear, no postnasal drip  Neck: No JVD, no stridor  Lungs: No use of accessory muscles, no crackles or wheezing on normal respiration, no wheeze on forced expiration  Cardiovascular: RRR, heart sounds normal, no murmur or gallops, no peripheral edema  Musculoskeletal: No deformities, no cyanosis or clubbing  Neuro: alert, awake, non focal  Skin: Warm, no lesions or rash      Assessment & Plan:   Nodule of right lung We reviewed his CT scan and PET scan  today.  The CT is unchanged in size but has suspicious characteristics and now there is hypermetabolism on the PET scan.  The nodes were unchanged and  were not hypermetabolic.  I recommended that we either refer him to thoracic surgery or perform bronchoscopy to better characterize the nodule.  After talking about this he wants to be more conservative, plan to repeat the CT chest and let interval stability help dictate whether we move towards biopsy.  We will plan to repeat his CT scan of the chest in 3 months and then follow-up to review.  Time spent 31 minutes  Racheal Buddle, MD, PhD 02/23/2024, 1:53 PM [facility] [phone] or if no answer before 7:00PM call [phone] For any issues after 7:00PM please call eLink [phone]

## 2024-02-23 NOTE — Assessment & Plan Note (Signed)
 We reviewed his CT scan and PET scan today.  The CT is unchanged in size but has suspicious characteristics and now there is hypermetabolism on the PET scan.  The nodes were unchanged and were not hypermetabolic.  I recommended that we either refer him to thoracic surgery or perform bronchoscopy to better characterize the nodule.  After talking about this he wants to be more conservative, plan to repeat the CT chest and let interval stability help dictate whether we move towards biopsy.  We will plan to repeat his CT scan of the chest in 3 months and then follow-up to review.

## 2024-02-23 NOTE — Patient Instructions (Signed)
 We reviewed your CT scan and PET scan today.  Your pulmonary nodule is stable in size but does have some metabolic activity on the PET scan. We talked today about the pros and cons of possible referral to thoracic surgery, bronchoscopy for nodule biopsy, following with serial CT scans.  For now we have decided to follow with a repeat CT chest.  This will be done in July 2025. Follow Dr. Baldwin Levee in July after your CT chest so we can review those results together.

## 2024-02-24 ENCOUNTER — Telehealth: Payer: Self-pay

## 2024-02-24 NOTE — Telephone Encounter (Signed)
 Spoke with [place]. Pt calling to schedule ov. Pt has been scheduled & is aware. Nothing further needed.

## 2024-02-24 NOTE — Telephone Encounter (Signed)
 Copied from CRM [id number]. Topic: Clinical - Medical Advice >> Feb 24, 2024  9:19 AM Isabell A wrote: Reason for CRM: Patient is requesting to speak with Dovie Gell - does not want to  state what it is in regard to.

## 2024-03-07 ENCOUNTER — Other Ambulatory Visit

## 2024-03-15 DIAGNOSIS — R69 Illness, unspecified: Secondary | ICD-10-CM | POA: Diagnosis not present

## 2024-04-10 ENCOUNTER — Ambulatory Visit: Payer: Self-pay | Admitting: Medical

## 2024-04-10 DIAGNOSIS — H35371 Puckering of macula, right eye: Secondary | ICD-10-CM | POA: Diagnosis not present

## 2024-04-10 DIAGNOSIS — H353121 Nonexudative age-related macular degeneration, left eye, early dry stage: Secondary | ICD-10-CM | POA: Diagnosis not present

## 2024-04-10 DIAGNOSIS — H35352 Cystoid macular degeneration, left eye: Secondary | ICD-10-CM | POA: Diagnosis not present

## 2024-04-10 DIAGNOSIS — H2512 Age-related nuclear cataract, left eye: Secondary | ICD-10-CM | POA: Diagnosis not present

## 2024-04-10 DIAGNOSIS — H353114 Nonexudative age-related macular degeneration, right eye, advanced atrophic with subfoveal involvement: Secondary | ICD-10-CM | POA: Diagnosis not present

## 2024-04-10 DIAGNOSIS — H34811 Central retinal vein occlusion, right eye, with macular edema: Secondary | ICD-10-CM | POA: Diagnosis not present

## 2024-04-10 DIAGNOSIS — H43822 Vitreomacular adhesion, left eye: Secondary | ICD-10-CM | POA: Diagnosis not present

## 2024-04-10 NOTE — Progress Notes (Signed)
 Abstract eye result

## 2024-04-25 ENCOUNTER — Ambulatory Visit
Admission: RE | Admit: 2024-04-25 | Discharge: 2024-04-25 | Disposition: A | Discharge status: Home / Self Care | Source: Ambulatory Visit | Attending: Emergency Medicine | Admitting: Emergency Medicine

## 2024-04-25 DIAGNOSIS — R911 Solitary pulmonary nodule: Secondary | ICD-10-CM

## 2024-04-25 DIAGNOSIS — J439 Emphysema, unspecified: Secondary | ICD-10-CM | POA: Diagnosis not present

## 2024-05-03 ENCOUNTER — Ambulatory Visit: Payer: Self-pay | Admitting: Emergency Medicine

## 2024-05-08 NOTE — Progress Notes (Signed)
 Tried calling the patient no answer- left vm.  Sending Mychart message

## 2024-05-09 DIAGNOSIS — D225 Melanocytic nevi of trunk: Secondary | ICD-10-CM | POA: Diagnosis not present

## 2024-05-09 DIAGNOSIS — L570 Actinic keratosis: Secondary | ICD-10-CM | POA: Diagnosis not present

## 2024-05-09 DIAGNOSIS — X32XXXD Exposure to sunlight, subsequent encounter: Secondary | ICD-10-CM | POA: Diagnosis not present

## 2024-05-23 ENCOUNTER — Ambulatory Visit: Payer: Medicare HMO

## 2024-05-23 DIAGNOSIS — Z0000 Encounter for general adult medical examination without abnormal findings: Secondary | ICD-10-CM | POA: Diagnosis not present

## 2024-05-23 NOTE — Patient Instructions (Addendum)
 Mr. Brent Espinoza , Thank you for taking time out of your busy schedule to complete your Annual Wellness Visit with me. I enjoyed our conversation and look forward to speaking with you again next year. I, as well as your care team,  appreciate your ongoing commitment to your health goals. Please review the following plan we discussed and let me know if I can assist you in the future. Your Game plan/ To Do List    Referrals: If you haven't heard from the office you've been referred to, please reach out to them at the phone provided.   Follow up Visits: We will see or speak with you next year for your Next Medicare AWV with our clinical staff Have you seen your provider in the last 6 months (3 months if uncontrolled diabetes)? No  Clinician Recommendations:  Aim for 30 minutes of exercise or brisk walking, 6-8 glasses of water, and 5 servings of fruits and vegetables each day.       This is a list of the screenings recommended for you:  Health Maintenance  Topic Date Due   COVID-19 Vaccine (5 - 2024-25 season) 01/02/2024   Flu Shot  05/12/2024   Screening for Lung Cancer  04/25/2025   Medicare Annual Wellness Visit  05/23/2025   Colon Cancer Screening  07/30/2026   Hepatitis C Screening  Completed   Hepatitis B Vaccine  Aged Out   HPV Vaccine  Aged Out   Meningitis B Vaccine  Aged Out   DTaP/Tdap/Td vaccine  Discontinued   Pneumococcal Vaccine for age over 50  Discontinued   Zoster (Shingles) Vaccine  Discontinued    Advanced directives: (ACP Link)Information on Advanced Care Planning can be found at [place]  Secretary of [facility] Advance Health Care Directives Advance Health Care Directives. [email or web address]  Advance Care Planning is important because it:  [x]  Makes sure you receive the medical care that is consistent with your values, goals, and preferences  [x]  It provides guidance to your family and loved ones and reduces their decisional burden about whether or not they are making the  right decisions based on your wishes.  Follow the link provided in your after visit summary or read over the paperwork we have mailed to you to help you started getting your Advance Directives in place. If you need assistance in completing these, please reach out to us  so that we can help you!  See attachments for Preventive Care and Fall Prevention Tips.

## 2024-05-23 NOTE — Progress Notes (Signed)
 Subjective:   Brent Espinoza is a 73 y.o. who presents for a Medicare Wellness preventive visit.  As a reminder, Annual Wellness Visits don't include a physical exam, and some assessments may be limited, especially if this visit is performed virtually. We may recommend an in-person follow-up visit with your provider if needed.  Visit Complete: Virtual I connected with  Brent Espinoza on 05/23/24 by a audio enabled telemedicine application and verified that I am speaking with the correct person using two identifiers.  Patient Location: Home  Provider Location: Office/Clinic  I discussed the limitations of evaluation and management by telemedicine. The patient expressed understanding and agreed to proceed.  Vital Signs: Because this visit was a virtual/telehealth visit, some criteria may be missing or patient reported. Any vitals not documented were not able to be obtained and vitals that have been documented are patient reported.  VideoError- Librarian, academic were attempted between this provider and patient, however failed, due to patient having technical difficulties OR patient did not have access to video capability.  We continued and completed visit with audio only.   Persons Participating in Visit: Patient.  AWV Questionnaire: No: Patient Medicare AWV questionnaire was not completed prior to this visit.  Cardiac Risk Factors include: advanced age (>42men, >65 women);male gender     Objective:    Today's Vitals   There is no height or weight on file to calculate BMI.     05/23/2024    1:30 PM 05/18/2023   10:50 AM 07/19/2019   10:12 AM 03/18/2018   10:51 AM 02/18/2017   10:00 AM  Advanced Directives  Does Patient Have a Medical Advance Directive? No No No No  No   Would patient like information on creating a medical advance directive? No - Patient declined  No - Patient declined       Data saved with a previous flowsheet row definition     Current Medications (verified) Outpatient Encounter Medications as of 05/23/2024  Medication Sig   aspirin  EC 81 MG tablet Take 1 tablet (81 mg total) by mouth daily.   atorvastatin  (LIPITOR) 40 MG tablet TAKE 1 TABLET EVERY DAY   Coenzyme Q10 (CO Q-10) 100 MG CAPS Take by mouth daily.   metFORMIN  (GLUCOPHAGE ) 500 MG tablet TAKE 1 TABLET EVERY DAY WITH BREAKFAST   naproxen sodium (ALEVE) 220 MG tablet Take 220 mg by mouth. Takes PRN   No facility-administered encounter medications on file as of 05/23/2024.    Allergies (verified) Penicillins   History: Past Medical History:  Diagnosis Date   Abnormal CT of the chest 06/08/2018   Allergy    mild   Arthritis    Atherosclerosis of aorta (HCC) 06/08/2018   Cancer (HCC) 2022   skin cancer   Cataract    removed right eye   Coronary artery disease involving native heart without angina pectoris 06/08/2018   Diabetes mellitus without complication (HCC)    pre diabetes   Erectile dysfunction 06/08/2018   Eye disease 02/18/2017   GERD (gastroesophageal reflux disease)    occ   HDL deficiency 02/19/2017   Hyperlipidemia    Impacted cerumen of right ear 02/18/2017   Leukocytosis 02/19/2017   Mixed dyslipidemia 02/19/2017   Overweight 02/18/2017   Smoking    Tobacco use 02/18/2017   Past Surgical History:  Procedure Laterality Date   COLONOSCOPY     EYE SURGERY     gets therapy injections for vessel disease, hx/o cataract surgery  MOUTH SURGERY     Family History  Problem Relation Age of Onset   Other Mother        eye disease, died of old age   Other Father        health history unknown   Cancer Neg Hx    Diabetes Neg Hx    Heart disease Neg Hx    Stroke Neg Hx    Hyperlipidemia Neg Hx    Hypertension Neg Hx    Colon cancer Neg Hx    Colon polyps Neg Hx    Rectal cancer Neg Hx    Stomach cancer Neg Hx    Social History   Socioeconomic History   Marital status: Divorced    Spouse name: Not on file    Number of children: Not on file   Years of education: Not on file   Highest education level: Not on file  Occupational History   Not on file  Tobacco Use   Smoking status: Every Day    Current packs/day: 1.00    Average packs/day: 1 pack/day for 51.0 years (51.0 ttl pk-yrs)    Types: Cigarettes   Smokeless tobacco: Never   Tobacco comments:    1pk daily 02/23/24  Vaping Use   Vaping status: Never Used  Substance and Sexual Activity   Alcohol use: Not Currently   Drug use: No   Sexual activity: Not on file  Other Topics Concern   Not on file  Social History Narrative   Alone.   Retired as of 2018.   Was at [employer].   Exercise - yard work, Programmer, applications around, walks.  Eats relatively healthy.   Has 2 children, 1 in [place], 1 in [place], 4 grandchildren.  Talks with his child in [place] every week.  Collects coins, other hobbies.  06/2023   Social Drivers of Health   Financial Resource Strain: Low Risk  (05/23/2024)   Overall Financial Resource Strain (CARDIA)    Difficulty of Paying Living Expenses: Not hard at all  Food Insecurity: No Food Insecurity (05/23/2024)   Hunger Vital Sign    Worried About Running Out of Food in the Last Year: Never true    Ran Out of Food in the Last Year: Never true  Transportation Needs: No Transportation Needs (05/23/2024)   PRAPARE - Administrator, Civil Service (Medical): No    Lack of Transportation (Non-Medical): No  Physical Activity: Insufficiently Active (05/23/2024)   Exercise Vital Sign    Days of Exercise per Week: 2 days    Minutes of Exercise per Session: 30 min  Stress: No Stress Concern Present (05/23/2024)   [employer] of Occupational Health - Occupational Stress Questionnaire    Feeling of Stress: Not at all  Social Connections: Unknown (05/23/2024)   Social Connection and Isolation Panel    Frequency of Communication with Friends and Family: Twice a week    Frequency of Social Gatherings with Friends and Family:  Not on file    Attends Religious Services: Never    Database administrator or Organizations: No    Attends Banker Meetings: Never    Marital Status: Not on file    Tobacco Counseling Ready to quit: Not Answered Counseling given: Not Answered Tobacco comments: 1pk daily 02/23/24    Clinical Intake:  Pre-visit preparation completed: Yes  Pain : No/denies pain     Nutritional Risks: None Diabetes: No  Lab Results  Component Value Date   HGBA1C  6.3 (H) 06/22/2023   HGBA1C 6.2 (H) 05/29/2022   HGBA1C 6.3 (H) 05/28/2021     How often do you need to have someone help you when you read instructions, pamphlets, or other written materials from your doctor or pharmacy?: 1 - Never  Interpreter Needed?: No  Information entered by :: NAllen LPN   Activities of Daily Living     05/23/2024    1:26 PM  In your present state of health, do you have any difficulty performing the following activities:  Hearing? 0  Vision? 0  Difficulty concentrating or making decisions? 0  Walking or climbing stairs? 0  Dressing or bathing? 0  Doing errands, shopping? 0  Preparing Food and eating ? N  Using the Toilet? N  In the past six months, have you accidently leaked urine? N  Do you have problems with loss of bowel control? N  Managing your Medications? N  Managing your Finances? N  Housekeeping or managing your Housekeeping? N    Patient Care Team: Tysinger, Alm RAMAN, PA-C as PCP - General (Family Medicine) Delford Maude BROCKS, MD as PCP - Cardiology (Cardiology) Robinson Idol, MD as Consulting Physician (Ophthalmology) Elner Arley LABOR, MD as Consulting Physician (Ophthalmology)  I have updated your Care Teams any recent Medical Services you may have received from other providers in the past year.     Assessment:   This is a routine wellness examination for Brent Espinoza.  Hearing/Vision screen Hearing Screening - Comments:: Denies hearing issues Vision Screening -  Comments:: Regular eye exams, Dr. Robinson   Goals Addressed             This Visit's Progress    Patient Stated       05/23/2024, stay healthy       Depression Screen     05/23/2024    1:32 PM 06/22/2023    9:16 AM 05/18/2023   10:52 AM 05/29/2022    9:45 AM 05/28/2021   10:22 AM 07/19/2019   10:13 AM 02/22/2018    1:01 PM  PHQ 2/9 Scores  PHQ - 2 Score 0 0 0 1 0 0 0  PHQ- 9 Score 0  0        Fall Risk     05/23/2024    1:31 PM 02/23/2024    1:20 PM 01/20/2024   10:27 AM 06/22/2023    9:16 AM 05/14/2023    9:44 AM  Fall Risk   Falls in the past year? 0 0 0 0 0  Number falls in past yr: 0   0 0  Injury with Fall? 0   0 0  Risk for fall due to : Medication side effect   No Fall Risks Medication side effect  Follow up Falls evaluation completed;Falls prevention discussed   Falls evaluation completed Falls prevention discussed;Falls evaluation completed    MEDICARE RISK AT HOME:  Medicare Risk at Home Any stairs in or around the home?: Yes If so, are there any without handrails?: No Home free of loose throw rugs in walkways, pet beds, electrical cords, etc?: Yes Adequate lighting in your home to reduce risk of falls?: Yes Life alert?: No Use of a cane, walker or w/c?: No Grab bars in the bathroom?: Yes Shower chair or bench in shower?: No Elevated toilet seat or a handicapped toilet?: No  TIMED UP AND GO:  Was the test performed?  No  Cognitive Function: 6CIT completed        05/23/2024  1:32 PM 05/18/2023   10:53 AM  6CIT Screen  What Year? 0 points 0 points  What month? 0 points 0 points  What time? 0 points 0 points  Count back from 20 0 points 0 points  Months in reverse 0 points 0 points  Repeat phrase 0 points 0 points  Total Score 0 points 0 points    Immunizations Immunization History  Administered Date(s) Administered   Fluad Quad(high Dose 65+) 06/13/2019   Influenza Split 10/10/2013   Influenza, High Dose Seasonal PF 07/13/2017, 08/01/2018,  08/01/2018, 07/05/2023   Influenza,inj,Quad PF,6+ Mos 08/21/2014   Influenza-Unspecified 06/24/2016, 06/30/2021   PFIZER Comirnaty(Gray Top)Covid-19 Tri-Sucrose Vaccine 01/18/2020, 02/12/2020, 09/19/2020   PNEUMOCOCCAL CONJUGATE-20 08/17/2023   Pneumococcal Conjugate-13 06/24/2016   Unspecified SARS-COV-2 Vaccination 07/05/2023    Screening Tests Health Maintenance  Topic Date Due   COVID-19 Vaccine (5 - 2024-25 season) 01/02/2024   INFLUENZA VACCINE  05/12/2024   Lung Cancer Screening  04/25/2025   Medicare Annual Wellness (AWV)  05/23/2025   Colonoscopy  07/30/2026   Hepatitis C Screening  Completed   Hepatitis B Vaccines  Aged Out   HPV VACCINES  Aged Out   Meningococcal B Vaccine  Aged Out   DTaP/Tdap/Td  Discontinued   Pneumococcal Vaccine: 50+ Years  Discontinued   Zoster Vaccines- Shingrix  Discontinued    Health Maintenance  Health Maintenance Due  Topic Date Due   COVID-19 Vaccine (5 - 2024-25 season) 01/02/2024   INFLUENZA VACCINE  05/12/2024   Health Maintenance Items Addressed: Due for flu vaccine.  Additional Screening:  Vision Screening: Recommended annual ophthalmology exams for early detection of glaucoma and other disorders of the eye. Would you like a referral to an eye doctor? No    Dental Screening: Recommended annual dental exams for proper oral hygiene  Community Resource Referral / Chronic Care Management: CRR required this visit?  No   CCM required this visit?  No   Plan:    I have personally reviewed and noted the following in the patient's chart:   Medical and social history Use of alcohol, tobacco or illicit drugs  Current medications and supplements including opioid prescriptions. Patient is not currently taking opioid prescriptions. Functional ability and status Nutritional status Physical activity Advanced directives List of other physicians Hospitalizations, surgeries, and ER visits in previous 12 months Vitals Screenings  to include cognitive, depression, and falls Referrals and appointments  In addition, I have reviewed and discussed with patient certain preventive protocols, quality metrics, and best practice recommendations. A written personalized care plan for preventive services as well as general preventive health recommendations were provided to patient.   Brent FORBES Dawn, LPN   [id number]   After Visit Summary: (MyChart) Due to this being a telephonic visit, the after visit summary with patients personalized plan was offered to patient via MyChart   Notes: Nothing significant to report at this time.

## 2024-05-29 ENCOUNTER — Encounter: Payer: Self-pay | Admitting: Medical

## 2024-05-29 DIAGNOSIS — H35371 Puckering of macula, right eye: Secondary | ICD-10-CM | POA: Diagnosis not present

## 2024-05-29 DIAGNOSIS — H34811 Central retinal vein occlusion, right eye, with macular edema: Secondary | ICD-10-CM | POA: Diagnosis not present

## 2024-05-29 DIAGNOSIS — H35352 Cystoid macular degeneration, left eye: Secondary | ICD-10-CM | POA: Diagnosis not present

## 2024-05-29 DIAGNOSIS — H353114 Nonexudative age-related macular degeneration, right eye, advanced atrophic with subfoveal involvement: Secondary | ICD-10-CM | POA: Diagnosis not present

## 2024-05-29 DIAGNOSIS — H2512 Age-related nuclear cataract, left eye: Secondary | ICD-10-CM | POA: Diagnosis not present

## 2024-05-29 DIAGNOSIS — H353121 Nonexudative age-related macular degeneration, left eye, early dry stage: Secondary | ICD-10-CM | POA: Diagnosis not present

## 2024-05-29 DIAGNOSIS — H43822 Vitreomacular adhesion, left eye: Secondary | ICD-10-CM | POA: Diagnosis not present

## 2024-06-08 ENCOUNTER — Encounter: Payer: Self-pay | Admitting: Emergency Medicine

## 2024-06-08 ENCOUNTER — Ambulatory Visit: Admitting: Emergency Medicine

## 2024-06-08 VITALS — BP 132/78 | HR 60 | Ht 68.0 in | Wt 157.2 lb

## 2024-06-08 DIAGNOSIS — J432 Centrilobular emphysema: Secondary | ICD-10-CM

## 2024-06-08 DIAGNOSIS — R911 Solitary pulmonary nodule: Secondary | ICD-10-CM | POA: Diagnosis not present

## 2024-06-08 DIAGNOSIS — F17210 Nicotine dependence, cigarettes, uncomplicated: Secondary | ICD-10-CM | POA: Diagnosis not present

## 2024-06-08 NOTE — Assessment & Plan Note (Signed)
 Spiculated right upper lobe nodule 8 x 5 mm.  No change on short interval repeat CT chest but it does have suspicious characteristics.  Explained to him that we can continue to follow with serial imaging but that if it changes in any way we should strongly consider either bronchoscopy or primary resection.  He would want bronchoscopy and a tissue diagnosis before committing to surgery.  We will plan to repeat your CT chest in 6 months, January 2026.  Follow with Dr. Shelah in January after your CT chest so we can review those results together.

## 2024-06-08 NOTE — Progress Notes (Signed)
 Subjective:    Patient ID: Brent Espinoza, male    DOB: 1951-05-16, 73 y.o.   MRN: [id number]  HPI  ROV 02/23/2024 --follow-up visit for 73 year old man with history of tobacco use, CAD, GERD, hyperlipidemia.  He was found to have a 7 mm spiculated right upper lobe nodule with some associated lymphadenopathy on lung cancer screening CT.  We arranged for a PET/CT to further evaluate as below He overall feels well. He has trepidation about surgery or bronchoscopy at this time. .   Super D CT chest 02/02/24 reviewed by me showed centrilobular emphysema, some dependent atelectasis.  There is a spiculated 8 x 6 mm right upper lobe pulmonary nodule, right precarinal 6 mm nodule, subcarinal nodule, both stable  PET scan 02/02/24 reviewed by me shows some mild SUV uptake in the right upper lobe nodule.  No uptake in his mediastinal nodes  ROV 06/08/2024 --Brent Espinoza is 76 with a history of tobacco use, CAD, GERD, hyperlipidemia.  I saw him in May for a 7 mm spiculated right upper lobe pulmonary nodule and some lymphadenopathy that was seen on a lung cancer screening CT chest.  We discussed possible referral for thoracic surgery versus bronchoscopy.  After discussion we ultimately decided to be conservative and follow his CT scan of the chest for interval stability or change.  His CT was done7/15/2025.  CT scan of the chest 04/25/2024 reviewed by me shows no significant change in a spiculated 8 x 5 mm spiculated nodule in the peripheral right upper lobe no significant mediastinal adenopathy.  Review of Systems As per HPI  Past Medical History:  Diagnosis Date   Abnormal CT of the chest 06/08/2018   Allergy    mild   Arthritis    Atherosclerosis of aorta (HCC) 06/08/2018   Cancer (HCC) 2022   skin cancer   Cataract    removed right eye   Coronary artery disease involving native heart without angina pectoris 06/08/2018   Diabetes mellitus without complication (HCC)    pre diabetes   Erectile  dysfunction 06/08/2018   Eye disease 02/18/2017   GERD (gastroesophageal reflux disease)    occ   HDL deficiency 02/19/2017   Hyperlipidemia    Impacted cerumen of right ear 02/18/2017   Leukocytosis 02/19/2017   Mixed dyslipidemia 02/19/2017   Overweight 02/18/2017   Smoking    Tobacco use 02/18/2017     Family History  Problem Relation Age of Onset   Other Mother        eye disease, died of old age   Other Father        health history unknown   Cancer Neg Hx    Diabetes Neg Hx    Heart disease Neg Hx    Stroke Neg Hx    Hyperlipidemia Neg Hx    Hypertension Neg Hx    Colon cancer Neg Hx    Colon polyps Neg Hx    Rectal cancer Neg Hx    Stomach cancer Neg Hx      Social History   Socioeconomic History   Marital status: Divorced    Spouse name: Not on file   Number of children: Not on file   Years of education: Not on file   Highest education level: Not on file  Occupational History   Not on file  Tobacco Use   Smoking status: Every Day    Current packs/day: 1.00    Average packs/day: 1 pack/day for 51.0 years (51.0 ttl pk-yrs)  Types: Cigarettes   Smokeless tobacco: Never   Tobacco comments:    1pk daily 02/23/24  Vaping Use   Vaping status: Never Used  Substance and Sexual Activity   Alcohol use: Not Currently   Drug use: No   Sexual activity: Not on file  Other Topics Concern   Not on file  Social History Narrative   Alone.   Retired as of 2018.   Was at [employer].   Exercise - yard work, Programmer, applications around, walks.  Eats relatively healthy.   Has 2 children, 1 in [place], 1 in [place], 4 grandchildren.  Talks with his child in [place] every week.  Collects coins, other hobbies.  06/2023   Social Drivers of Health   Financial Resource Strain: Low Risk  (05/23/2024)   Overall Financial Resource Strain (CARDIA)    Difficulty of Paying Living Expenses: Not hard at all  Food Insecurity: No Food Insecurity (05/23/2024)   Hunger Vital Sign    Worried About  Running Out of Food in the Last Year: Never true    Ran Out of Food in the Last Year: Never true  Transportation Needs: No Transportation Needs (05/23/2024)   PRAPARE - Administrator, Civil Service (Medical): No    Lack of Transportation (Non-Medical): No  Physical Activity: Insufficiently Active (05/23/2024)   Exercise Vital Sign    Days of Exercise per Week: 2 days    Minutes of Exercise per Session: 30 min  Stress: No Stress Concern Present (05/23/2024)   [employer] of Occupational Health - Occupational Stress Questionnaire    Feeling of Stress: Not at all  Social Connections: Unknown (05/23/2024)   Social Connection and Isolation Panel    Frequency of Communication with Friends and Family: Twice a week    Frequency of Social Gatherings with Friends and Family: Not on file    Attends Religious Services: Never    Database administrator or Organizations: No    Attends Banker Meetings: Never    Marital Status: Not on file  Intimate Partner Violence: Not At Risk (05/23/2024)   Humiliation, Afraid, Rape, and Kick questionnaire    Fear of Current or Ex-Partner: No    Emotionally Abused: No    Physically Abused: No    Sexually Abused: No     Allergies  Allergen Reactions   Penicillins     As child      Outpatient Medications Prior to Visit  Medication Sig Dispense Refill   aspirin  EC 81 MG tablet Take 1 tablet (81 mg total) by mouth daily. 90 tablet 3   atorvastatin  (LIPITOR) 40 MG tablet TAKE 1 TABLET EVERY DAY 90 tablet 3   Coenzyme Q10 (CO Q-10) 100 MG CAPS Take by mouth daily.     metFORMIN  (GLUCOPHAGE ) 500 MG tablet TAKE 1 TABLET EVERY DAY WITH BREAKFAST 90 tablet 3   naproxen sodium (ALEVE) 220 MG tablet Take 220 mg by mouth. Takes PRN     No facility-administered medications prior to visit.         Objective:   Physical Exam Vitals:   06/08/24 1001  BP: 132/78  Pulse: 60  SpO2: 98%  Weight: 157 lb 3.2 oz (71.3 kg)  Height: 5' 8  (1.727 m)    Gen: Pleasant, well-nourished, in no distress,  normal affect  ENT: No lesions,  mouth clear,  oropharynx clear, no postnasal drip  Neck: No JVD, no stridor  Lungs: No use of accessory muscles, no crackles  or wheezing on normal respiration, no wheeze on forced expiration  Cardiovascular: RRR, heart sounds normal, no murmur or gallops, no peripheral edema  Musculoskeletal: No deformities, no cyanosis or clubbing  Neuro: alert, awake, non focal  Skin: Warm, no lesions or rash      Assessment & Plan:   Nodule of right lung Spiculated right upper lobe nodule 8 x 5 mm.  No change on short interval repeat CT chest but it does have suspicious characteristics.  Explained to him that we can continue to follow with serial imaging but that if it changes in any way we should strongly consider either bronchoscopy or primary resection.  He would want bronchoscopy and a tissue diagnosis before committing to surgery.  We will plan to repeat your CT chest in 6 months, January 2026.  Follow with Dr. Shelah in January after your CT chest so we can review those results together.    Lamar Shelah, MD, PhD 06/08/2024, 5:13 PM [facility] [phone] or if no answer before 7:00PM call [phone] For any issues after 7:00PM please call eLink [phone]

## 2024-06-08 NOTE — Patient Instructions (Addendum)
 We will plan to repeat your CT chest in 6 months, January 2026.  Follow with Dr. Shelah in January after your CT chest so we can review those results together.

## 2024-06-13 DIAGNOSIS — H524 Presbyopia: Secondary | ICD-10-CM | POA: Diagnosis not present

## 2024-06-28 ENCOUNTER — Encounter: Payer: Self-pay | Admitting: Medical

## 2024-06-28 ENCOUNTER — Ambulatory Visit (INDEPENDENT_AMBULATORY_CARE_PROVIDER_SITE_OTHER): Payer: Medicare HMO | Admitting: Medical

## 2024-06-28 VITALS — BP 122/72 | HR 63 | Ht 67.5 in | Wt 158.4 lb

## 2024-06-28 DIAGNOSIS — Z7189 Other specified counseling: Secondary | ICD-10-CM

## 2024-06-28 DIAGNOSIS — R911 Solitary pulmonary nodule: Secondary | ICD-10-CM | POA: Diagnosis not present

## 2024-06-28 DIAGNOSIS — I2510 Atherosclerotic heart disease of native coronary artery without angina pectoris: Secondary | ICD-10-CM

## 2024-06-28 DIAGNOSIS — Z7185 Encounter for immunization safety counseling: Secondary | ICD-10-CM | POA: Diagnosis not present

## 2024-06-28 DIAGNOSIS — I739 Peripheral vascular disease, unspecified: Secondary | ICD-10-CM | POA: Diagnosis not present

## 2024-06-28 DIAGNOSIS — Z720 Tobacco use: Secondary | ICD-10-CM | POA: Diagnosis not present

## 2024-06-28 DIAGNOSIS — Z0000 Encounter for general adult medical examination without abnormal findings: Secondary | ICD-10-CM

## 2024-06-28 DIAGNOSIS — Z1389 Encounter for screening for other disorder: Secondary | ICD-10-CM

## 2024-06-28 DIAGNOSIS — I700 Atherosclerosis of aorta: Secondary | ICD-10-CM | POA: Diagnosis not present

## 2024-06-28 DIAGNOSIS — R7301 Impaired fasting glucose: Secondary | ICD-10-CM | POA: Diagnosis not present

## 2024-06-28 DIAGNOSIS — E782 Mixed hyperlipidemia: Secondary | ICD-10-CM

## 2024-06-28 NOTE — Patient Instructions (Signed)
 This visit was a preventative care visit, also known as wellness visit or routine physical.   Topics typically include healthy lifestyle, diet, exercise, preventative care, vaccinations, sick and well care, proper use of emergency dept and after hours care, as well as other concerns.     Recommendations: Continue to return yearly for your annual wellness and preventative care visits.  This gives us  a chance to discuss healthy lifestyle, exercise, vaccinations, review your chart record, and perform screenings where appropriate.  I recommend you see your eye doctor yearly for routine vision care.  I recommend you see your dentist yearly for routine dental care including hygiene visits twice yearly.   Vaccination recommendations were reviewed Immunization History  Administered Date(s) Administered   Fluad Quad(high Dose 65+) 06/13/2019   INFLUENZA, HIGH DOSE SEASONAL PF 07/13/2017, 08/01/2018, 08/01/2018, 07/05/2023   Influenza Split 10/10/2013   Influenza,inj,Quad PF,6+ Mos 08/21/2014   Influenza-Unspecified 06/24/2016, 06/30/2021   PFIZER Comirnaty(Gray Top)Covid-19 Tri-Sucrose Vaccine 01/18/2020, 02/12/2020, 09/19/2020   PNEUMOCOCCAL CONJUGATE-20 08/17/2023   Pneumococcal Conjugate-13 06/24/2016   Unspecified SARS-COV-2 Vaccination 07/05/2023   Advised shingrix, tdap, flu.  He declines vaccines.   Screening for cancer: Colon cancer screening: I reviewed your colonoscopy on file that is up to date from 07/2021.  Repeat 2027.  Skin cancer screening: Check your skin regularly for new changes, growing lesions, or other lesions of concern Come in for evaluation if you have skin lesions of concern. Continue routine follow-up with dermatology  Lung cancer screening: Continue follow up with pulmonology as planned  We currently don't have screenings for other cancers besides breast, cervical, colon, and lung cancers.  If you have a strong family history of cancer or have other cancer  screening concerns, please let me know.    Bone health: Get at least 150 minutes of aerobic exercise weekly Get weight bearing exercise at least once weekly Bone density test:  A bone density test is an imaging test that uses a type of X-ray to measure the amount of calcium  and other minerals in your bones. The test may be used to diagnose or screen you for a condition that causes weak or thin bones (osteoporosis), predict your risk for a broken bone (fracture), or determine how well your osteoporosis treatment is working. The bone density test is recommended for females 65 and older, or females or males <65 if certain risk factors such as thyroid disease, long term use of steroids such as for asthma or rheumatological issues, vitamin D  deficiency, estrogen deficiency, family history of osteoporosis, self or family history of fragility fracture in first degree relative.    Heart health: Get at least 150 minutes of aerobic exercise weekly Limit alcohol It is important to maintain a healthy blood pressure and healthy cholesterol numbers  Heart disease screening: Screening for heart disease includes screening for blood pressure, fasting lipids, glucose/diabetes screening, BMI height to weight ratio, reviewed of smoking status, physical activity, and diet.    Goals include blood pressure 120/80 or less, maintaining a healthy lipid/cholesterol profile, preventing diabetes or keeping diabetes numbers under good control, not smoking or using tobacco products, exercising most days per week or at least 150 minutes per week of exercise, and eating healthy variety of fruits and vegetables, healthy oils, and avoiding unhealthy food choices like fried food, fast food, high sugar and high cholesterol foods.    CT chest 06/28/22: IMPRESSION: 1. Lung-RADS 2, benign appearance or behavior. Continue annual screening with low-dose chest CT without contrast in 12  months. Which 2. Diffuse bronchial wall  thickening with emphysema, as above; imaging findings suggestive of underlying COPD. 3. Coronary artery calcifications. 4. Aortic Atherosclerosis (ICD10-I70.0) and Emphysema (ICD10-J43.9).   Abdominal ultrasound September 2024 showed no abdominal aortic aneurysm but there is 50% narrowing of left common iliac artery   Medical care options: I recommend you continue to seek care here first for routine care.  We try really hard to have available appointments Monday through Friday daytime hours for sick visits, acute visits, and physicals.  Urgent care should be used for after hours and weekends for significant issues that cannot wait till the next day.  The emergency department should be used for significant potentially life-threatening emergencies.  The emergency department is expensive, can often have long wait times for less significant concerns, so try to utilize primary care, urgent care, or telemedicine when possible to avoid unnecessary trips to the emergency department.  Virtual visits and telemedicine have been introduced since the pandemic started in 2020, and can be convenient ways to receive medical care.  We offer virtual appointments as well to assist you in a variety of options to seek medical care.    Separate significant issues discussed: Vein occlusion and eye issues-continue routine follow-up with ophthalmology  Hyperlipidemia-continue cholesterol medication daily, atorvastatin  40mg  daily, aspirin  81 mg daily, coenzyme Q 10..  Labs today.  Eat a healthy low-cholesterol diet.  We discussed possibly adding a stronger anticoagulant such as Xarelto but he declines.  Advised smoking cessation  Peripheral arterial disease-I strongly recommend you quit smoking.  Continue walking for exercise.  Continue aspirin  and cholesterol pill daily.   Impaired glucose, prediabetes-updated labs today.  Continue metformin  500 mg daily  Atherosclerosis of aorta-continue cholesterol medication  aspirin  daily and I recommend you quit smoking  Tobacco use -I recommend you quit smoking  Constipation Consider fiber supplement daily such as Metamucil or FiberCon You can use over-the-counter MiraLAX 1 dose daily to help with constipation Drink at least 80 to 100 ounces of water daily  Lung nodule-follow-up with pulmonology as planned.  We discussed the CT and PET findings from 2025.  He is using a watch and wait approach

## 2024-06-28 NOTE — Progress Notes (Signed)
 Subjective:    Brent Espinoza is a 73 y.o. male who presents for Preventative Services visit and chronic medical problems/med check visit.    Primary Care Provider [place], Alm RAMAN, PA-C here for primary care  Current Health Care Team: Dentist, [place] Dr. Elspeth Naval, GI Dr. Arley Ruder, Dr. Arlena Reusing, ophthalmology Dr. Deatrice Cage, cardiology Dr. Norleen Hurst, dermatology  Concerns: Brent Espinoza is a 73 year old male who presents for a routine well visit.  He has a history of a lung nodule measuring 0.8 by 0.5 cm, which is speculated and shows abnormal uptake on a PET scan. The nodule has not changed in size over multiple scans this year. He and his pulmonologist have opted for a watchful waiting approach.  He has hyperlipidemia with cholesterol buildup in the coronary arteries and aorta, as noted on a recent CT chest scan. He is currently on Lipitor and aspirin . He experiences easy bruising. No chest pain or difficulty with exercise, and no pain in his legs while walking. He walks regularly for exercise.  He reports occasional constipation, going two to three days without a bowel movement, followed by sudden urgency. He acknowledges not drinking enough water and consuming limited fiber in his diet.  He has not received the tetanus or shingles vaccines and declined the flu shot during this visit. His last colonoscopy was in October 2022, which revealed one polyp, and a repeat colonoscopy was recommended in five years. He quit drinking alcohol a couple of years ago but continues to smoke regularly. He is on aspirin , Lipitor, metformin  500 mg once a day, Aleve as needed, and coenzyme Q10. He has a penicillin allergy.  Past Medical History:  Diagnosis Date   Abnormal CT of the chest 06/08/2018   Allergy    mild   Arthritis    Atherosclerosis of aorta (HCC) 06/08/2018   Cancer (HCC) 2022   skin cancer   Cataract    removed right eye    Coronary artery disease involving native heart without angina pectoris 06/08/2018   Diabetes mellitus without complication (HCC)    pre diabetes   Erectile dysfunction 06/08/2018   Eye disease 02/18/2017   GERD (gastroesophageal reflux disease)    occ   HDL deficiency 02/19/2017   Hyperlipidemia    Impacted cerumen of right ear 02/18/2017   Leukocytosis 02/19/2017   Mixed dyslipidemia 02/19/2017   Overweight 02/18/2017   Smoking    Tobacco use 02/18/2017    Past Surgical History:  Procedure Laterality Date   COLONOSCOPY  07/2021   Dr. Naval , repeat 5 years   EYE SURGERY     gets therapy injections for vessel disease, hx/o cataract surgery   MOUTH SURGERY      Social History   Socioeconomic History   Marital status: Divorced    Spouse name: Not on file   Number of children: Not on file   Years of education: Not on file   Highest education level: Not on file  Occupational History   Not on file  Tobacco Use   Smoking status: Every Day    Current packs/day: 1.00    Average packs/day: 1 pack/day for 51.0 years (51.0 ttl pk-yrs)    Types: Cigarettes   Smokeless tobacco: Never   Tobacco comments:    1pk daily 02/23/24  Vaping Use   Vaping status: Never Used  Substance and Sexual Activity   Alcohol use: Not Currently   Drug use: No  Sexual activity: Not on file  Other Topics Concern   Not on file  Social History Narrative   Alone.   Retired as of 2018.   Was at [employer].   Exercise - yard work, Programmer, applications around, walks.  Eats relatively healthy.   Has 2 children, 1 in [place], 1 in [place], 4 grandchildren.  Talks with his child in [place] every week.  Collects coins, other hobbies.  06/2024   Social Drivers of Health   Financial Resource Strain: Low Risk  (05/23/2024)   Overall Financial Resource Strain (CARDIA)    Difficulty of Paying Living Expenses: Not hard at all  Food Insecurity: No Food Insecurity (05/23/2024)   Hunger Vital Sign    Worried About  Running Out of Food in the Last Year: Never true    Ran Out of Food in the Last Year: Never true  Transportation Needs: No Transportation Needs (05/23/2024)   PRAPARE - Administrator, Civil Service (Medical): No    Lack of Transportation (Non-Medical): No  Physical Activity: Insufficiently Active (05/23/2024)   Exercise Vital Sign    Days of Exercise per Week: 2 days    Minutes of Exercise per Session: 30 min  Stress: No Stress Concern Present (05/23/2024)   [employer] of Occupational Health - Occupational Stress Questionnaire    Feeling of Stress: Not at all  Social Connections: Unknown (05/23/2024)   Social Connection and Isolation Panel    Frequency of Communication with Friends and Family: Twice a week    Frequency of Social Gatherings with Friends and Family: Not on file    Attends Religious Services: Never    Database administrator or Organizations: No    Attends Banker Meetings: Never    Marital Status: Not on file  Intimate Partner Violence: Not At Risk (05/23/2024)   Humiliation, Afraid, Rape, and Kick questionnaire    Fear of Current or Ex-Partner: No    Emotionally Abused: No    Physically Abused: No    Sexually Abused: No    Family History  Problem Relation Age of Onset   Other Mother        eye disease, died of old age   Other Father        health history unknown   Cancer Neg Hx    Diabetes Neg Hx    Heart disease Neg Hx    Stroke Neg Hx    Hyperlipidemia Neg Hx    Hypertension Neg Hx    Colon cancer Neg Hx    Colon polyps Neg Hx    Rectal cancer Neg Hx    Stomach cancer Neg Hx      Current Outpatient Medications:    aspirin  EC 81 MG tablet, Take 1 tablet (81 mg total) by mouth daily., Disp: 90 tablet, Rfl: 3   atorvastatin  (LIPITOR) 40 MG tablet, TAKE 1 TABLET EVERY DAY, Disp: 90 tablet, Rfl: 3   Coenzyme Q10 (CO Q-10) 100 MG CAPS, Take by mouth daily., Disp: , Rfl:    metFORMIN  (GLUCOPHAGE ) 500 MG tablet, TAKE 1 TABLET  EVERY DAY WITH BREAKFAST, Disp: 90 tablet, Rfl: 3   naproxen sodium (ALEVE) 220 MG tablet, Take 220 mg by mouth. Takes PRN, Disp: , Rfl:   Allergies  Allergen Reactions   Penicillins     As child     History reviewed: allergies, current medications, past family history, past medical history, past social history, past surgical history and problem list  Objective:   Biometrics BP 122/72   Pulse 63   Ht 5' 7.5 (1.715 m)   Wt 158 lb 6.4 oz (71.8 kg)   SpO2 100%   BMI 24.44 kg/m   Wt Readings from Last 3 Encounters:  06/28/24 158 lb 6.4 oz (71.8 kg)  06/08/24 157 lb 3.2 oz (71.3 kg)  02/23/24 162 lb 12.8 oz (73.8 kg)   Gen: wd, wn nad, tobacco odor Skin: Flaky irritated skin along the face in general chronic, scattered small benign hemangiomas of torso, no worrisome lesion Scattered macules, no worrisome lesions HEENT: normocephalic, sclerae anicteric, nares patent, no discharge or erythema, pharynx normal Oral cavity: MMM, no lesions Neck: supple, no lymphadenopathy, no thyromegaly, no masses, no bruits Heart: RRR, normal S1, S2, no murmurs Lungs: Decreased lower lung fields, otherwise clear without wheezes or rhonchi or rales.   Abdomen: +bs, soft, non tender, non distended, no masses, no hepatomegaly, no splenomegaly, no bruits Musculoskeletal: nontender, no swelling, no obvious deformity Extremities: no edema, no cyanosis, no clubbing Pulses: 2+ symmetric, upper and 1+ lower extremities, normal cap refill Neurological: alert, oriented x 3, CN2-12 intact, strength normal upper extremities and lower extremities, sensation normal throughout, DTRs 2+ throughout, no cerebellar signs, gait normal Psychiatric: normal affect, behavior normal, pleasant  GU/rectal - deferred/declined   Assessment:   Encounter Diagnoses  Name Primary?   Encounter for health maintenance examination in adult Yes   Impaired fasting blood sugar    Mixed dyslipidemia    Nodule of right lung     PAD (peripheral artery disease) (HCC)    Tobacco use    Vaccine counseling    Advance directive discussed with patient    Atherosclerosis of aorta (HCC)    Coronary artery disease involving native coronary artery of native heart without angina pectoris    Screening for hematuria or proteinuria       Plan:   This visit was a preventative care visit, also known as wellness visit or routine physical.   Topics typically include healthy lifestyle, diet, exercise, preventative care, vaccinations, sick and well care, proper use of emergency dept and after hours care, as well as other concerns.     Recommendations: Continue to return yearly for your annual wellness and preventative care visits.  This gives us  a chance to discuss healthy lifestyle, exercise, vaccinations, review your chart record, and perform screenings where appropriate.  I recommend you see your eye doctor yearly for routine vision care.  I recommend you see your dentist yearly for routine dental care including hygiene visits twice yearly.   Vaccination recommendations were reviewed Immunization History  Administered Date(s) Administered   Fluad Quad(high Dose 65+) 06/13/2019   INFLUENZA, HIGH DOSE SEASONAL PF 07/13/2017, 08/01/2018, 08/01/2018, 07/05/2023   Influenza Split 10/10/2013   Influenza,inj,Quad PF,6+ Mos 08/21/2014   Influenza-Unspecified 06/24/2016, 06/30/2021   PFIZER Comirnaty(Gray Top)Covid-19 Tri-Sucrose Vaccine 01/18/2020, 02/12/2020, 09/19/2020   PNEUMOCOCCAL CONJUGATE-20 08/17/2023   Pneumococcal Conjugate-13 06/24/2016   Unspecified SARS-COV-2 Vaccination 07/05/2023   Advised shingrix, tdap, flu.  He declines vaccines.   Screening for cancer: Colon cancer screening: I reviewed your colonoscopy on file that is up to date from 07/2021.  Repeat 2027.  Skin cancer screening: Check your skin regularly for new changes, growing lesions, or other lesions of concern Come in for evaluation if you have  skin lesions of concern. Continue routine follow-up with dermatology  Lung cancer screening: Continue follow up with pulmonology as planned  We currently don't have screenings for  other cancers besides breast, cervical, colon, and lung cancers.  If you have a strong family history of cancer or have other cancer screening concerns, please let me know.    Bone health: Get at least 150 minutes of aerobic exercise weekly Get weight bearing exercise at least once weekly Bone density test:  A bone density test is an imaging test that uses a type of X-ray to measure the amount of calcium  and other minerals in your bones. The test may be used to diagnose or screen you for a condition that causes weak or thin bones (osteoporosis), predict your risk for a broken bone (fracture), or determine how well your osteoporosis treatment is working. The bone density test is recommended for females 65 and older, or females or males <65 if certain risk factors such as thyroid disease, long term use of steroids such as for asthma or rheumatological issues, vitamin D  deficiency, estrogen deficiency, family history of osteoporosis, self or family history of fragility fracture in first degree relative.    Heart health: Get at least 150 minutes of aerobic exercise weekly Limit alcohol It is important to maintain a healthy blood pressure and healthy cholesterol numbers  Heart disease screening: Screening for heart disease includes screening for blood pressure, fasting lipids, glucose/diabetes screening, BMI height to weight ratio, reviewed of smoking status, physical activity, and diet.    Goals include blood pressure 120/80 or less, maintaining a healthy lipid/cholesterol profile, preventing diabetes or keeping diabetes numbers under good control, not smoking or using tobacco products, exercising most days per week or at least 150 minutes per week of exercise, and eating healthy variety of fruits and vegetables,  healthy oils, and avoiding unhealthy food choices like fried food, fast food, high sugar and high cholesterol foods.    CT chest 06/28/22: IMPRESSION: 1. Lung-RADS 2, benign appearance or behavior. Continue annual screening with low-dose chest CT without contrast in 12 months. Which 2. Diffuse bronchial wall thickening with emphysema, as above; imaging findings suggestive of underlying COPD. 3. Coronary artery calcifications. 4. Aortic Atherosclerosis (ICD10-I70.0) and Emphysema (ICD10-J43.9).   Abdominal ultrasound September 2024 showed no abdominal aortic aneurysm but there is 50% narrowing of left common iliac artery   Medical care options: I recommend you continue to seek care here first for routine care.  We try really hard to have available appointments Monday through Friday daytime hours for sick visits, acute visits, and physicals.  Urgent care should be used for after hours and weekends for significant issues that cannot wait till the next day.  The emergency department should be used for significant potentially life-threatening emergencies.  The emergency department is expensive, can often have long wait times for less significant concerns, so try to utilize primary care, urgent care, or telemedicine when possible to avoid unnecessary trips to the emergency department.  Virtual visits and telemedicine have been introduced since the pandemic started in 2020, and can be convenient ways to receive medical care.  We offer virtual appointments as well to assist you in a variety of options to seek medical care.    Separate significant issues discussed: Vein occlusion and eye issues-continue routine follow-up with ophthalmology  Hyperlipidemia-continue cholesterol medication daily, atorvastatin  40mg  daily, aspirin  81 mg daily, coenzyme Q 10..  Labs today.  Eat a healthy low-cholesterol diet.  We discussed possibly adding a stronger anticoagulant such as Xarelto but he declines.  Advised  smoking cessation  Peripheral arterial disease-I strongly recommend you quit smoking.  Continue walking for exercise.  Continue aspirin  and cholesterol pill daily.   Impaired glucose, prediabetes-updated labs today.  Continue metformin  500 mg daily  Atherosclerosis of aorta-continue cholesterol medication aspirin  daily and I recommend you quit smoking  Tobacco use -I recommend you quit smoking  Constipation Consider fiber supplement daily such as Metamucil or FiberCon You can use over-the-counter MiraLAX 1 dose daily to help with constipation Drink at least 80 to 100 ounces of water daily  Lung nodule-follow-up with pulmonology as planned.  We discussed the CT and PET findings from 2025.  He is using a watch and wait approach    Day Deery was seen today for annual exam.  Diagnoses and all orders for this visit:  Encounter for health maintenance examination in adult -     CBC -     Comprehensive metabolic panel with GFR -     Lipid panel -     Hemoglobin A1c -     Urinalysis, Routine w reflex microscopic -     VITAMIN D  25 Hydroxy (Vit-D Deficiency, Fractures)  Impaired fasting blood sugar -     Hemoglobin A1c  Mixed dyslipidemia -     Lipid panel  Nodule of right lung  PAD (peripheral artery disease) (HCC)  Tobacco use  Vaccine counseling  Advance directive discussed with patient  Atherosclerosis of aorta (HCC) -     Lipid panel  Coronary artery disease involving native coronary artery of native heart without angina pectoris -     Lipid panel  Screening for hematuria or proteinuria -     Urinalysis, Routine w reflex microscopic     F/u pending labs

## 2024-06-29 ENCOUNTER — Other Ambulatory Visit: Payer: Self-pay | Admitting: Medical

## 2024-06-29 ENCOUNTER — Ambulatory Visit: Payer: Self-pay | Admitting: Medical

## 2024-06-29 DIAGNOSIS — Z720 Tobacco use: Secondary | ICD-10-CM

## 2024-06-29 DIAGNOSIS — E782 Mixed hyperlipidemia: Secondary | ICD-10-CM

## 2024-06-29 LAB — URINALYSIS, ROUTINE W REFLEX MICROSCOPIC
Bilirubin, UA: NEGATIVE
Glucose, UA: NEGATIVE
Ketones, UA: NEGATIVE
Leukocytes,UA: NEGATIVE
Nitrite, UA: NEGATIVE
Protein,UA: NEGATIVE
RBC, UA: NEGATIVE
Specific Gravity, UA: 1.009 (ref 1.005–1.030)
Urobilinogen, Ur: 0.2 mg/dL (ref 0.2–1.0)
pH, UA: 5.5 (ref 5.0–7.5)

## 2024-06-29 LAB — CBC
Hematocrit: 44 % (ref 37.5–51.0)
Hemoglobin: 14.6 g/dL (ref 13.0–17.7)
MCH: 27.4 pg (ref 26.6–33.0)
MCHC: 33.2 g/dL (ref 31.5–35.7)
MCV: 83 fL (ref 79–97)
Platelets: 267 x10E3/uL (ref 150–450)
RBC: 5.32 x10E6/uL (ref 4.14–5.80)
RDW: 15.2 % (ref 11.6–15.4)
WBC: 9 x10E3/uL (ref 3.4–10.8)

## 2024-06-29 LAB — COMPREHENSIVE METABOLIC PANEL WITH GFR
ALT: 15 IU/L (ref 0–44)
AST: 19 IU/L (ref 0–40)
Albumin: 4.2 g/dL (ref 3.8–4.8)
Alkaline Phosphatase: 122 IU/L (ref 47–123)
BUN/Creatinine Ratio: 13 (ref 10–24)
BUN: 13 mg/dL (ref 8–27)
Bilirubin Total: 0.3 mg/dL (ref 0.0–1.2)
CO2: 21 mmol/L (ref 20–29)
Calcium: 9.5 mg/dL (ref 8.6–10.2)
Chloride: 98 mmol/L (ref 96–106)
Creatinine, Ser: 1 mg/dL (ref 0.76–1.27)
Globulin, Total: 3.4 g/dL (ref 1.5–4.5)
Glucose: 112 mg/dL — ABNORMAL HIGH (ref 70–99)
Potassium: 4.3 mmol/L (ref 3.5–5.2)
Sodium: 136 mmol/L (ref 134–144)
Total Protein: 7.6 g/dL (ref 6.0–8.5)
eGFR: 79 mL/min/1.73 (ref >59)

## 2024-06-29 LAB — LIPID PANEL
Chol/HDL Ratio: 3.4 ratio (ref 0.0–5.0)
Cholesterol, Total: 93 mg/dL — ABNORMAL LOW (ref 100–199)
HDL: 27 mg/dL — ABNORMAL LOW (ref >39)
LDL Chol Calc (NIH): 51 mg/dL (ref 0–99)
Triglycerides: 72 mg/dL (ref 0–149)
VLDL Cholesterol Cal: 15 mg/dL (ref 5–40)

## 2024-06-29 LAB — HEMOGLOBIN A1C
Est. average glucose Bld gHb Est-mCnc: 128 mg/dL
Hgb A1c MFr Bld: 6.1 % — ABNORMAL HIGH (ref 4.8–5.6)

## 2024-06-29 LAB — VITAMIN D 25 HYDROXY (VIT D DEFICIENCY, FRACTURES): Vit D, 25-Hydroxy: 36.6 ng/mL (ref 30.0–100.0)

## 2024-06-29 MED ORDER — ASPIRIN 81 MG PO TBEC: 81.0000 mg | DELAYED_RELEASE_TABLET | Freq: Every day | ORAL | 3 refills | Status: AC

## 2024-06-29 MED ORDER — METFORMIN HCL 500 MG PO TABS: ORAL_TABLET | ORAL | 3 refills | Status: AC

## 2024-06-29 MED ORDER — ATORVASTATIN CALCIUM 40 MG PO TABS: 40.0000 mg | ORAL_TABLET | Freq: Every day | ORAL | 3 refills | Status: AC

## 2024-06-29 NOTE — Progress Notes (Signed)
 Results through MyChart

## 2024-07-12 NOTE — Progress Notes (Signed)
   07/12/2024  Patient ID: Brent Espinoza, male   DOB: 1951/06/09, 73 y.o.   MRN: [id number]  Pharmacy Quality Measure Review  This patient is appearing on a report for being at risk of failing the adherence measure for cholesterol (statin) medications this calendar year.   Medication: Atorvastatin  Last fill date: 07/04/24 for 90 day supply  Insurance report was not up to date. No action needed at this time.   Brent Espinoza, PharmD Clinical Pharmacist [phone]

## 2024-07-12 NOTE — Telephone Encounter (Signed)
 Called and left another detailed message for pt that Aspirin  OTC was fine to take. Called and left a message and notated the call at 3:55 that I left a message on his vm.   Copied from CRM #[id number]. Topic: General - Other >> Jul 12, 2024  3:59 PM Paige D wrote: Reason for CRM: Pt calling back in regards to a missed call, Advised pt it was someone one in the office and I would see if they are available. Called CAL and pt hung up . >> Jul 12, 2024  4:12 PM Yolanda T wrote: Patient called back again as he says never got a call back and then when he called he was hung up on several times. Apologized and advised would send the message to have the nurse call him back as Samule was available at this time per CAL. Patient stated if he did not get a call back today he would not be calling back nor returning to the practice

## 2024-07-12 NOTE — Telephone Encounter (Signed)
 Copied from CRM [id number]. Topic: General - Other >> Jul 12, 2024  9:37 AM Jasmin G wrote: Reason for CRM: Pt requested a call back at [phone] from one of Dr. Sanda nurses to discuss his aspirin  EC 81 MG tablet prescription, he states that he would like cancel the prescription because he gets his own as it is much cheaper for him doing it this way than going through his Insurance.

## 2024-07-13 NOTE — Telephone Encounter (Unsigned)
 Copied from CRM #[id number]. Topic: General - Other >> Jul 12, 2024  5:14 PM [name] wrote: Reason for CRM: Patient is calling in requesting to speak with [place].  Patient says the next time his provider send in his atorvastatin  (LIPITOR) 40 MG tablet [[id number]] could he notate to not ship it until patient calls for shipment. He says the pharmacy recommend he do that because he ends up with too much medication.

## 2024-07-17 DIAGNOSIS — H2512 Age-related nuclear cataract, left eye: Secondary | ICD-10-CM | POA: Diagnosis not present

## 2024-07-17 DIAGNOSIS — H34811 Central retinal vein occlusion, right eye, with macular edema: Secondary | ICD-10-CM | POA: Diagnosis not present

## 2024-07-17 DIAGNOSIS — H35352 Cystoid macular degeneration, left eye: Secondary | ICD-10-CM | POA: Diagnosis not present

## 2024-07-17 DIAGNOSIS — H35371 Puckering of macula, right eye: Secondary | ICD-10-CM | POA: Diagnosis not present

## 2024-07-17 DIAGNOSIS — H43822 Vitreomacular adhesion, left eye: Secondary | ICD-10-CM | POA: Diagnosis not present

## 2024-07-17 DIAGNOSIS — H353114 Nonexudative age-related macular degeneration, right eye, advanced atrophic with subfoveal involvement: Secondary | ICD-10-CM | POA: Diagnosis not present

## 2024-07-17 DIAGNOSIS — H353121 Nonexudative age-related macular degeneration, left eye, early dry stage: Secondary | ICD-10-CM | POA: Diagnosis not present

## 2024-07-18 ENCOUNTER — Ambulatory Visit: Payer: Self-pay | Admitting: Internal Medicine

## 2024-08-13 ENCOUNTER — Other Ambulatory Visit: Payer: Self-pay

## 2024-08-13 DIAGNOSIS — R7301 Impaired fasting glucose: Secondary | ICD-10-CM

## 2024-08-13 NOTE — Progress Notes (Signed)
   08/13/2024  Patient ID: Brent Espinoza, male   DOB: 1951/10/09, 73 y.o.   MRN: [id number]  Pharmacy Quality Measure Review  This patient is appearing on a report for being at risk of failing the Glycemic Status Assessment in Diabetes measure this calendar year.   Last documented A1c 6.1 on 06/28/24

## 2024-09-04 DIAGNOSIS — H35352 Cystoid macular degeneration, left eye: Secondary | ICD-10-CM | POA: Diagnosis not present

## 2024-09-04 DIAGNOSIS — H353114 Nonexudative age-related macular degeneration, right eye, advanced atrophic with subfoveal involvement: Secondary | ICD-10-CM | POA: Diagnosis not present

## 2024-09-04 DIAGNOSIS — H35371 Puckering of macula, right eye: Secondary | ICD-10-CM | POA: Diagnosis not present

## 2024-09-04 DIAGNOSIS — H353121 Nonexudative age-related macular degeneration, left eye, early dry stage: Secondary | ICD-10-CM | POA: Diagnosis not present

## 2024-09-04 DIAGNOSIS — H34811 Central retinal vein occlusion, right eye, with macular edema: Secondary | ICD-10-CM | POA: Diagnosis not present

## 2024-09-04 DIAGNOSIS — H2512 Age-related nuclear cataract, left eye: Secondary | ICD-10-CM | POA: Diagnosis not present

## 2024-09-04 DIAGNOSIS — H43822 Vitreomacular adhesion, left eye: Secondary | ICD-10-CM | POA: Diagnosis not present

## 2024-09-05 ENCOUNTER — Ambulatory Visit: Payer: Self-pay | Admitting: Medical

## 2024-09-05 ENCOUNTER — Ambulatory Visit: Attending: Cardiovascular Disease | Admitting: Cardiovascular Disease

## 2024-09-05 ENCOUNTER — Encounter: Payer: Self-pay | Admitting: Cardiovascular Disease

## 2024-09-05 VITALS — BP 122/78 | HR 73 | Ht 68.0 in | Wt 155.4 lb

## 2024-09-05 DIAGNOSIS — I2510 Atherosclerotic heart disease of native coronary artery without angina pectoris: Secondary | ICD-10-CM

## 2024-09-05 DIAGNOSIS — I739 Peripheral vascular disease, unspecified: Secondary | ICD-10-CM

## 2024-09-05 DIAGNOSIS — E782 Mixed hyperlipidemia: Secondary | ICD-10-CM | POA: Diagnosis not present

## 2024-09-05 DIAGNOSIS — Z720 Tobacco use: Secondary | ICD-10-CM | POA: Diagnosis not present

## 2024-09-05 DIAGNOSIS — R030 Elevated blood-pressure reading, without diagnosis of hypertension: Secondary | ICD-10-CM | POA: Diagnosis not present

## 2024-09-05 NOTE — Progress Notes (Signed)
 Cardiology Office Note   Date:  09/05/2024   ID:  Brent Espinoza, Brent Espinoza 23-Apr-1951, MRN [id number]  PCP:  Bulah Alm RAMAN, PA-C  Cardiologist:   Deatrice Cage, MD   No chief complaint on file.     History of Present Illness: Brent Espinoza is a 73 y.o. male who is here today for af follow up visit regarding PAD.  The patient has past medical history of tobacco use, type 2 diabetes and hyperlipidemia.  He smokes 1 pack/day and has been doing so since 73 years old.  He has no family history of coronary artery disease.  He also has known history of coronary artery calcifications noted on prior CT scan He has known history of peripheral arterial disease with mild bilateral calf claudication and erectile dysfunction.   He had noninvasive vascular studies done in 2019 which showed mildly reduced ABI bilaterally in the 0.7 range.  Aortoiliac duplex showed severe bilateral common iliac artery stenosis in addition to an occluded left SFA. The patient did not want to have procedures done and this was treated medically.   He had ultrasound done in September that showed no evidence of aortic aneurysm.  There was evidence of iliac artery disease.  He continues to smoke 1 pack/day.   He denies chest pain or worsening dyspnea.  He has a pulmonary nodule that is being followed.  He reports stable mild bilateral leg claudication with no rest pain or lower extremity ulceration.  Past Medical History:  Diagnosis Date   Abnormal CT of the chest 06/08/2018   Allergy    mild   Arthritis    Atherosclerosis of aorta 06/08/2018   Cancer (HCC) 2022   skin cancer   Cataract    removed right eye   Coronary artery disease involving native heart without angina pectoris 06/08/2018   Diabetes mellitus without complication (HCC)    pre diabetes   Erectile dysfunction 06/08/2018   Eye disease 02/18/2017   GERD (gastroesophageal reflux disease)    occ   HDL deficiency 02/19/2017    Hyperlipidemia    Impacted cerumen of right ear 02/18/2017   Leukocytosis 02/19/2017   Mixed dyslipidemia 02/19/2017   Overweight 02/18/2017   Smoking    Tobacco use 02/18/2017    Past Surgical History:  Procedure Laterality Date   COLONOSCOPY  07/2021   Dr. Leigh , repeat 5 years   EYE SURGERY     gets therapy injections for vessel disease, hx/o cataract surgery   MOUTH SURGERY       Current Outpatient Medications  Medication Sig Dispense Refill   aspirin  EC 81 MG tablet Take 1 tablet (81 mg total) by mouth daily. 90 tablet 3   atorvastatin  (LIPITOR) 40 MG tablet Take 1 tablet (40 mg total) by mouth daily. 90 tablet 3   Coenzyme Q10 (CO Q-10) 100 MG CAPS Take by mouth daily.     metFORMIN  (GLUCOPHAGE ) 500 MG tablet TAKE 1 TABLET EVERY DAY WITH BREAKFAST 90 tablet 3   naproxen sodium (ALEVE) 220 MG tablet Take 220 mg by mouth. Takes PRN     No current facility-administered medications for this visit.    Allergies:   Penicillins    Social History:  The patient  reports that he has been smoking cigarettes. He has a 51 pack-year smoking history. He has never used smokeless tobacco. He reports that he does not currently use alcohol. He reports that he does not use drugs.   Family History:  The patient's family history is negative for coronary artery disease, peripheral arterial disease or cancer.   ROS:  Please see the history of present illness.   Otherwise, review of systems are positive for none.   All other systems are reviewed and negative.    PHYSICAL EXAM: VS:  BP 122/78 (BP Location: Right Arm, Cuff Size: Normal)   Pulse 73   Ht 5' 8 (1.727 m)   Wt 155 lb 6.4 oz (70.5 kg)   SpO2 98%   BMI 23.63 kg/m  , BMI Body mass index is 23.63 kg/m. GEN: Well nourished, well developed, in no acute distress  HEENT: normal  Neck: no JVD, carotid bruits, or masses Cardiac: RRR; no murmurs, rubs, or gallops,no edema  Respiratory:  clear to auscultation bilaterally,  normal work of breathing GI: soft, nontender, nondistended, + BS MS: no deformity or atrophy  Skin: warm and dry, no rash Neuro:  Strength and sensation are intact Psych: euthymic mood, full affect    EKG:  EKG is ordered today. EKG showed: Sinus rhythm EKG WITHIN NORMAL LIMITS    Recent Labs: 06/28/2024: ALT 15; BUN 13; Creatinine, Ser 1.00; Hemoglobin 14.6; Platelets 267; Potassium 4.3; Sodium 136    Lipid Panel    Component Value Date/Time   CHOL 93 (L) 06/28/2024 1023   TRIG 72 06/28/2024 1023   HDL 27 (L) 06/28/2024 1023   CHOLHDL 3.4 06/28/2024 1023   CHOLHDL 5.4 (H) 02/18/2017 0939   VLDL 12 02/18/2017 0939   LDLCALC 51 06/28/2024 1023      Wt Readings from Last 3 Encounters:  09/05/24 155 lb 6.4 oz (70.5 kg)  06/28/24 158 lb 6.4 oz (71.8 kg)  06/08/24 157 lb 3.2 oz (71.3 kg)           No data to display            ASSESSMENT AND PLAN:  1.  Peripheral arterial disease: He reports stable moderate lower extremity claudication which is due to bilateral iliac disease.  In addition, he is known to have occluded left SFA.  He reports that his symptoms are not lifestyle limiting at the present, and wants to continue medical therapy.  2.  Tobacco use: I again discussed with him the importance of smoking cessation.  3.  Hyperlipidemia: Continue treatment with atorvastatin .  I reviewed his recent labs which showed an LDL of 51 which is at target.  4.  Coronary atherosclerosis noted on previous CT scan: Currently with no convincing symptoms of angina.  His EKG today is normal.  5.  Erectile dysfunction: Likely due to significant bilateral common iliac artery disease.  6.  White coat syndrome: His initial blood pressure was 160/80 but repeat was 122/78.  This is not unusual for him.  Continue to monitor for now.    Disposition:   FU with me in 12 months  Signed,  Deatrice Cage, MD  09/05/2024 10:53 AM    [facility]

## 2024-09-05 NOTE — Patient Instructions (Signed)
 Medication Instructions:  Your physician recommends that you continue on your current medications as directed. Please refer to the Current Medication list given to you today.  *If you need a refill on your cardiac medications before your next appointment, please call your pharmacy*  Lab Work: none If you have labs (blood work) drawn today and your tests are completely normal, you will receive your results only by: MyChart Message (if you have MyChart) OR A paper copy in the mail If you have any lab test that is abnormal or we need to change your treatment, we will call you to review the results.  Testing/Procedures: none  Follow-Up: At [facility], you and your health needs are our priority.  As part of our continuing mission to provide you with exceptional heart care, our providers are all part of one team.  This team includes your primary Cardiologist (physician) and Advanced Practice Providers or APPs (Physician Assistants and Nurse Practitioners) who all work together to provide you with the care you need, when you need it.  Your next appointment:   12 month(s)  Provider:   Dr Darron   We recommend signing up for the patient portal called MyChart.  Sign up information is provided on this After Visit Summary.  MyChart is used to connect with patients for Virtual Visits (Telemedicine).  Patients are able to view lab/test results, encounter notes, upcoming appointments, etc.  Non-urgent messages can be sent to your provider as well.   To learn more about what you can do with MyChart, go to [email or web address].   Other Instructions

## 2024-09-05 NOTE — Progress Notes (Signed)
Abstract eye exam

## 2024-09-06 NOTE — Progress Notes (Signed)
   09/06/2024  Patient ID: Brent Espinoza, male   DOB: 26-May-1951, 73 y.o.   MRN: [id number]  Pharmacy Quality Measure Review  This patient is appearing on a report for being at risk of failing the adherence measure for diabetes medications this calendar year.   Medication: Metformin  Last fill date: 08/30/24 for 90 day supply  Insurance report was not up to date. No action needed at this time.   Jon VEAR Lindau, PharmD Clinical Pharmacist [phone]

## 2024-09-25 ENCOUNTER — Encounter (HOSPITAL_BASED_OUTPATIENT_CLINIC_OR_DEPARTMENT_OTHER): Payer: Self-pay

## 2024-09-28 NOTE — Progress Notes (Signed)
 SHAKUR LEMBO                                          MRN: [id number]   09/28/2024   The [facility] Quality Team Specialist reviewed this patient medical record for the purposes of chart review for care gap closure. The following were reviewed: chart review for care gap closure-kidney health evaluation for diabetes:eGFR  and uACR.    [facility] Quality Team

## 2024-10-18 ENCOUNTER — Ambulatory Visit
Admission: RE | Admit: 2024-10-18 | Discharge: 2024-10-18 | Disposition: A | Discharge status: Home / Self Care | Source: Ambulatory Visit | Attending: Emergency Medicine | Admitting: Emergency Medicine

## 2024-10-18 DIAGNOSIS — R911 Solitary pulmonary nodule: Secondary | ICD-10-CM

## 2024-10-23 LAB — OPHTHALMOLOGY REPORT-SCANNED

## 2024-10-27 ENCOUNTER — Ambulatory Visit: Payer: Self-pay | Admitting: Emergency Medicine

## 2024-10-27 DIAGNOSIS — R911 Solitary pulmonary nodule: Secondary | ICD-10-CM

## 2024-10-30 NOTE — Telephone Encounter (Addendum)
 X1 VM/LM return call

## 2024-11-16 NOTE — Progress Notes (Signed)
 Atc x3. Left msg to call back regarding results. Sending Mychart msg.

## 2024-11-17 NOTE — Telephone Encounter (Signed)
 Left msg on pts vm that RB has ordered scan for July 2026, and to follow up after that is completed.

## 2024-11-17 NOTE — Telephone Encounter (Signed)
 Please of the patient know that I have ordered a CT chest for July 2026.  We can follow-up after that scan.

## 2024-11-17 NOTE — Addendum Note (Signed)
 Addended by: SHELAH LAMAR RAMAN on: 11/17/2024 12:41 PM   Modules accepted: Orders

## 2025-06-05 ENCOUNTER — Ambulatory Visit: Payer: Self-pay

## 2025-07-04 ENCOUNTER — Encounter: Payer: Self-pay | Admitting: Medical
